# Patient Record
Sex: Male | Born: 1960
Health system: Southern US, Community
[De-identification: ages and names within clinical notes are randomized; demographics above are authoritative.]

## PROBLEM LIST (undated history)

## (undated) DIAGNOSIS — M1990 Unspecified osteoarthritis, unspecified site: Secondary | ICD-10-CM

## (undated) DIAGNOSIS — R269 Unspecified abnormalities of gait and mobility: Secondary | ICD-10-CM

## (undated) DIAGNOSIS — I639 Cerebral infarction, unspecified: Secondary | ICD-10-CM

## (undated) DIAGNOSIS — E785 Hyperlipidemia, unspecified: Secondary | ICD-10-CM

## (undated) DIAGNOSIS — F419 Anxiety disorder, unspecified: Secondary | ICD-10-CM

## (undated) DIAGNOSIS — Z8619 Personal history of other infectious and parasitic diseases: Secondary | ICD-10-CM

## (undated) HISTORY — DX: Personal history of other infectious and parasitic diseases: Z86.19

## (undated) HISTORY — PX: OTHER SURGICAL HISTORY: SHX169

## (undated) HISTORY — DX: Unspecified abnormalities of gait and mobility: R26.9

## (undated) HISTORY — PX: INGUINAL HERNIA REPAIR: SUR1180

## (undated) HISTORY — DX: Unspecified osteoarthritis, unspecified site: M19.90

## (undated) HISTORY — DX: Hyperlipidemia, unspecified: E78.5

## (undated) HISTORY — DX: Anxiety disorder, unspecified: F41.9

## (undated) HISTORY — PX: COLONOSCOPY: SHX174

---

## 2005-03-27 ENCOUNTER — Emergency Department (HOSPITAL_COMMUNITY): Admission: EM | Admit: 2005-03-27 | Discharge: 2005-03-27 | Payer: Self-pay | Admitting: Emergency Medicine

## 2005-03-28 ENCOUNTER — Emergency Department (HOSPITAL_COMMUNITY): Admission: EM | Admit: 2005-03-28 | Discharge: 2005-03-28 | Payer: Self-pay | Admitting: Emergency Medicine

## 2005-03-31 ENCOUNTER — Inpatient Hospital Stay (HOSPITAL_COMMUNITY): Admission: RE | Admit: 2005-03-31 | Discharge: 2005-04-14 | Payer: Self-pay | Admitting: Neurosurgery

## 2005-03-31 ENCOUNTER — Encounter: Payer: Self-pay | Admitting: Internal Medicine

## 2005-03-31 ENCOUNTER — Ambulatory Visit: Payer: Self-pay | Admitting: Infectious Diseases

## 2005-03-31 ENCOUNTER — Ambulatory Visit: Payer: Self-pay | Admitting: Dentistry

## 2005-03-31 ENCOUNTER — Ambulatory Visit: Payer: Self-pay | Admitting: Physical Medicine & Rehabilitation

## 2005-04-01 ENCOUNTER — Encounter (INDEPENDENT_AMBULATORY_CARE_PROVIDER_SITE_OTHER): Payer: Self-pay | Admitting: Interventional Cardiology

## 2005-04-14 ENCOUNTER — Inpatient Hospital Stay (HOSPITAL_COMMUNITY)
Admission: RE | Admit: 2005-04-14 | Discharge: 2005-05-13 | Payer: Self-pay | Admitting: Physical Medicine & Rehabilitation

## 2005-05-17 ENCOUNTER — Encounter
Admission: RE | Admit: 2005-05-17 | Discharge: 2005-08-15 | Payer: Self-pay | Admitting: Physical Medicine & Rehabilitation

## 2005-05-18 ENCOUNTER — Ambulatory Visit: Payer: Self-pay | Admitting: Nurse Practitioner

## 2005-05-18 ENCOUNTER — Ambulatory Visit: Payer: Self-pay | Admitting: *Deleted

## 2005-06-13 ENCOUNTER — Ambulatory Visit (HOSPITAL_COMMUNITY): Admission: RE | Admit: 2005-06-13 | Discharge: 2005-06-13 | Payer: Self-pay | Admitting: Neurosurgery

## 2005-06-22 ENCOUNTER — Encounter
Admission: RE | Admit: 2005-06-22 | Discharge: 2005-09-20 | Payer: Self-pay | Admitting: Physical Medicine & Rehabilitation

## 2005-07-05 ENCOUNTER — Ambulatory Visit: Payer: Self-pay | Admitting: Physical Medicine & Rehabilitation

## 2005-08-16 ENCOUNTER — Encounter
Admission: RE | Admit: 2005-08-16 | Discharge: 2005-11-14 | Payer: Self-pay | Admitting: Physical Medicine & Rehabilitation

## 2005-08-31 ENCOUNTER — Ambulatory Visit: Payer: Self-pay | Admitting: Physical Medicine & Rehabilitation

## 2005-10-24 ENCOUNTER — Encounter
Admission: RE | Admit: 2005-10-24 | Discharge: 2006-01-22 | Payer: Self-pay | Admitting: Physical Medicine & Rehabilitation

## 2005-10-24 ENCOUNTER — Ambulatory Visit: Payer: Self-pay | Admitting: Physical Medicine & Rehabilitation

## 2005-12-30 ENCOUNTER — Ambulatory Visit: Payer: Self-pay | Admitting: Physical Medicine & Rehabilitation

## 2006-02-20 ENCOUNTER — Encounter
Admission: RE | Admit: 2006-02-20 | Discharge: 2006-05-21 | Payer: Self-pay | Admitting: Physical Medicine & Rehabilitation

## 2006-03-28 ENCOUNTER — Emergency Department (HOSPITAL_COMMUNITY): Admission: EM | Admit: 2006-03-28 | Discharge: 2006-03-28 | Payer: Self-pay | Admitting: Emergency Medicine

## 2006-04-06 ENCOUNTER — Ambulatory Visit: Payer: Self-pay | Admitting: Physical Medicine & Rehabilitation

## 2006-07-27 ENCOUNTER — Encounter
Admission: RE | Admit: 2006-07-27 | Discharge: 2006-10-25 | Payer: Self-pay | Admitting: Physical Medicine & Rehabilitation

## 2006-07-27 ENCOUNTER — Ambulatory Visit: Payer: Self-pay | Admitting: Physical Medicine & Rehabilitation

## 2006-08-31 ENCOUNTER — Emergency Department (HOSPITAL_COMMUNITY): Admission: EM | Admit: 2006-08-31 | Discharge: 2006-08-31 | Payer: Self-pay | Admitting: Emergency Medicine

## 2006-10-19 ENCOUNTER — Ambulatory Visit: Payer: Self-pay | Admitting: Physical Medicine & Rehabilitation

## 2006-12-04 ENCOUNTER — Emergency Department (HOSPITAL_COMMUNITY): Admission: EM | Admit: 2006-12-04 | Discharge: 2006-12-04 | Payer: Self-pay | Admitting: Emergency Medicine

## 2007-02-06 ENCOUNTER — Ambulatory Visit: Payer: Self-pay | Admitting: Physical Medicine & Rehabilitation

## 2007-02-06 ENCOUNTER — Encounter
Admission: RE | Admit: 2007-02-06 | Discharge: 2007-02-07 | Payer: Self-pay | Admitting: Physical Medicine & Rehabilitation

## 2007-05-29 ENCOUNTER — Encounter
Admission: RE | Admit: 2007-05-29 | Discharge: 2007-05-30 | Payer: Self-pay | Admitting: Physical Medicine & Rehabilitation

## 2007-05-29 ENCOUNTER — Ambulatory Visit: Payer: Self-pay | Admitting: Physical Medicine & Rehabilitation

## 2007-09-17 ENCOUNTER — Encounter
Admission: RE | Admit: 2007-09-17 | Discharge: 2007-09-19 | Payer: Self-pay | Admitting: Physical Medicine & Rehabilitation

## 2007-09-19 ENCOUNTER — Ambulatory Visit: Payer: Self-pay | Admitting: Physical Medicine & Rehabilitation

## 2007-11-03 ENCOUNTER — Emergency Department (HOSPITAL_COMMUNITY): Admission: EM | Admit: 2007-11-03 | Discharge: 2007-11-03 | Payer: Self-pay | Admitting: Emergency Medicine

## 2007-12-27 ENCOUNTER — Encounter
Admission: RE | Admit: 2007-12-27 | Discharge: 2007-12-28 | Payer: Self-pay | Admitting: Physical Medicine & Rehabilitation

## 2007-12-28 ENCOUNTER — Ambulatory Visit: Payer: Self-pay | Admitting: Physical Medicine & Rehabilitation

## 2008-04-23 ENCOUNTER — Emergency Department (HOSPITAL_COMMUNITY): Admission: EM | Admit: 2008-04-23 | Discharge: 2008-04-23 | Payer: Self-pay | Admitting: Emergency Medicine

## 2008-05-16 ENCOUNTER — Encounter: Admission: RE | Admit: 2008-05-16 | Discharge: 2008-05-20 | Payer: Self-pay | Admitting: Anesthesiology

## 2008-05-20 ENCOUNTER — Ambulatory Visit: Payer: Self-pay | Admitting: Anesthesiology

## 2008-05-27 ENCOUNTER — Ambulatory Visit (HOSPITAL_COMMUNITY): Admission: RE | Admit: 2008-05-27 | Discharge: 2008-05-27 | Payer: Self-pay | Admitting: Anesthesiology

## 2008-08-20 ENCOUNTER — Emergency Department (HOSPITAL_COMMUNITY): Admission: EM | Admit: 2008-08-20 | Discharge: 2008-08-20 | Payer: Self-pay | Admitting: Emergency Medicine

## 2008-08-22 ENCOUNTER — Emergency Department (HOSPITAL_COMMUNITY): Admission: EM | Admit: 2008-08-22 | Discharge: 2008-08-22 | Payer: Self-pay | Admitting: Family Medicine

## 2008-08-27 ENCOUNTER — Encounter: Admission: RE | Admit: 2008-08-27 | Discharge: 2008-09-30 | Payer: Self-pay | Admitting: Anesthesiology

## 2008-11-15 ENCOUNTER — Emergency Department (HOSPITAL_COMMUNITY): Admission: EM | Admit: 2008-11-15 | Discharge: 2008-11-15 | Payer: Self-pay | Admitting: Emergency Medicine

## 2008-12-03 ENCOUNTER — Encounter: Admission: RE | Admit: 2008-12-03 | Discharge: 2008-12-03 | Payer: Self-pay | Admitting: Neurosurgery

## 2008-12-14 ENCOUNTER — Emergency Department (HOSPITAL_COMMUNITY): Admission: EM | Admit: 2008-12-14 | Discharge: 2008-12-14 | Payer: Self-pay | Admitting: Emergency Medicine

## 2009-02-25 ENCOUNTER — Emergency Department (HOSPITAL_COMMUNITY): Admission: EM | Admit: 2009-02-25 | Discharge: 2009-02-25 | Payer: Self-pay | Admitting: Emergency Medicine

## 2009-04-04 ENCOUNTER — Emergency Department (HOSPITAL_COMMUNITY): Admission: EM | Admit: 2009-04-04 | Discharge: 2009-04-04 | Payer: Self-pay | Admitting: Emergency Medicine

## 2009-04-22 ENCOUNTER — Emergency Department (HOSPITAL_COMMUNITY): Admission: EM | Admit: 2009-04-22 | Discharge: 2009-04-22 | Payer: Self-pay | Admitting: Emergency Medicine

## 2009-07-22 ENCOUNTER — Emergency Department (HOSPITAL_COMMUNITY): Admission: EM | Admit: 2009-07-22 | Discharge: 2009-07-22 | Payer: Self-pay | Admitting: Emergency Medicine

## 2009-12-20 ENCOUNTER — Emergency Department (HOSPITAL_COMMUNITY): Admission: EM | Admit: 2009-12-20 | Discharge: 2009-12-20 | Payer: Self-pay | Admitting: Emergency Medicine

## 2010-01-05 ENCOUNTER — Ambulatory Visit: Payer: Self-pay | Admitting: Diagnostic Radiology

## 2010-01-05 ENCOUNTER — Emergency Department (HOSPITAL_BASED_OUTPATIENT_CLINIC_OR_DEPARTMENT_OTHER)
Admission: EM | Admit: 2010-01-05 | Discharge: 2010-01-05 | Payer: Self-pay | Source: Home / Self Care | Admitting: Emergency Medicine

## 2010-02-16 ENCOUNTER — Encounter
Admission: RE | Admit: 2010-02-16 | Discharge: 2010-03-18 | Payer: Self-pay | Source: Home / Self Care | Attending: Neurosurgery | Admitting: Neurosurgery

## 2010-02-27 ENCOUNTER — Emergency Department (HOSPITAL_BASED_OUTPATIENT_CLINIC_OR_DEPARTMENT_OTHER): Admission: EM | Admit: 2010-02-27 | Discharge: 2010-02-27 | Payer: Self-pay | Admitting: Emergency Medicine

## 2010-03-24 ENCOUNTER — Encounter
Admission: RE | Admit: 2010-03-24 | Discharge: 2010-03-26 | Payer: Self-pay | Source: Home / Self Care | Attending: Neurosurgery | Admitting: Neurosurgery

## 2010-03-26 ENCOUNTER — Emergency Department (HOSPITAL_BASED_OUTPATIENT_CLINIC_OR_DEPARTMENT_OTHER)
Admission: EM | Admit: 2010-03-26 | Discharge: 2010-03-26 | Payer: Self-pay | Source: Home / Self Care | Admitting: Emergency Medicine

## 2010-03-29 ENCOUNTER — Encounter
Admission: RE | Admit: 2010-03-29 | Discharge: 2010-04-27 | Payer: Self-pay | Source: Home / Self Care | Attending: Neurosurgery | Admitting: Neurosurgery

## 2010-03-31 ENCOUNTER — Encounter
Admission: RE | Admit: 2010-03-31 | Discharge: 2010-03-31 | Payer: Self-pay | Source: Home / Self Care | Attending: Neurosurgery | Admitting: Neurosurgery

## 2010-04-18 ENCOUNTER — Encounter: Payer: Self-pay | Admitting: Neurosurgery

## 2010-04-18 ENCOUNTER — Encounter: Payer: Self-pay | Admitting: Physical Medicine & Rehabilitation

## 2010-06-09 DIAGNOSIS — G8190 Hemiplegia, unspecified affecting unspecified side: Secondary | ICD-10-CM | POA: Insufficient documentation

## 2010-06-10 DIAGNOSIS — N319 Neuromuscular dysfunction of bladder, unspecified: Secondary | ICD-10-CM | POA: Insufficient documentation

## 2010-06-10 DIAGNOSIS — R32 Unspecified urinary incontinence: Secondary | ICD-10-CM | POA: Insufficient documentation

## 2010-07-06 LAB — GLUCOSE, CAPILLARY: Glucose-Capillary: 110 mg/dL — ABNORMAL HIGH (ref 70–99)

## 2010-07-07 DIAGNOSIS — N529 Male erectile dysfunction, unspecified: Secondary | ICD-10-CM | POA: Insufficient documentation

## 2010-07-07 DIAGNOSIS — L84 Corns and callosities: Secondary | ICD-10-CM | POA: Insufficient documentation

## 2010-08-10 NOTE — Assessment & Plan Note (Signed)
[name] returns to the clinic today for followup evaluation. He  reports that he is not getting much help from the Flexeril use three  times per day. He has occasional severe spasms in his right leg which  causes him to occasionally fall. He would like to try a different muscle  relaxant. In terms of his tingling in his hands, that sensation has  stopped and he has stopped taking his Neurontin completely.   He does need a refill on his hydrocodone over the next few days.   REVIEW OF SYSTEMS:  Noncontributory.   MEDICATIONS:  1. Flexeril 10 mg t.i.d. p.r.n.  2. Hydrocodone 7.5/325 one tablet t.i.d. p.r.n.   PHYSICAL EXAMINATION:  Well-appearing, fit, adult male in mild acute  discomfort. He ambulates with a single point cane in his right upper  extremity. Blood pressure is 137/63 with a pulse of 88. Respiratory rate  is 16. O2 saturation is 99% on room air. He has 4+/5 strength throughout  the bilateral upper and lower extremities.   IMPRESSION:  1. Status post cervical abscess along with discitis and cervical      myelopathy, status post evacuation of epidural abscess C6-C7      January 2007.  2. Persistent left upper and left lower extremity weakness/spasticity,      improved.  3. Pain of the right ankle and right great toe, improved.   In the office today, we did refill the patient's hydrocodone as of the  22 of November. We will also start him on Robaxin 500 mg t.i.d. p.r.n.  in place of his Flexeril. No refill on the Neurontin is necessary as he  has stopped that medication completely. Will plan on seeing the patient  in followup in approximately four months time with refills prior to that  appointment as necessary.           ______________________________  Ellwood Dense, M.D.     DC/MedQ  D:  02/07/2007 09:17:51  T:  02/07/2007 14:47:03  Job #:  [id number]

## 2010-08-10 NOTE — Assessment & Plan Note (Signed)
Danny Hopkins returns to the clinic today for followup evaluation.  He  reports that he still has no primary care physician.  He has only  Medicaid, is not sure who would accept his insurance.  We have been  using pain medicines for his chronic low back pain.  He still uses the  hydrocodone, approximately 2 to 3 tablets per day, along with Robaxin on  an as-needed basis.  Does complain of increased tingling of his left  hand and left leg and lower leg on the left side.  He had been on  Neurontin in the past and would like to restart that medication for the  increased tingling.  He still reports that he gets stiff whenever he  sits for a prolonged period of time.  He does not need a refill on the  Robaxin or hydrocodone, but does need a new prescription for the  Neurontin.   REVIEW OF SYSTEMS:  Positive for constipation and night sweats.   MEDICATIONS:  1. Robaxin p.r.n.  2. Hydrocodone 7.5/325 one tablet t.i.d. p.r.n.   PHYSICAL EXAMINATION:  A reasonably well-appearing, fit adult male in  mild acute discomfort.  Blood pressure is 105/69 with a pulse of 61, respiratory rate 18, and O2  saturation 98% on room air.  He ambulates with an antalgic gait with stiffness in his low back.  He has 4+/5 strength throughout the bilateral upper and lower  extremities.   IMPRESSION:  1. Status post cervical abscess along with diskitis and cervical      myelopathy, status post evacuation of epidural abscess, C6-C7,      January 2007.  2. Persistent left upper and left lower extremity      weakness/spasticity/tingling.  3. Pain at the right ankle and right great toe, improved.   In the office today, no refill on the hydrocodone or Robaxin is  necessary.  We did restart him on Neurontin 100 mg b.i.d. with a new  prescription with 3 refills.  We will plan on seeing him in followup in  approximately 3 months' time and adjust his Neurontin at that time.  I  still have encouraged him to call around  to see who would be able to  follow his primary care needs, now and in the future.  He will need to  let them know that he has Medicaid before they can set up an  appointment.           ______________________________  Ellwood Dense, M.D.     DC/MedQ  D:  05/30/2007 09:26:49  T:  05/30/2007 10:24:25  Job #:  [id number]

## 2010-08-10 NOTE — Assessment & Plan Note (Signed)
Danny Hopkins returns to clinic today for followup evaluation.  He  reports that he has had some increased tingling of his finger and toes.  He apparently has stopped the Neurontin as he did not get a refill on  that medication.  He had been taking 300 mg t.i.d. but is not taking the  medication at all at this point.  He does report that he gets reasonable  relief from the hydrocodone used 3 times per day.  He does need a refill  on the hydrocodone and a restart of the Neurontin.   REVIEW OF SYSTEMS:  Noncontributory.   MEDICATIONS:  1. Flexeril 10 mg p.o. t.i.d.  2. Hydrocodone 7.5/325 one tablet t.i.d.  p.r.n.   PHYSICAL EXAMINATION:  A well-appearing adult male in mild to no acute  discomfort.  Blood pressure 123/82 with a pulse of 77. Respiratory rate 17 and O2  saturation 98% on room air.  The patient is 4+/5 strength at the bilateral, upper and lower  extremities.   IMPRESSION:  1. Status post cervical abscess along with diskitis and cervical      myelopathy, status post evacuation of epidural abscess C6-03 April 2005.  2. Persistent left upper and left lower extremity weakness/spasticity,      improving.  3. Pain in the right ankle and left great toe.   In the office today, we did refill the patient's hydrocodone.  We also  restarted him on Neurontin 300 mg 1 tablet p.o. nightly for 5 days, then  1 p.o. b.i.d.  for 5 days, then back to the maintenance level of 1  tablet p.o. t.i.d. for a total of 900 mg daily.  We will plan on seeing  the patient in follow up in this office in approximately 3 to 4 months'  time with refills prior to that appointment as necessary.           ______________________________  Danny Hopkins, M.D.     DC/MedQ  D:  10/20/2006 09:44:20  T:  10/20/2006 15:05:22  Job #:  [id number]

## 2010-08-10 NOTE — Assessment & Plan Note (Signed)
HISTORY:  Mr. Haden returns to the clinic today for followup  evaluation.  He reports that he is doing well overall.  He continues to  use minimal amounts of hydrocodone, i.e., 0-3 tablets per day and  Robaxin, the same, 0-3 tablets per day.  He does have a sufficient  supply of the hydrocodone and Robaxin in the office today.  He does  request a prescription of Cialis for erectile dysfunction.  We had  previously prescribed 10-mg tablets and he had to use up to 2 to get any  response.  He would like to have increased dose if possible.   MEDICATIONS:  1. Robaxin 500 mg t.i.d. p.r.n.  2. Hydrocodone 7.5/325 one tablet t.i.d. p.r.n. (0-3 per day).  3. Cialis p.r.n.   REVIEW OF SYSTEMS:  Positive for erectile dysfunction.   PHYSICAL EXAMINATION:  GENERAL:  Well-appearing fit adult male in mild-  to-no acute discomfort.  VITAL SIGNS:  Blood pressure 115/64 with a pulse of 60, respiratory rate  18, and O2 saturation 96% on room air.  MUSCULOSKELETAL:  The patient has 4+ to 5-/5 strength throughout the  bilateral upper and lower extremities.  He ambulates without any  assistive device.  He has decreased range of motion of his lumbar and  cervical spine.   IMPRESSION:  1. Status post cervical abscess along with diskitis and cervical      myelopathy, status post evacuation of epidural abscess C6-C7,      January 2007.  2. Persistent left upper and left lower extremity      weakness/spasticity/tingling.  3. Right ankle and right great toe pain, improved.   In the office today, no refill on the Robaxin or hydrocodone is  necessary.  We did give him a prescription for Cialis to be used 20 mg 1  tablet daily p.r.n., a total of 10, as of January 07, 2008.  We will  plan on seeing the patient in followup in this office in approximately 4  months' time with refills prior to that appointment as necessary.           ______________________________  Ellwood Dense, M.D.     DC/MedQ  D:   12/28/2007 12:13:14  T:  12/29/2007 01:30:27  Job #:  [id number]

## 2010-08-10 NOTE — Assessment & Plan Note (Signed)
Danny [facility] comes to [facility] today.  I evaluated him  and reviewed the health and history form and 14-point review of systems,  reviewed the chart.   1. Progress to date overall directed care approach.  An individual      with longstanding pain, secondary to cord compression, he has upper      arm and left with muscle wasting, decreased grip, and progressive      and emerging low back pain described as spondylitic, he has an      evolving radicular element.  He has not had an MRI of his low back.  2. Modifiable features in health profile discussed, full informed      consent UDS, review medication and progress to date.  3. His medication is reviewed, and I have gone over with him with      legitimate medical need assessed.  He is using hydrocodone      sparingly, and I have reviewed that medication with him.  Perhaps      with interventions and multimodality approach we can consider      moving to a p.r.n., nonnarcotic options, and he is open to that.   OBJECTIVE:  Diffuse paralumbar myofascial discomfort.  Fortin test  positive.  Side bending range of motion impaired secondary to pain.  His  left upper extremity reveals decreased grip strength.   There is some forearm wasting and he has thenar evidence of radial  myelopathy, suprascapular, midscapular pain.  Pain with extension, side  bending in the lumbar region.  Straight leg right and left forward test  positive.  Particularly to the right.  The EHL is fine.  He has brisk  reflex, nothing new neurologically.   IMPRESSION:  Degenerative spine disease, lumbar spine with radicular  symptoms.   PLAN:  1. Lumbar MRI.  2. Consider interventions.  3. Opioid consent.  The patient's care agreement reviewed.  I will go      ahead and put him on hydrocodone.  4. Questions were answered, discussed in lay terms and I will see him      in followup.            ______________________________  Celene Kras, MD     HH/MedQ  D:  05/20/2008 17:21:04  T:  05/21/2008 04:46:25  Job #:  [id number]

## 2010-08-10 NOTE — Assessment & Plan Note (Signed)
Danny Hopkins returns to the clinic today for followup evaluation.  He  reports that he continues to use his hydrocodone anywhere from 0-3  tablets per day.  He has had a recent refill on that medication.  He  also has a sufficient supply of Robaxin at this point.  He continues to  try to be as active as possible at home and also periodically doing  things with his nephew.   The patient is requesting medication for erectile dysfunction.  He  reports that that has been a problem that he is noticed over the past  several months, and this seems to have worsened.   REVIEW OF SYSTEMS:  Positive for erectile dysfunction.   MEDICATIONS:  1. Robaxin 500 mg t.i.d. p.r.n.  2. Hydrocodone 7.5/325 one tablet t.i.d. p.r.n. (0-3 per day).   PHYSICAL EXAMINATION:  GENERAL:  Well-appearing, fit, adult male, in  mild-to-no acute discomfort.  VITAL SIGNS:  Blood pressure 107/68 with the pulse of 59, respiratory  rate 18, and O2 saturation 99% on room air.  EXTREMITIES:  He has 4+ to 5-/5 strength throughout the bilateral upper  and lower extremities.  He ambulates without any assistive device.   IMPRESSION:  1. Status post cervical abscess along with discitis and cervical      myelopathy, status post evacuation of epidural abscess C6-C7 in      January 2007.  2. Persistent left upper and left lower extremity      weakness/spasticity/tingling.  3. Pain in the right ankle and right great toe, improved.   No refill on the Robaxin or hydrocodone is necessary.  We did give him a  new prescription for Cialis 10 mg to be used 1-2 tablets p.o. daily  p.r.n. total of 20 with 1 refill.  We will plan on seeing him in  followup at approximately 4 months' time with refills prior to that  appointment as necessary.           ______________________________  Danny Hopkins, M.D.     DC/MedQ  D:  09/19/2007 09:52:01  T:  09/20/2007 06:20:30  Job #:  [id number]

## 2010-08-13 NOTE — Discharge Summary (Signed)
NAMEGEREMY, RISTER            ACCOUNT NO.:  [id number]   MEDICAL RECORD NO.:  [id number]          PATIENT TYPE:  INP   LOCATION:  3038                         FACILITY:  [facility]   PHYSICIAN:  Kathaleen Maser. Pool, M.D.    DATE OF BIRTH:  July 11, 1960   DATE OF ADMISSION:  03/31/2005  DATE OF DISCHARGE:  04/14/2005                                 DISCHARGE SUMMARY   SERVICE:  Neurosurgery.   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   OPERATION/TREATMENT:  1.  C6 corpectomy with C5-C7 strut graft fusion with instrumentation.  2.  IV antibiotics.   HISTORY OF PRESENT ILLNESS:  Mr. Peretti is a 50 year old male who has  severe neck pain and progressive quadriparesis.  A workup had demonstrated  what appears to be a spontaneous epidural hematoma, although abscess can not  be ruled out.  The patient has marked spinal cord dysfunction and is taken  to the operating room urgently for decompression.   HOSPITAL COURSE:  The patient underwent a C6 corpectomy with fusion from C5-  C7.  Postoperatively, the patient awakened with a further decline in his  neurological function.  He no longer had spontaneous movement of his lower  extremities.  Upper extremity strength was essentially stable.  The patient  was treated with antibiotics.  Cultures grew staph aureus sensitive to  methicillin.  He was placed on Ancef for this.  Followup MRI scanning and CT  scanning demonstrated good decompression of the spinal cord with no evidence  of further problems.  The patient gradually improved.  He began having  spontaneous movements in his lower extremities, right greater than left.  He  progressed to antigravity strength.  He was making good gains with physical  therapy.  He is to be discharged to the rehab medical service.   CONDITION ON DISCHARGE:  Improving.   DISCHARGE DISPOSITION:  The patient is to remain in an external brace.  He  is to continue  with IV antibiotics.  I will continue to follow him while in  the rehab unit.           ______________________________  Kathaleen Maser. Pool, M.D.     HAP/MEDQ  D:  07/26/2005  T:  07/26/2005  Job:  [id number]
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
## 2010-08-13 NOTE — Assessment & Plan Note (Signed)
HISTORY OF PRESENT ILLNESS:  Mr. Ozaki returns to the clinic today for  followup evaluation. Overall, he is doing well. He continues to attend  outpatient physical and occupational therapy at [facility] twice a week.  He is ambulating with a single point cane.   The main problems that he reports are involving this bowel and bladder and  pain. He reports tightness in his low back that has been steady over the  past 3 weeks or so. He is not getting much relief from the Baclofen. He also  reports that he has had occasional incontinence of bladder and reports that  he has constant pressure in his lower abdomen. He also reports that he has  occasional acute loss of continence of bowel but with bowel movements  occurring only every 3 to 4 days.   MEDICATIONS:  1.  Baclofen 10 mg t.i.d.  2.  Tylenol p.r.n.   REVIEW OF SYSTEMS:  Noncontributory.   PHYSICAL EXAMINATION:  GENERAL:  A reasonably well appearing thin adult male  in mild acute discomfort.  VITAL SIGNS:  Blood pressure 116/57 with a pulse of 58, respiratory rate 16,  and O2 saturation 99% on room air.  NEUROLOGIC:  Right upper and left upper extremity strength were 4+ and 3+  over 5, respectively. Right lower extremity strength was 5 minus over 5 and  left lower extremity strength was 4 minus over 5 in hip flexion. He  ambulates with a single point cane. Bulk and tone were normal.   IMPRESSION:  1.  Status post cervical abscess along with diskitis and cervical myelopathy      with evacuation of epidural abscess in C6-7  fusion March 31, 2005.  2.  Persistent left upper and left lower extremity weakness.  3.  Spasms of upper abdomen and low back periodically.   PLAN:  1.  For his low back pain, we have recommended that he stop his Baclofen and      instead, use the Robaxin 500 mg 1 tablet p.o. t.i.d., written for him in      the office today.  2.  In terms of his bladder, we have written for Ditropan XL 4 mg 1 tablet    daily, to see if we can help with his constant pressure to urinate.  3.  For his bowels, I have given him a script for Senokot-S, which is a      laxative that he can get over-the-counter. He can follow the      instructions on the Senokot packaging.  4.  We will plan on seeing the patient in followup in this office in      approximately 2 months time with refills of medications prior to that      appointment as necessary. He will continue in outpatient therapy at this      point with likely good progress to be made.           ______________________________  Ellwood Dense, M.D.     DC/MedQ  D:  09/01/2005 10:33:25  T:  09/01/2005 22:05:54  Job #:  [id number]

## 2010-08-13 NOTE — Assessment & Plan Note (Signed)
Danny Hopkins returns to clinic today slightly earlier than previously  scheduled.  He is reporting a fair amount of pain in his back and hips and  legs, and he feels it may be related to some of the weather changes.  He has  been using the Robaxin but not getting much relief.  He did try a recent  tablet of Vicodin from a friend of his and reports that he got a good bit of  relief.  He is wondering if he could use that on a minimal daily basis for  his pain.  He also plans to start living up in [place] for the next month  or so and plans to return to the local area over the next several weeks.   MEDICATIONS:  Robaxin 500 mg t.i.d. p.r.n.   REVIEW OF SYSTEMS:  Positive for urine retention.   PHYSICAL EXAMINATION:  GENERAL:  A reasonably well-appearing thin adult male  in mild acute discomfort.  VITAL SIGNS:  Blood pressure 114/80 with pulse 65, respiratory rate 16, and  O2 saturation 99% on room air.  MUSCULOSKELETAL/NEUROLOGIC:  He has 4+ to 5-/5 strength throughout the  bilateral upper and lower extremities.  He ambulates without any assistive  device.   IMPRESSION:  1. Status post cervical abscess along with discitis and cervical      myelopathy, status post evacuation of epidural abscess and effusion at      C6-03 April 2005.  2. Persistent left upper and left lower extremity weakness/spasticity,      improved.  3. Bladder incontinence, resolved.   In the office today we did have him discontinue his Robaxin and start  Vicodin 5/325 mg one tablet t.i.d. p.r.n., a total of 90 were prescribed.  He will be using minimal amounts of this and has been compliant with all  medicines in the past.  I expect that we will have no problems with him on a  narcotic medication.  We will plan to see him in follow-up in approximately  3 months' time with refills prior to that appointment as necessary.           ______________________________  Ellwood Dense, M.D.     DC/MedQ  D:   01/02/2006 10:58:52  T:  01/03/2006 11:22:26  Job #:  [id number]
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
## 2010-08-13 NOTE — Assessment & Plan Note (Signed)
[name] returns to clinic today for follow-up evaluation.  He is  reporting that the change from baclofen to Robaxin is giving him reasonable  relief from his spasms.  He had also used the Ditropan-XL for a short period  of time for bladder incontinence, but has been able to control that  completely off the medicine.  He also stopped laxatives.   The patient has been applying for disability, but has been turned down x2.  He is working with the lawyer at the present time.   MEDICATIONS:  Robaxin 500 mg t.i.d. p.r.n.   REVIEW OF SYSTEMS:  Noncontributory.   PHYSICAL EXAMINATION:  GENERAL:  A reasonably well-appearing thin adult male  in mild acute discomfort.  VITAL SIGNS:  Blood pressure of 98/68, with a pulse of 68, respiratory rate  16, and O2 saturation 95% on room air.  MUSCULOSKELETAL:  Right upper and left upper extremity strength were 4+ and  3+ over 5, respectively.  The right lower extremity strength was 5-/5, and  the left lower extremity strength was 4-/5.  He ambulates with a single-  point cane.   IMPRESSION:  1.  Status post cervical abscess, along with discitis and cervical      myelopathy, with evacuation of epidural abscess and fusion at C6-7, in      January of 2007.  2.  Persistent left upper and left lower extremity weakness/spasticity.  3.  Bladder incontinence, resolved.   In the office today, no refill on medications is necessary.  He continues to  try to be as active as possible, using a single-point cane for ambulation.  He is still in the process of trying to apply for disability.  Will plan on  seeing the patient in follow up in approximately 3-4 months' time.           ______________________________  Ellwood Dense, M.D.     DC/MedQ  D:  10/27/2005 11:18:30  T:  10/27/2005 11:44:03  Job #:  [id number]

## 2010-08-13 NOTE — Op Note (Signed)
Danny Hopkins, Danny Hopkins            ACCOUNT NO.:  [id number]   MEDICAL RECORD NO.:  [id number]          PATIENT TYPE:  INP   LOCATION:  2852                         FACILITY:  [facility]   PHYSICIAN:  Kathaleen Maser. Pool, M.D.    DATE OF BIRTH:  1960/08/16   DATE OF PROCEDURE:  03/31/2005  DATE OF DISCHARGE:                                 OPERATIVE REPORT   PREOPERATIVE DIAGNOSES:  Left C5-6, acute cervical disk herniation with  myelopathy and associated epidural hematoma.   POSTOPERATIVE DIAGNOSES:  C5-6 discitis with epidural abscess and severe  myelopathy.   PROCEDURE:  1.  C6 corpectomy with evacuation of epidural abscess.  2.  C5-C7 strut graft fusion utilizing fibular allograft and anterior plate      instrumentation.   SURGEON:  Kathaleen Maser. Pool, M.D.   ASSISTANT:  Tia Alert, MD   ANESTHESIA:  General endotracheal.   INDICATIONS FOR PROCEDURE:  Danny Hopkins is a 50 year old black male with a  5-day history of severe neck pain with radiation into his left scapula and  progressively into his left upper extremity.  The patient was evaluated a  number of times at the [facility] Emergency Room where workup for cause of  his problems had been nondiagnostic.  The patient presented late last night  with continued complaints of pain and now with worsening numbness and  paresthesias extending to his left upper extremity, and now involving his  left lower extremity.  Progressively through the course of the night the  patient became severe weak in his left upper extremity and now no longer has  voluntary motor function in his left lower extremity.  He had minimal  antigravity strength in his right lower extremity.  An urgent MRI scan this  morning was done which demonstrated evidence of a left-sided C5-6 extradural  mass with severe spinal cord compression consistent with an acute cervical  disk herniation.  There was high signal tracking behind the body of C6 which  appeared to be most  consistent with an associated epidural hematoma.  The  patient was counseled as to his options. I have recommended that we proceed  emergently with a C-6 corpectomy for evacuation of this lesion.  The  patient, otherwise, is in good health; has no history of trauma.   OPERATIVE NOTE:  The patient is brought to the operating room and placed on  the table in the supine position. After an adequate level of anesthesia was  achieved, the patient was placed supine with his neck slightly extended and  held in place with the halter traction.  The patient's anterior cervical  region was prepped and draped sterilely. A #10 blade was used to make a  linear skin incision overlying the C6 vertebral level.  This was carried  down sharply to the platysma.  The platysma was then divided vertically and  dissection proceeded along the medial border of the sternocleidomastoid  muscle and carotid sheath.  The trachea and esophagus were mobilized and  retracted towards the left.   The longus coli muscles, themselves, were quite edematous and the anterior  longitudinal  ligament was also thickened.  The disk space at C5-6 was  disrupted.  The disk space at C6-7 was also identified.  Both disk spaces  were then incised with a 15-blade in a standard fashion and a wide disk  space plane was then achieved using pituitary rongeurs, upward angled and  backward angle Carlen curets, Kerrison rongeurs and a high-speed drill.  Microscope was brought into the field.  Starting first at the C5-6 the  remaining aspects of osteophytes and annulus were removed down to the level  of the posterior longitudinal ligament.  Posterior ligament was then  elevated and resected in piece meal fashion using Kerrison rongeurs.  The  posterior longitudinal ligament was very reactive and inflamed.  Pus was  encountered more off towards the left side.  This was cultured.  A wide  discectomy was then performed extending into each neural  foramen.  The C6  nerve roots were identified proximally and found to be free of any further  compression.  All elements of the abscess at this level was drained.  A  diskectomy at C6-7 was then performed in a similar fashion.  A corpectomy of  the body of C6 was then performed using Weckcel rongeurs and the high-speed  drill.  The remaining posterior cortex of bone was then removed using  Kerrison rongeurs.  The reactive posterior longitudinal ligament was also  removed.  The dura, itself, was thickened and somewhat hyperemic.   The wound was then copiously irrigated with antibiotic solution. A small  amount of Gelfoam was placed topically for hemostasis.  A piece of fibular  strut allograft was then cut to an appropriate length and then impacted into  place with the corpectomy site distracted using Caspar pins.  A 45-mm  Atlantis anterior cervical plate was then placed over the C5 and C7 bodies.  This was then attached under fluoroscopic guidance using 13-mm variable  angle screws, two each at both levels.  Final tightening was given to all  four screws.  A locking screw was engaged at all three levels.  The wound  was then irrigated with antibiotic solution.  Final images revealed good  position of bone grafts and hardware at the proper operative level with  normal alignment of the spine.  The wound was inspected for hemostasis.  It  was then closed in typical fashion.  Steri-Strips and a sterile dressing  were applied. The patient returns to the recovery room in very serious  condition.           ______________________________  Kathaleen Maser Pool, M.D.     HAP/MEDQ  D:  03/31/2005  T:  03/31/2005  Job:  [id number]
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
## 2010-08-13 NOTE — Assessment & Plan Note (Signed)
FOLLOW-UP OFFICE NOTE   SUBJECTIVE:  [name] returns to clinic today for followup  evaluation.  He reports that occasionally whenever he is seated or lying  for a prolonged period of time, he has some weakness in his right leg  when he first gets up.  That seems to improve after just a few steps but  the weakness that he has is associated with pain across the top of  his  right ankle.  Once he is moving, the pain resolves and his strength is  fairly normal.  He does use a single point cane because he has had  recent near falls related to this pain and weakness.  He continues to  take his Neurontin along with his Flexeril and Vicodin.  He reports that  he gets a lot of help with the addition of the recent Vicodin.   MEDICATIONS:  1. Neurontin 300 mg t.i.d.  2. Flexeril 10 mg t.i.d. p.r.n.  3. Vicodin 7.5/325 one tablet t.i.d. p.r.n.   REVIEW OF SYSTEMS:  Positive for constipation.   PHYSICAL EXAMINATION:  GENERAL:  Well-appearing, thin, adult male in  mild acute discomfort.  VITAL SIGNS:  Blood pressure 119/76, pulse 80, respiratory rate 16, O2  saturation 100% on room air.  EXTREMITIES:  Patient has 4/5 strength throughout the left upper and  left lower extremity.  Right-sided strength was 4+/5.   IMPRESSION:  1. Status post cervical abscess along with diskitis and cervical      myelopathy, status post evacuation of epidural abscess C6-7 in      January of 2007.  2. Persistent left upper and left lower extremity weakness/spasticity,      improving.  3. Bladder incontinence, resolved.  4. Pain of the right ankle.  5. Pain of the left great toe.   PLAN:  In the office today, we did refill the patient's hydrocodone.  He  has a sufficient supply of Flexeril and Neurontin at this time.  Will  plan on seeing the patient in followup in approximately three months  time with refill of medication prior to this appointment if necessary.           ______________________________  Ellwood Dense, M.D.     DC/MedQ  D:  07/28/2006 09:30:09  T:  07/28/2006 10:15:53  Job #:  [id number]

## 2010-08-13 NOTE — Assessment & Plan Note (Signed)
Danny Hopkins returns to the clinic ahead of schedule for evaluation of  his left great toe.  He apparently developed spontaneous onset of severe  pain with burning of his left great toe and partial second toe.  He went  in to the emergency room March 28, 2006, and was prescribed Neurontin  and Flexeril.  Those both seemed to be giving him some relief, as he  feels that he can walk on it better, but he still has significant limp.  The tenderness is much less severe.  He has never had a particular  diagnosis of gout, and the joint is not swollen.  It is tender to touch,  but that has improved over the past few days.   MEDICATIONS:  1. Neurontin 300 mg b.i.d.  2. Flexeril 10 mg t.i.d. p.r.n.   REVIEW OF SYSTEMS:  Positive for constipation and night sweats.   PHYSICAL EXAMINATION:  Well-appearing, thin adult male, in moderate-to-  acute discomfort involving his left great toe.  Blood pressure was 131/74, pulse 70, respiratory rate 16, and O2  saturation 100% on room air.  Examination of his left great toe shows no particular swelling.  It is  tender to light touch, but I am able to at least touch it.  He reports  that was impossible back at the early part of the year.  He is able to  put weight on it, but uses a bedroom slipper to walk.   IMPRESSION:  1. Status post cervical abscess along with diskitis and cervical      myelopathy, status post evacuation of epidural abscess and      effusions C6-7, January, 2007.  2. Persistent left upper and left lower extremity weakness/spasticity,      improved.  3. Bladder incontinence, resolved.  4. Pain of the left great toe.   In the office today, we did refill the patient's Flexeril and increased  his Neurontin up to 300 mg t.i.d.  We will see how that goes over the  next few weeks, but it seems to be improving on it's own.  There is no  sign of any particular swelling, nor is there any redness.  We will plan  on seeing the patient in  followup in approximately 3-4 months' time.  He  is interested in restarting outpatient therapies, and we will let his  toe heal up before we start the therapy.  He does have some constipation  problems in the office today, and that should respond with over-the-  counter laxatives.           ______________________________  Ellwood Dense, M.D.     DC/MedQ  D:  04/07/2006 10:38:42  T:  04/07/2006 11:30:39  Job #:  [id number]

## 2011-04-07 ENCOUNTER — Other Ambulatory Visit (HOSPITAL_COMMUNITY): Payer: Self-pay | Admitting: Neurosurgery

## 2011-04-07 DIAGNOSIS — M4802 Spinal stenosis, cervical region: Secondary | ICD-10-CM | POA: Diagnosis not present

## 2011-04-07 DIAGNOSIS — M542 Cervicalgia: Secondary | ICD-10-CM

## 2011-04-11 ENCOUNTER — Ambulatory Visit (HOSPITAL_COMMUNITY)
Admission: RE | Admit: 2011-04-11 | Discharge: 2011-04-11 | Disposition: A | Discharge status: Home / Self Care | Payer: Medicare Other | Source: Ambulatory Visit | Attending: Neurosurgery | Admitting: Neurosurgery

## 2011-04-11 DIAGNOSIS — G9589 Other specified diseases of spinal cord: Secondary | ICD-10-CM | POA: Insufficient documentation

## 2011-04-11 DIAGNOSIS — R209 Unspecified disturbances of skin sensation: Secondary | ICD-10-CM | POA: Insufficient documentation

## 2011-04-11 DIAGNOSIS — M542 Cervicalgia: Secondary | ICD-10-CM

## 2011-04-11 DIAGNOSIS — B958 Unspecified staphylococcus as the cause of diseases classified elsewhere: Secondary | ICD-10-CM | POA: Diagnosis not present

## 2011-04-22 DIAGNOSIS — M4712 Other spondylosis with myelopathy, cervical region: Secondary | ICD-10-CM | POA: Diagnosis not present

## 2011-06-09 DIAGNOSIS — N529 Male erectile dysfunction, unspecified: Secondary | ICD-10-CM | POA: Diagnosis not present

## 2011-06-09 DIAGNOSIS — F329 Major depressive disorder, single episode, unspecified: Secondary | ICD-10-CM | POA: Diagnosis not present

## 2011-06-09 DIAGNOSIS — G894 Chronic pain syndrome: Secondary | ICD-10-CM | POA: Diagnosis not present

## 2011-06-09 DIAGNOSIS — E559 Vitamin D deficiency, unspecified: Secondary | ICD-10-CM | POA: Diagnosis not present

## 2011-06-22 DIAGNOSIS — M4802 Spinal stenosis, cervical region: Secondary | ICD-10-CM | POA: Diagnosis not present

## 2011-07-04 DIAGNOSIS — G5620 Lesion of ulnar nerve, unspecified upper limb: Secondary | ICD-10-CM | POA: Diagnosis not present

## 2011-07-04 DIAGNOSIS — M5412 Radiculopathy, cervical region: Secondary | ICD-10-CM | POA: Diagnosis not present

## 2011-07-13 ENCOUNTER — Emergency Department (HOSPITAL_COMMUNITY): Payer: Medicare Other

## 2011-07-13 ENCOUNTER — Encounter (HOSPITAL_COMMUNITY): Payer: Self-pay

## 2011-07-13 ENCOUNTER — Emergency Department (HOSPITAL_COMMUNITY)
Admission: EM | Admit: 2011-07-13 | Discharge: 2011-07-13 | Disposition: A | Discharge status: Home / Self Care | Payer: Medicare Other | Attending: Emergency Medicine | Admitting: Emergency Medicine

## 2011-07-13 DIAGNOSIS — R220 Localized swelling, mass and lump, head: Secondary | ICD-10-CM | POA: Diagnosis not present

## 2011-07-13 DIAGNOSIS — Y92410 Unspecified street and highway as the place of occurrence of the external cause: Secondary | ICD-10-CM | POA: Insufficient documentation

## 2011-07-13 DIAGNOSIS — R221 Localized swelling, mass and lump, neck: Secondary | ICD-10-CM | POA: Diagnosis not present

## 2011-07-13 DIAGNOSIS — S0003XA Contusion of scalp, initial encounter: Secondary | ICD-10-CM | POA: Diagnosis not present

## 2011-07-13 DIAGNOSIS — R51 Headache: Secondary | ICD-10-CM | POA: Diagnosis not present

## 2011-07-13 DIAGNOSIS — S0093XA Contusion of unspecified part of head, initial encounter: Secondary | ICD-10-CM

## 2011-07-13 DIAGNOSIS — S1093XA Contusion of unspecified part of neck, initial encounter: Secondary | ICD-10-CM | POA: Diagnosis not present

## 2011-07-13 DIAGNOSIS — T1490XA Injury, unspecified, initial encounter: Secondary | ICD-10-CM | POA: Diagnosis not present

## 2011-07-13 HISTORY — DX: Cerebral infarction, unspecified: I63.9

## 2011-07-13 MED ORDER — HYDROCODONE-ACETAMINOPHEN 5-325 MG PO TABS: 2.0000 | ORAL_TABLET | ORAL | Status: AC | PRN
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
## 2011-07-13 NOTE — Discharge Instructions (Signed)
Contusion  A contusion is a deep bruise. Contusions are the result of an injury that caused bleeding under the skin. The contusion may turn blue, purple, or yellow. Minor injuries will give you a painless contusion, but more severe contusions may stay painful and swollen for a few weeks.   CAUSES   A contusion is usually caused by a blow, trauma, or direct force to an area of the body.  SYMPTOMS    Swelling and redness of the injured area.   Bruising of the injured area.   Tenderness and soreness of the injured area.   Pain.  DIAGNOSIS   The diagnosis can be made by taking a history and physical exam. An X-ray, CT scan, or MRI may be needed to determine if there were any associated injuries, such as fractures.  TREATMENT   Specific treatment will depend on what area of the body was injured. In general, the best treatment for a contusion is resting, icing, elevating, and applying cold compresses to the injured area. Over-the-counter medicines may also be recommended for pain control. Ask your caregiver what the best treatment is for your contusion.  HOME CARE INSTRUCTIONS    Put ice on the injured area.   Put ice in a plastic bag.   Place a towel between your skin and the bag.   Leave the ice on for 15 to 20 minutes, 3 to 4 times a day.   Only take over-the-counter or prescription medicines for pain, discomfort, or fever as directed by your caregiver. Your caregiver may recommend avoiding anti-inflammatory medicines (aspirin, ibuprofen, and naproxen) for 48 hours because these medicines may increase bruising.   Rest the injured area.   If possible, elevate the injured area to reduce swelling.  SEEK IMMEDIATE MEDICAL CARE IF:    You have increased bruising or swelling.   You have pain that is getting worse.   Your swelling or pain is not relieved with medicines.  MAKE SURE YOU:    Understand these instructions.   Will watch your condition.   Will get help right away if you are not doing well or get  worse.  Document Released: 12/22/2004 Document Revised: 03/03/2011 Document Reviewed: 01/17/2011  [employer] Patient Information 2012 [employer]  Motor Vehicle Collision   It is common to have multiple bruises and sore muscles after a motor vehicle collision (MVC). These tend to feel worse for the first 24 hours. You may have the most stiffness and soreness over the first several hours. You may also feel worse when you wake up the first morning after your collision. After this point, you will usually begin to improve with each day. The speed of improvement often depends on the severity of the collision, the number of injuries, and the location and nature of these injuries.  HOME CARE INSTRUCTIONS    Put ice on the injured area.   Put ice in a plastic bag.   Place a towel between your skin and the bag.   Leave the ice on for 15 to 20 minutes, 3 to 4 times a day.   Drink enough fluids to keep your urine clear or pale yellow. Do not drink alcohol.   Take a warm shower or bath once or twice a day. This will increase blood flow to sore muscles.   You may return to activities as directed by your caregiver. Be careful when lifting, as this may aggravate neck or back pain.   Only take over-the-counter or prescription medicines   for pain, discomfort, or fever as directed by your caregiver. Do not use aspirin. This may increase bruising and bleeding.  SEEK IMMEDIATE MEDICAL CARE IF:   You have numbness, tingling, or weakness in the arms or legs.   You develop severe headaches not relieved with medicine.   You have severe neck pain, especially tenderness in the middle of the back of your neck.   You have changes in bowel or bladder control.   There is increasing pain in any area of the body.   You have shortness of breath, lightheadedness, dizziness, or fainting.   You have chest pain.   You feel sick to your stomach (nauseous), throw up (vomit), or sweat.   You have increasing abdominal  discomfort.   There is blood in your urine, stool, or vomit.   You have pain in your shoulder (shoulder strap areas).   You feel your symptoms are getting worse.  MAKE SURE YOU:    Understand these instructions.   Will watch your condition.   Will get help right away if you are not doing well or get worse.  Document Released: 03/14/2005 Document Revised: 03/03/2011 Document Reviewed: 08/11/2010  [employer] Patient Information 2012 [employer]

## 2011-07-13 NOTE — ED Notes (Signed)
Pt. D/C home. Pt. Is ambulatory. A.O. X 4 NAD. Prescription and discharge teaching in hand.

## 2011-07-13 NOTE — ED Notes (Signed)
LSB and C-Collar removed by EDP Radford Pax
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
## 2011-07-14 DIAGNOSIS — M4802 Spinal stenosis, cervical region: Secondary | ICD-10-CM | POA: Diagnosis not present

## 2011-09-01 DIAGNOSIS — M4802 Spinal stenosis, cervical region: Secondary | ICD-10-CM | POA: Diagnosis not present

## 2011-11-16 DIAGNOSIS — M4802 Spinal stenosis, cervical region: Secondary | ICD-10-CM | POA: Diagnosis not present

## 2011-11-16 DIAGNOSIS — M48061 Spinal stenosis, lumbar region without neurogenic claudication: Secondary | ICD-10-CM | POA: Diagnosis not present

## 2011-12-06 DIAGNOSIS — R209 Unspecified disturbances of skin sensation: Secondary | ICD-10-CM | POA: Diagnosis not present

## 2011-12-06 DIAGNOSIS — M79609 Pain in unspecified limb: Secondary | ICD-10-CM | POA: Diagnosis not present

## 2011-12-07 DIAGNOSIS — R5383 Other fatigue: Secondary | ICD-10-CM | POA: Diagnosis not present

## 2011-12-07 DIAGNOSIS — R5381 Other malaise: Secondary | ICD-10-CM | POA: Diagnosis not present

## 2011-12-07 DIAGNOSIS — Z1211 Encounter for screening for malignant neoplasm of colon: Secondary | ICD-10-CM | POA: Diagnosis not present

## 2011-12-07 DIAGNOSIS — Z136 Encounter for screening for cardiovascular disorders: Secondary | ICD-10-CM | POA: Diagnosis not present

## 2011-12-07 DIAGNOSIS — Z0000 Encounter for general adult medical examination without abnormal findings: Secondary | ICD-10-CM | POA: Diagnosis not present

## 2011-12-07 DIAGNOSIS — F329 Major depressive disorder, single episode, unspecified: Secondary | ICD-10-CM | POA: Diagnosis not present

## 2011-12-07 DIAGNOSIS — N529 Male erectile dysfunction, unspecified: Secondary | ICD-10-CM | POA: Diagnosis not present

## 2011-12-15 DIAGNOSIS — M79609 Pain in unspecified limb: Secondary | ICD-10-CM | POA: Diagnosis not present

## 2012-02-02 DIAGNOSIS — Z23 Encounter for immunization: Secondary | ICD-10-CM | POA: Diagnosis not present

## 2012-06-05 DIAGNOSIS — M5000 Cervical disc disorder with myelopathy, unspecified cervical region: Secondary | ICD-10-CM | POA: Diagnosis not present

## 2012-06-05 DIAGNOSIS — M4802 Spinal stenosis, cervical region: Secondary | ICD-10-CM | POA: Diagnosis not present

## 2012-10-03 DIAGNOSIS — M4712 Other spondylosis with myelopathy, cervical region: Secondary | ICD-10-CM | POA: Diagnosis not present

## 2012-10-12 ENCOUNTER — Ambulatory Visit (INDEPENDENT_AMBULATORY_CARE_PROVIDER_SITE_OTHER): Payer: Medicare Other | Admitting: Internal Medicine

## 2012-10-12 ENCOUNTER — Encounter: Payer: Self-pay | Admitting: Internal Medicine

## 2012-10-12 VITALS — BP 108/72 | HR 52 | Temp 98.0°F | Ht 67.0 in | Wt 143.0 lb

## 2012-10-12 DIAGNOSIS — G4700 Insomnia, unspecified: Secondary | ICD-10-CM | POA: Diagnosis not present

## 2012-10-12 DIAGNOSIS — F411 Generalized anxiety disorder: Secondary | ICD-10-CM | POA: Insufficient documentation

## 2012-10-12 DIAGNOSIS — L259 Unspecified contact dermatitis, unspecified cause: Secondary | ICD-10-CM | POA: Diagnosis not present

## 2012-10-12 DIAGNOSIS — Z8673 Personal history of transient ischemic attack (TIA), and cerebral infarction without residual deficits: Secondary | ICD-10-CM

## 2012-10-12 DIAGNOSIS — L309 Dermatitis, unspecified: Secondary | ICD-10-CM | POA: Insufficient documentation

## 2012-10-12 MED ORDER — CLOTRIMAZOLE-BETAMETHASONE 1-0.05 % EX CREA: TOPICAL_CREAM | Freq: Two times a day (BID) | CUTANEOUS | Status: DC

## 2012-10-12 MED ORDER — ALPRAZOLAM 1 MG PO TABS: 1.0000 mg | ORAL_TABLET | Freq: Three times a day (TID) | ORAL | Status: DC | PRN

## 2012-10-12 NOTE — Progress Notes (Signed)
HPI  Pt presents to the clinic today to establish care. He is transferring care from Dr. Ewell Poe today. He does need refills of his medications today. Other than that, he has no concerns.  Flu: yearly Tetanus: within last 10 years PSA: done yearly Colonoscopy: never Eye doctor: as needed Dentist: as needed  Past Medical History  Diagnosis Date  . Stroke   . Arthritis   . History of chicken pox     Current Outpatient Prescriptions  Medication Sig Dispense Refill  . ALPRAZolam (XANAX) 1 MG tablet Take 1 mg by mouth 3 (three) times daily as needed for sleep or anxiety.      . baclofen (LIORESAL) 10 MG tablet Take 10 mg by mouth 2 (two) times daily.       Marland Kitchen HYDROcodone-acetaminophen (NORCO) 10-325 MG per tablet Take 1 tablet by mouth every 8 (eight) hours as needed.       . clonazePAM (KLONOPIN) 0.5 MG tablet Take 0.5 mg by mouth 3 (three) times daily as needed.        No current facility-administered medications for this visit.    No Known Allergies  Family History  Problem Relation Age of Onset  . Family history unknown: Yes    History   Social History  . Marital Status: Single    Spouse Name: N/A    Number of Children: N/A  . Years of Education: 12   Occupational History  . DISABILITY    Social History Main Topics  . Smoking status: Never Smoker   . Smokeless tobacco: Not on file  . Alcohol Use: 1.2 oz/week    2 Cans of beer per week  . Drug Use: Yes    Special: Marijuana  . Sexually Active: Not Currently   Other Topics Concern  . Not on file   Social History Narrative   Regular exercise-no   Caffeine Use-no          ROS:  Constitutional: Denies fever, malaise, fatigue, headache or abrupt weight changes.  HEENT: Denies eye pain, eye redness, ear pain, ringing in the ears, wax buildup, runny nose, nasal congestion, bloody nose, or sore throat. Respiratory: Denies difficulty breathing, shortness of breath, cough or sputum production.    Cardiovascular: Denies chest pain, chest tightness, palpitations or swelling in the hands or feet.  Gastrointestinal: Denies abdominal pain, bloating, constipation, diarrhea or blood in the stool.  GU: Pt reports hesitancy. Denies frequency, urgency, pain with urination, blood in urine, odor or discharge. Musculoskeletal: Pt reports right hip pain and difficulty with gait secondary to stroke. Denies decrease in range of motion, muscle pain or joint pain and swelling.  Skin: Denies redness, rashes, lesions or ulcercations.  Neurological: Denies dizziness, difficulty with memory, difficulty with speech or problems with balance and coordination.   No other specific complaints in a complete review of systems (except as listed in HPI above).  PE:  BP 108/72  Pulse 52  Temp(Src) 98 F (36.7 C) (Oral)  Ht 5\' 7"  (1.702 m)  Wt 143 lb (64.864 kg)  BMI 22.39 kg/m2  SpO2 98% Wt Readings from Last 3 Encounters:  10/12/12 143 lb (64.864 kg)    General: Appears his stated age, well developed, well nourished in NAD. HEENT: Head: normal shape and size; Eyes: sclera white, no icterus, conjunctiva pink, PERRLA and EOMs intact; Ears: Tm's gray and intact, normal light reflex; Nose: mucosa pink and moist, septum midline; Throat/Mouth: Teeth present, mucosa pink and moist, no lesions or ulcerations noted.  Neck: Normal range of motion. Neck supple, trachea midline. No massses, lumps or thyromegaly present.  Cardiovascular: Normal rate and rhythm. S1,S2 noted.  No murmur, rubs or gallops noted. No JVD or BLE edema. No carotid bruits noted. Pulmonary/Chest: Normal effort and positive vesicular breath sounds. No respiratory distress. No wheezes, rales or ronchi noted.  Abdomen: Soft and nontender. Normal bowel sounds, no bruits noted. No distention or masses noted. Liver, spleen and kidneys non palpable. Strength L>R. Musculoskeletal: Normal range of motion. No signs of joint swelling. Mild difficulty with  gait, does not use assistive device.  Neurological: Alert and oriented. Cranial nerves II-XII intact. Coordination normal. +DTRs bilaterally. Psychiatric: Mood and affect normal. Behavior is normal. Judgment and thought content normal.     Assessment and Plan:  Health Maintenance:  Will schedule colonoscopy Encouraged pt to visit eye doctor and dentis on a more regular basis Will obtain records from previous provider to check immunizations status and review most recent labs

## 2012-10-12 NOTE — Assessment & Plan Note (Signed)
With right side hemiparesis BP well controlled

## 2012-10-12 NOTE — Patient Instructions (Signed)
Health Maintenance, Males A healthy lifestyle and preventative care can promote health and wellness.  Maintain regular health, dental, and eye exams.  Eat a healthy diet. Foods like vegetables, fruits, whole grains, low-fat dairy products, and lean protein foods contain the nutrients you need without too many calories. Decrease your intake of foods high in solid fats, added sugars, and salt. Get information about a proper diet from your caregiver, if necessary.  Regular physical exercise is one of the most important things you can do for your health. Most adults should get at least 150 minutes of moderate-intensity exercise (any activity that increases your heart rate and causes you to sweat) each week. In addition, most adults need muscle-strengthening exercises on 2 or more days a week.   Maintain a healthy weight. The body mass index (BMI) is a screening tool to identify possible weight problems. It provides an estimate of body fat based on height and weight. Your caregiver can help determine your BMI, and can help you achieve or maintain a healthy weight. For adults 20 years and older:  A BMI below 18.5 is considered underweight.  A BMI of 18.5 to 24.9 is normal.  A BMI of 25 to 29.9 is considered overweight.  A BMI of 30 and above is considered obese.  Maintain normal blood lipids and cholesterol by exercising and minimizing your intake of saturated fat. Eat a balanced diet with plenty of fruits and vegetables. Blood tests for lipids and cholesterol should begin at age 20 and be repeated every 5 years. If your lipid or cholesterol levels are high, you are over 50, or you are a high risk for heart disease, you may need your cholesterol levels checked more frequently.Ongoing high lipid and cholesterol levels should be treated with medicines, if diet and exercise are not effective.  If you smoke, find out from your caregiver how to quit. If you do not use tobacco, do not start.  If you  choose to drink alcohol, do not exceed 2 drinks per day. One drink is considered to be 12 ounces (355 mL) of beer, 5 ounces (148 mL) of wine, or 1.5 ounces (44 mL) of liquor.  Avoid use of street drugs. Do not share needles with anyone. Ask for help if you need support or instructions about stopping the use of drugs.  High blood pressure causes heart disease and increases the risk of stroke. Blood pressure should be checked at least every 1 to 2 years. Ongoing high blood pressure should be treated with medicines if weight loss and exercise are not effective.  If you are 45 to 50 years old, ask your caregiver if you should take aspirin to prevent heart disease.  Diabetes screening involves taking a blood sample to check your fasting blood sugar level. This should be done once every 3 years, after age 45, if you are within normal weight and without risk factors for diabetes. Testing should be considered at a younger age or be carried out more frequently if you are overweight and have at least 1 risk factor for diabetes.  Colorectal cancer can be detected and often prevented. Most routine colorectal cancer screening begins at the age of 50 and continues through age 75. However, your caregiver may recommend screening at an earlier age if you have risk factors for colon cancer. On a yearly basis, your caregiver may provide home test kits to check for hidden blood in the stool. Use of a small camera at the end of a tube,   to directly examine the colon (sigmoidoscopy or colonoscopy), can detect the earliest forms of colorectal cancer. Talk to your caregiver about this at age 50, when routine screening begins. Direct examination of the colon should be repeated every 5 to 10 years through age 75, unless early forms of pre-cancerous polyps or small growths are found.  Hepatitis C blood testing is recommended for all people born from 1945 through 1965 and any individual with known risks for hepatitis C.  Healthy  men should no longer receive prostate-specific antigen (PSA) blood tests as part of routine cancer screening. Consult with your caregiver about prostate cancer screening.  Testicular cancer screening is not recommended for adolescents or adult males who have no symptoms. Screening includes self-exam, caregiver exam, and other screening tests. Consult with your caregiver about any symptoms you have or any concerns you have about testicular cancer.  Practice safe sex. Use condoms and avoid high-risk sexual practices to reduce the spread of sexually transmitted infections (STIs).  Use sunscreen with a sun protection factor (SPF) of 30 or greater. Apply sunscreen liberally and repeatedly throughout the day. You should seek shade when your shadow is shorter than you. Protect yourself by wearing long sleeves, pants, a wide-brimmed hat, and sunglasses year round, whenever you are outdoors.  Notify your caregiver of new moles or changes in moles, especially if there is a change in shape or color. Also notify your caregiver if a mole is larger than the size of a pencil eraser.  A one-time screening for abdominal aortic aneurysm (AAA) and surgical repair of large AAAs by sound wave imaging (ultrasonography) is recommended for ages 65 to 75 years who are current or former smokers.  Stay current with your immunizations. Document Released: 09/10/2007 Document Revised: 06/06/2011 Document Reviewed: 08/09/2010 [employer] Patient Information 2014 [employer].  

## 2012-10-12 NOTE — Assessment & Plan Note (Signed)
Switched clonazepam to xanax, no early refills Consider CBT

## 2012-10-12 NOTE — Assessment & Plan Note (Signed)
Try to avoid hot showers Rub a moisturizing lotion on your skin daily Refilled cream

## 2012-10-12 NOTE — Assessment & Plan Note (Signed)
Xanax as needed at night

## 2012-10-15 ENCOUNTER — Telehealth: Payer: Self-pay | Admitting: *Deleted

## 2012-10-15 NOTE — Telephone Encounter (Signed)
Spoke with pt, advised him of regina's message.

## 2012-10-15 NOTE — Telephone Encounter (Signed)
Pt called states his Pain management is currently maintained by Dr Lelon Perla.  Pt is requesting his pain management to be transferred to [facility].  Please advise

## 2012-10-15 NOTE — Telephone Encounter (Signed)
I do not do chronic pain management

## 2012-10-16 ENCOUNTER — Telehealth: Payer: Self-pay | Admitting: *Deleted

## 2012-10-16 NOTE — Telephone Encounter (Signed)
noted 

## 2012-10-16 NOTE — Telephone Encounter (Signed)
[facility] called states Dr Elizabeth Palau is not at their office.  States pt has not been a pt at their practice.  Further states Dr Elizabeth Palau is at [facility] and was at [facility] previously.

## 2012-11-19 ENCOUNTER — Other Ambulatory Visit: Payer: Self-pay | Admitting: Internal Medicine

## 2012-12-19 ENCOUNTER — Other Ambulatory Visit: Payer: Self-pay

## 2012-12-19 ENCOUNTER — Other Ambulatory Visit: Payer: Self-pay | Admitting: Internal Medicine

## 2012-12-19 DIAGNOSIS — G4700 Insomnia, unspecified: Secondary | ICD-10-CM

## 2012-12-19 DIAGNOSIS — F411 Generalized anxiety disorder: Secondary | ICD-10-CM

## 2012-12-19 MED ORDER — ALPRAZOLAM 1 MG PO TABS: ORAL_TABLET | ORAL | Status: DC

## 2012-12-19 NOTE — Telephone Encounter (Signed)
Can you call this in for me since I am out of the office today? Thx!

## 2012-12-19 NOTE — Telephone Encounter (Signed)
Called in refill for xanax per [facility]...ds,cma

## 2013-01-14 ENCOUNTER — Other Ambulatory Visit: Payer: Self-pay | Admitting: Internal Medicine

## 2013-01-30 DIAGNOSIS — M4712 Other spondylosis with myelopathy, cervical region: Secondary | ICD-10-CM | POA: Diagnosis not present

## 2013-01-31 DIAGNOSIS — Z23 Encounter for immunization: Secondary | ICD-10-CM | POA: Diagnosis not present

## 2013-02-13 ENCOUNTER — Other Ambulatory Visit: Payer: Self-pay | Admitting: Internal Medicine

## 2013-02-13 NOTE — Telephone Encounter (Signed)
Ok to phone in.

## 2013-02-13 NOTE — Telephone Encounter (Signed)
Phoned in to pharmacy. 

## 2013-03-14 ENCOUNTER — Other Ambulatory Visit: Payer: Self-pay | Admitting: Internal Medicine

## 2013-03-14 NOTE — Telephone Encounter (Signed)
Rx called in to pharmacy. 

## 2013-03-14 NOTE — Telephone Encounter (Signed)
Ok to phone in xanax 

## 2013-04-15 ENCOUNTER — Telehealth: Payer: Self-pay | Admitting: *Deleted

## 2013-04-15 ENCOUNTER — Other Ambulatory Visit: Payer: Self-pay | Admitting: *Deleted

## 2013-04-15 MED ORDER — ALPRAZOLAM 1 MG PO TABS: ORAL_TABLET | ORAL | Status: DC

## 2013-04-15 NOTE — Telephone Encounter (Signed)
Give him a 30 day supply. If he wants to continue to get refills, he will need to come here to set up with [facility]

## 2013-04-15 NOTE — Telephone Encounter (Signed)
Pt left message requesting cb on status of xanax refill.

## 2013-04-15 NOTE — Telephone Encounter (Signed)
See telephone note.  Medication phoned to pharmacy.

## 2013-04-15 NOTE — Telephone Encounter (Signed)
Medication phoned to pharmacy. Patient advised. Patient states he will not be able to come to this office because he is disabled and [place] ride [place] [employer] [place].  He will phone the [facility] office to try to establish with someone there.

## 2013-04-15 NOTE — Telephone Encounter (Signed)
Patient phoned requesting refill for his xanax.  Last OV with PCP 10/12/2012.

## 2013-04-16 NOTE — Telephone Encounter (Signed)
[facility] stated that they did not receive Rx refill via msg. I called in Rx again today

## 2013-04-19 ENCOUNTER — Ambulatory Visit (INDEPENDENT_AMBULATORY_CARE_PROVIDER_SITE_OTHER): Payer: Medicare Other | Admitting: Physician Assistant

## 2013-04-19 ENCOUNTER — Encounter: Payer: Self-pay | Admitting: Physician Assistant

## 2013-04-19 ENCOUNTER — Other Ambulatory Visit (INDEPENDENT_AMBULATORY_CARE_PROVIDER_SITE_OTHER): Payer: Medicare Other

## 2013-04-19 VITALS — BP 118/60 | HR 97 | Temp 97.5°F | Resp 18 | Ht 67.0 in | Wt 150.8 lb

## 2013-04-19 DIAGNOSIS — N401 Enlarged prostate with lower urinary tract symptoms: Secondary | ICD-10-CM | POA: Diagnosis not present

## 2013-04-19 DIAGNOSIS — N139 Obstructive and reflux uropathy, unspecified: Secondary | ICD-10-CM

## 2013-04-19 DIAGNOSIS — R399 Unspecified symptoms and signs involving the genitourinary system: Secondary | ICD-10-CM

## 2013-04-19 DIAGNOSIS — N138 Other obstructive and reflux uropathy: Secondary | ICD-10-CM

## 2013-04-19 DIAGNOSIS — R3989 Other symptoms and signs involving the genitourinary system: Secondary | ICD-10-CM

## 2013-04-19 DIAGNOSIS — Z125 Encounter for screening for malignant neoplasm of prostate: Secondary | ICD-10-CM

## 2013-04-19 LAB — URINALYSIS, ROUTINE W REFLEX MICROSCOPIC
Bilirubin Urine: NEGATIVE
Hgb urine dipstick: NEGATIVE
Ketones, ur: NEGATIVE
LEUKOCYTES UA: NEGATIVE
Nitrite: NEGATIVE
TOTAL PROTEIN, URINE-UPE24: NEGATIVE
URINE GLUCOSE: NEGATIVE
Urobilinogen, UA: 0.2 (ref 0.0–1.0)
pH: 6 (ref 5.0–8.0)

## 2013-04-19 LAB — PSA: PSA: 0.47 ng/mL (ref 0.10–4.00)

## 2013-04-19 NOTE — Patient Instructions (Signed)
It was nice to meet you today!  Please take medication as instructed.  Return to office in 30 days for re-Prostate-Specific Antigen The prostate-specific antigen (PSA) is a blood test. It is used to help detect early forms of prostate cancer. The test is usually used along with other tests. The test is also used to follow the course of those who already have prostate cancer or who have been treated for prostate cancer. Some factors interfere with the results of the PSA. The factors listed below will either increase or decrease the PSA levels. They are:  Prescriptions used for male baldness.  Some herbs.  Active prostate infection.  Prior instrumentation or urinary catheterization.  Ejaculation up to 2 days prior to testing.  A noncancerous enlargement of the prostate.  Inflammation of the prostate.  Active urinary tract infection. If your test results are elevated, your caregiver will discuss the results with you. Your caregiver will also let you know if more evaluation is needed. PREPARATION FOR TEST No preparation or fasting is necessary. NORMAL FINDINGS Less than 4 ng/mL or Less than 63mcg/L (SI units) Ranges for normal findings may vary among different laboratories and hospitals. You should always check with your caregiver after having lab work or other tests done to discuss the meaning of your test results and whether your values are considered within normal limits. MEANING OF TEST  A normal value means prostate cancer is less likely. The chance of having prostate cancer increases if the value is between 4 ng/mL and 10 ng/mL. However, further testing will be needed. Values above 10 ng/mL indicate that there is a much higher chance of having prostate cancer (if the above situations that raise PSA are not present). Your caregiver will go over your test results with you and discuss the importance of this test. If this value is elevated, your caregiver may recommend further testing or  evaluation. OBTAINING THE TEST RESULTS It is your responsibility to obtain your test results. Ask the lab or department performing the test when and how you will get your results. Document Released: 04/16/2004 Document Revised: 06/06/2011 Document Reviewed: 10/20/2006 [facility] Patient Information 2014 [employer]. eavaluation

## 2013-04-19 NOTE — Progress Notes (Signed)
Pre-visit discussion using our clinic review tool. No additional management support is needed unless otherwise documented below in the visit note.  

## 2013-04-21 NOTE — Progress Notes (Signed)
   Subjective:    Patient ID: Danny Hopkins, male    DOB: [birth year], 53 y.o.   MRN: [id number]  HPI Comments: Patient is a 53 year old male who presents to the the clinic with complaint of difficulty urinating. Reports over the last week has had difficulty urinating stating stream is weak, closer to a dribble. Feels he is unable to empty his bladder. Denies N/V/F, denies blood in urine or stool, penile discharge or painful urination. Denies history of hemorrhoid, colon or prostate cancer.    Review of Systems  Constitutional: Negative for fever and chills.  Gastrointestinal: Positive for constipation (constipation secondary to narcotics for chronic pain). Negative for abdominal pain, diarrhea and rectal pain.  Genitourinary: Positive for frequency and difficulty urinating. Negative for urgency, hematuria, flank pain, discharge, penile swelling, scrotal swelling, penile pain and testicular pain.  Neurological: Negative for weakness and light-headedness.  All other systems reviewed and are negative.      Objective:   Physical Exam  Vitals reviewed. Constitutional: He is oriented to person, place, and time. He appears well-developed and well-nourished.  HENT:  Head: Normocephalic and atraumatic.  Eyes: Conjunctivae are normal.  Neck: Normal range of motion.  Cardiovascular: Normal rate, regular rhythm and intact distal pulses.  Exam reveals no gallop and no friction rub.   No murmur heard. Pulmonary/Chest: Effort normal and breath sounds normal.  Abdominal: Soft. He exhibits no distension. There is no tenderness.  Genitourinary: Rectum normal. Rectal exam shows no external hemorrhoid, no internal hemorrhoid, no fissure and anal tone normal. Prostate is enlarged (right aspect slightly enlarged, more linear than nodular.). Prostate is not tender.  Exam chaperoned by RMA, Wilfred Curtis  Neurological: He is alert and oriented to person, place, and time.  Skin: Skin is warm and dry.  Psychiatric:  He has a normal mood and affect.   Past Medical History  Diagnosis Date  . Stroke   . Arthritis   . History of chicken pox       Assessment & Plan:   Lower urinary tract symptoms Patient provided samples of Rapaflo, one tab daily with food. 48 doses.  Labs ordered, will review when resulted.  Patient instructed to RTO in 30 days for evaluation, sooner if needed.

## 2013-04-23 ENCOUNTER — Telehealth: Payer: Self-pay

## 2013-04-23 NOTE — Telephone Encounter (Signed)
Message copied by Colin Benton on Tue Apr 23, 2013 11:02 AM ------      Message from: Kyra Leyland      Created: Mon Apr 22, 2013  4:46 PM       Please phone patient and inform him all labs normal. Continue taking sample medications provided in office day of appointment. ------

## 2013-05-14 ENCOUNTER — Other Ambulatory Visit: Payer: Self-pay | Admitting: Internal Medicine

## 2013-05-15 NOTE — Telephone Encounter (Signed)
Last prescribed 04/15/13--please advise

## 2013-05-16 ENCOUNTER — Telehealth: Payer: Self-pay | Admitting: *Deleted

## 2013-05-16 MED ORDER — ALPRAZOLAM 1 MG PO TABS: ORAL_TABLET | ORAL | Status: DC

## 2013-05-16 NOTE — Telephone Encounter (Signed)
Patient notified to pick up Rx and sign pain contract// need screening as well.

## 2013-05-16 NOTE — Telephone Encounter (Signed)
Patient is phoning requesting refill on his xanax (last ordered 04/15/13) and last OV with PCP 04/19/13.  CB# [phone]

## 2013-05-20 ENCOUNTER — Telehealth: Payer: Self-pay | Admitting: *Deleted

## 2013-05-20 ENCOUNTER — Other Ambulatory Visit: Payer: Self-pay | Admitting: Physician Assistant

## 2013-05-20 DIAGNOSIS — R399 Unspecified symptoms and signs involving the genitourinary system: Secondary | ICD-10-CM

## 2013-05-20 MED ORDER — SILODOSIN 8 MG PO CAPS: 8.0000 mg | ORAL_CAPSULE | Freq: Every day | ORAL | Status: DC

## 2013-05-20 NOTE — Telephone Encounter (Signed)
Patient phoned and stated the rapaflo samples had run out.  Did PCP want to prescribe or provide more samples.  Further states he had told PCP in last OV with he has ED and is requesting samples for viagara or cialis.  Please advise.  CB# [phone]

## 2013-05-20 NOTE — Telephone Encounter (Signed)
Phoned and left voicemail message relaying PCP's response.

## 2013-05-22 DIAGNOSIS — Z79899 Other long term (current) drug therapy: Secondary | ICD-10-CM | POA: Diagnosis not present

## 2013-05-29 DIAGNOSIS — M5126 Other intervertebral disc displacement, lumbar region: Secondary | ICD-10-CM | POA: Insufficient documentation

## 2013-06-03 DIAGNOSIS — M5126 Other intervertebral disc displacement, lumbar region: Secondary | ICD-10-CM | POA: Diagnosis not present

## 2013-06-12 ENCOUNTER — Encounter: Payer: Self-pay | Admitting: Physician Assistant

## 2013-06-14 DIAGNOSIS — M5126 Other intervertebral disc displacement, lumbar region: Secondary | ICD-10-CM | POA: Diagnosis not present

## 2013-06-14 DIAGNOSIS — Z6825 Body mass index (BMI) 25.0-25.9, adult: Secondary | ICD-10-CM | POA: Diagnosis not present

## 2013-06-17 ENCOUNTER — Telehealth: Payer: Self-pay | Admitting: *Deleted

## 2013-06-17 ENCOUNTER — Telehealth: Payer: Self-pay

## 2013-06-17 MED ORDER — ALPRAZOLAM 1 MG PO TABS: ORAL_TABLET | ORAL | Status: DC

## 2013-06-17 NOTE — Telephone Encounter (Signed)
Pt called requesting Xanax refill.  Please advise

## 2013-06-17 NOTE — Telephone Encounter (Signed)
Ok to refill for one month.  Please phone patient and let him know he needs to schedule an appointment for evaluation to start him on a long acting medication for his anxiety. And we will then work on weaning him off the Xanax.

## 2013-06-17 NOTE — Telephone Encounter (Signed)
Med refilled and printed, and will be faxed to pharmacy

## 2013-06-24 ENCOUNTER — Ambulatory Visit: Payer: Medicare Other | Admitting: Physician Assistant

## 2013-06-26 ENCOUNTER — Emergency Department (HOSPITAL_BASED_OUTPATIENT_CLINIC_OR_DEPARTMENT_OTHER)
Admission: EM | Admit: 2013-06-26 | Discharge: 2013-06-26 | Disposition: A | Discharge status: Home / Self Care | Payer: Medicare Other | Attending: Emergency Medicine | Admitting: Emergency Medicine

## 2013-06-26 ENCOUNTER — Emergency Department (HOSPITAL_BASED_OUTPATIENT_CLINIC_OR_DEPARTMENT_OTHER): Payer: Medicare Other

## 2013-06-26 DIAGNOSIS — Z8673 Personal history of transient ischemic attack (TIA), and cerebral infarction without residual deficits: Secondary | ICD-10-CM | POA: Insufficient documentation

## 2013-06-26 DIAGNOSIS — Y9389 Activity, other specified: Secondary | ICD-10-CM | POA: Insufficient documentation

## 2013-06-26 DIAGNOSIS — Z8619 Personal history of other infectious and parasitic diseases: Secondary | ICD-10-CM | POA: Insufficient documentation

## 2013-06-26 DIAGNOSIS — S79929A Unspecified injury of unspecified thigh, initial encounter: Principal | ICD-10-CM

## 2013-06-26 DIAGNOSIS — G8929 Other chronic pain: Secondary | ICD-10-CM | POA: Insufficient documentation

## 2013-06-26 DIAGNOSIS — M25552 Pain in left hip: Secondary | ICD-10-CM

## 2013-06-26 DIAGNOSIS — S79919A Unspecified injury of unspecified hip, initial encounter: Secondary | ICD-10-CM | POA: Diagnosis not present

## 2013-06-26 DIAGNOSIS — Y92410 Unspecified street and highway as the place of occurrence of the external cause: Secondary | ICD-10-CM | POA: Insufficient documentation

## 2013-06-26 DIAGNOSIS — IMO0002 Reserved for concepts with insufficient information to code with codable children: Secondary | ICD-10-CM | POA: Diagnosis not present

## 2013-06-26 DIAGNOSIS — Z79899 Other long term (current) drug therapy: Secondary | ICD-10-CM | POA: Insufficient documentation

## 2013-06-26 DIAGNOSIS — M129 Arthropathy, unspecified: Secondary | ICD-10-CM | POA: Insufficient documentation

## 2013-06-26 DIAGNOSIS — M25559 Pain in unspecified hip: Secondary | ICD-10-CM | POA: Diagnosis not present

## 2013-06-26 DIAGNOSIS — M25551 Pain in right hip: Secondary | ICD-10-CM

## 2013-06-26 NOTE — ED Notes (Signed)
Pt returned from radiology- NAD
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
## 2013-06-26 NOTE — ED Notes (Signed)
Pt reports left leg "just feels funny".  Sensation intact.
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
## 2013-06-26 NOTE — ED Provider Notes (Signed)
Medical screening examination/treatment/procedure(s) were performed by non-physician practitioner and as supervising physician I was immediately available for consultation/collaboration.   EKG Interpretation None        Blanchie Dessert, MD 06/26/13 2351

## 2013-06-27 ENCOUNTER — Ambulatory Visit: Payer: Medicare Other | Admitting: Physician Assistant

## 2013-07-17 ENCOUNTER — Telehealth: Payer: Self-pay | Admitting: Internal Medicine

## 2013-07-17 MED ORDER — ALPRAZOLAM 1 MG PO TABS: 1.0000 mg | ORAL_TABLET | Freq: Two times a day (BID) | ORAL | Status: DC | PRN

## 2013-07-17 NOTE — Telephone Encounter (Signed)
[facility] for bid prn dosing only please  [facility] for #[phone]  Needs OV for further refills with her new PCP

## 2013-07-17 NOTE — Telephone Encounter (Signed)
Requesting a refill on Xanex due on the 23rd.  Pt has an appt with Dr. Jenny Reichmann to transfer from [facility] on April 29. [facility] [address]

## 2013-07-18 MED ORDER — ALPRAZOLAM 1 MG PO TABS: 1.0000 mg | ORAL_TABLET | Freq: Two times a day (BID) | ORAL | Status: DC | PRN

## 2013-07-18 NOTE — Telephone Encounter (Signed)
[facility].../lmb

## 2013-07-24 ENCOUNTER — Encounter: Payer: Self-pay | Admitting: Internal Medicine

## 2013-07-24 ENCOUNTER — Other Ambulatory Visit: Payer: Self-pay | Admitting: Internal Medicine

## 2013-07-24 ENCOUNTER — Ambulatory Visit (INDEPENDENT_AMBULATORY_CARE_PROVIDER_SITE_OTHER): Payer: Medicare Other | Admitting: Internal Medicine

## 2013-07-24 ENCOUNTER — Other Ambulatory Visit (INDEPENDENT_AMBULATORY_CARE_PROVIDER_SITE_OTHER): Payer: Medicare Other

## 2013-07-24 VITALS — BP 112/78 | HR 56 | Temp 97.4°F | Wt 167.8 lb

## 2013-07-24 DIAGNOSIS — F411 Generalized anxiety disorder: Secondary | ICD-10-CM

## 2013-07-24 DIAGNOSIS — L309 Dermatitis, unspecified: Secondary | ICD-10-CM

## 2013-07-24 DIAGNOSIS — I6350 Cerebral infarction due to unspecified occlusion or stenosis of unspecified cerebral artery: Secondary | ICD-10-CM

## 2013-07-24 DIAGNOSIS — Z8673 Personal history of transient ischemic attack (TIA), and cerebral infarction without residual deficits: Secondary | ICD-10-CM

## 2013-07-24 DIAGNOSIS — R5383 Other fatigue: Secondary | ICD-10-CM

## 2013-07-24 DIAGNOSIS — L259 Unspecified contact dermatitis, unspecified cause: Secondary | ICD-10-CM | POA: Diagnosis not present

## 2013-07-24 DIAGNOSIS — Z0001 Encounter for general adult medical examination with abnormal findings: Secondary | ICD-10-CM | POA: Insufficient documentation

## 2013-07-24 DIAGNOSIS — R5381 Other malaise: Secondary | ICD-10-CM | POA: Diagnosis not present

## 2013-07-24 DIAGNOSIS — I639 Cerebral infarction, unspecified: Secondary | ICD-10-CM

## 2013-07-24 DIAGNOSIS — R269 Unspecified abnormalities of gait and mobility: Secondary | ICD-10-CM | POA: Insufficient documentation

## 2013-07-24 DIAGNOSIS — Z0000 Encounter for general adult medical examination without abnormal findings: Secondary | ICD-10-CM

## 2013-07-24 HISTORY — DX: Unspecified abnormalities of gait and mobility: R26.9

## 2013-07-24 LAB — URINALYSIS, ROUTINE W REFLEX MICROSCOPIC
BILIRUBIN URINE: NEGATIVE
Hgb urine dipstick: NEGATIVE
KETONES UR: NEGATIVE
Leukocytes, UA: NEGATIVE
NITRITE: NEGATIVE
PH: 6.5 (ref 5.0–8.0)
RBC / HPF: NONE SEEN (ref 0–?)
Specific Gravity, Urine: 1.01 (ref 1.000–1.030)
TOTAL PROTEIN, URINE-UPE24: NEGATIVE
Urine Glucose: NEGATIVE
Urobilinogen, UA: 0.2 (ref 0.0–1.0)

## 2013-07-24 LAB — CBC WITH DIFFERENTIAL/PLATELET
Basophils Absolute: 0.1 10*3/uL (ref 0.0–0.1)
Basophils Relative: 0.7 % (ref 0.0–3.0)
Eosinophils Absolute: 0.2 10*3/uL (ref 0.0–0.7)
Eosinophils Relative: 2.7 % (ref 0.0–5.0)
HCT: 51.3 % (ref 39.0–52.0)
Hemoglobin: 17 g/dL (ref 13.0–17.0)
Lymphocytes Relative: 36.3 % (ref 12.0–46.0)
Lymphs Abs: 2.6 10*3/uL (ref 0.7–4.0)
MCHC: 33.2 g/dL (ref 30.0–36.0)
MCV: 91.2 fl (ref 78.0–100.0)
Monocytes Absolute: 0.8 10*3/uL (ref 0.1–1.0)
Monocytes Relative: 11.4 % (ref 3.0–12.0)
Neutro Abs: 3.5 10*3/uL (ref 1.4–7.7)
Neutrophils Relative %: 48.9 % (ref 43.0–77.0)
Platelets: 208 10*3/uL (ref 150.0–400.0)
RBC: 5.63 Mil/uL (ref 4.22–5.81)
RDW: 14.9 % — ABNORMAL HIGH (ref 11.5–14.6)
WBC: 7.2 10*3/uL (ref 4.5–10.5)

## 2013-07-24 LAB — LIPID PANEL
Cholesterol: 244 mg/dL — ABNORMAL HIGH (ref 0–200)
HDL: 60.1 mg/dL (ref >39.00)
LDL Cholesterol: 163 mg/dL — ABNORMAL HIGH (ref 0–99)
Total CHOL/HDL Ratio: 4
Triglycerides: 106 mg/dL (ref 0.0–149.0)
VLDL: 21.2 mg/dL (ref 0.0–40.0)

## 2013-07-24 LAB — BASIC METABOLIC PANEL
BUN: 14 mg/dL (ref 6–23)
CO2: 32 mEq/L (ref 19–32)
CREATININE: 1.1 mg/dL (ref 0.4–1.5)
Calcium: 9.6 mg/dL (ref 8.4–10.5)
Chloride: 100 mEq/L (ref 96–112)
GFR: 92.64 mL/min (ref >60.00)
Glucose, Bld: 82 mg/dL (ref 70–99)
POTASSIUM: 4.2 meq/L (ref 3.5–5.1)
Sodium: 139 mEq/L (ref 135–145)

## 2013-07-24 LAB — HEPATIC FUNCTION PANEL
ALT: 15 U/L (ref 0–53)
AST: 16 U/L (ref 0–37)
Albumin: 4.2 g/dL (ref 3.5–5.2)
Alkaline Phosphatase: 58 U/L (ref 39–117)
BILIRUBIN TOTAL: 1.1 mg/dL (ref 0.3–1.2)
Bilirubin, Direct: 0.1 mg/dL (ref 0.0–0.3)
Total Protein: 7.3 g/dL (ref 6.0–8.3)

## 2013-07-24 LAB — TSH: TSH: 1.21 u[IU]/mL (ref 0.35–5.50)

## 2013-07-24 MED ORDER — SILDENAFIL CITRATE 100 MG PO TABS: 50.0000 mg | ORAL_TABLET | Freq: Every day | ORAL | Status: DC | PRN

## 2013-07-24 MED ORDER — TAMSULOSIN HCL 0.4 MG PO CAPS: 0.4000 mg | ORAL_CAPSULE | Freq: Every day | ORAL | Status: DC

## 2013-07-24 MED ORDER — CLOTRIMAZOLE-BETAMETHASONE 1-0.05 % EX CREA: TOPICAL_CREAM | Freq: Two times a day (BID) | CUTANEOUS | Status: DC

## 2013-07-24 MED ORDER — ATORVASTATIN CALCIUM 20 MG PO TABS: 20.0000 mg | ORAL_TABLET | Freq: Every day | ORAL | Status: DC

## 2013-07-24 NOTE — Patient Instructions (Signed)
OK to stop the rapaflo when you run out  Please take all new medication as prescribed  - the flomax (generic) at 1 per day  You are given the prescription for the 3 free viagra with the coupon  Please continue all other medications as before, and refills have been done if requested -  Please have the pharmacy call with any other refills you may need.  Please continue your efforts at being more active, low cholesterol diet, and weight control.  You are otherwise up to date with prevention measures today.  Please go to the LAB in the Basement (turn left off the elevator) for the tests to be done today  You will be contacted by phone if any changes need to be made immediately.  Otherwise, you will receive a letter about your results with an explanation, but please check with MyChart first.  Please remember to sign up for MyChart if you have not done so, as this will be important to you in the future with finding out test results, communicating by private email, and scheduling acute appointments online when needed.  Please return in 6 months, or sooner if needed

## 2013-07-24 NOTE — Assessment & Plan Note (Signed)
stable overall by history and exam, recent data reviewed with pt, and pt to continue medical treatment as before,  to f/u any worsening symptoms or concerns le For f/u labs today, check lipids, glc

## 2013-07-24 NOTE — Assessment & Plan Note (Signed)
Also for TSH, to f/u any worsening symptoms or concerns

## 2013-07-24 NOTE — Progress Notes (Signed)
Subjective:    Patient ID: Danny Hopkins, male    DOB: Feb 04, 1961, 53 y.o.   MRN: [id number]  HPI  Here to f/u; overall doing ok,  Pt denies chest pain, increased sob or doe, wheezing, orthopnea, PND, increased LE swelling, palpitations, dizziness or syncope.  Pt denies polydipsia, polyuria, or low sugar symptoms such as weakness or confusion improved with po intake.  Pt denies new neurological symptoms such as new headache, or facial or extremity weakness or numbness.   Pt states overall good compliance with meds, has been trying to follow lower cholesterol diet, with wt overall stable,  but little exercise however. No current complaints. Has chronic left sided weakness no change. Had recent psa normal. No lipids or other labs recent.  Has what sounds like retrograde ejaculation with the rapaflo, asks for change.  Asks for viagra sample as well. Does c/o ongoing fatigue, but denies signficant daytime hypersomnolence.  Past Medical History  Diagnosis Date  . Stroke   . Arthritis   . History of chicken pox    No past surgical history on file.  reports that he has never smoked. He does not have any smokeless tobacco history on file. He reports that he drinks about 1.2 ounces of alcohol per week. He reports that he uses illicit drugs (Marijuana). family history is not on file. No Known Allergies Current Outpatient Prescriptions on File Prior to Visit  Medication Sig Dispense Refill  . ALPRAZolam (XANAX) 1 MG tablet Take 1 tablet (1 mg total) by mouth 2 (two) times daily as needed for anxiety.  60 tablet  0  . baclofen (LIORESAL) 10 MG tablet Take 10 mg by mouth 2 (two) times daily.       Marland Kitchen HYDROcodone-acetaminophen (NORCO) 10-325 MG per tablet Take 1 tablet by mouth every 8 (eight) hours as needed.       . silodosin (RAPAFLO) 8 MG CAPS capsule Take 1 capsule (8 mg total) by mouth daily with breakfast.  30 capsule  3   No current facility-administered medications on file prior to visit.    Review of Systems  Constitutional: Negative for unusual diaphoresis or other sweats  HENT: Negative for ringing in ear Eyes: Negative for double vision or worsening visual disturbance.  Respiratory: Negative for choking and stridor.   Gastrointestinal: Negative for vomiting or other signifcant bowel change Genitourinary: Negative for hematuria or decreased urine volume.  Musculoskeletal: Negative for other MSK pain or swelling Skin: Negative for color change and worsening wound.  Neurological: Negative for tremors and numbness other than noted  Psychiatric/Behavioral: Negative for decreased concentration or agitation other than above       Objective:   Physical Exam BP 112/78  Pulse 56  Temp(Src) 97.4 F (36.3 C) (Oral)  Wt 167 lb 12 oz (76.091 kg)  SpO2 97% VS noted,  Constitutional: Pt appears well-developed, well-nourished.  HENT: Head: NCAT.  Right Ear: External ear normal.  Left Ear: External ear normal.  Eyes: . Pupils are equal, round, and reactive to light. Conjunctivae and EOM are normal Neck: Normal range of motion. Neck supple.  Cardiovascular: Normal rate and regular rhythm.   Pulmonary/Chest: Effort normal and breath sounds normal.  Abd:  Soft, NT, ND, + BS Neurological: Pt is alert. Not confused , motor grossly intact except for LUE/LLE 4-4+/5 weakness, has marked gait abormal due to left weakness, stiff back, but does not seem of balance or at risk of fall Skin: Skin is warm. No rash except for  facies right check - lotrisone helped in the past Psychiatric: Pt behavior is normal. No agitation.     Assessment & Plan:

## 2013-07-24 NOTE — Progress Notes (Signed)
Pre visit review using our clinic review tool, if applicable. No additional management support is needed unless otherwise documented below in the visit note. 

## 2013-07-24 NOTE — Assessment & Plan Note (Signed)
stable overall by history and exam, and pt to continue medical treatment as before,  to f/u any worsening symptoms or concerns 

## 2013-07-24 NOTE — Assessment & Plan Note (Signed)
For refill cream prn

## 2013-07-24 NOTE — Assessment & Plan Note (Signed)
stable overall by history and exam, recent data reviewed with pt, and pt to continue medical treatment as before,  to f/u any worsening symptoms or concerns Lab Results  Component Value Date   PSA 0.47 04/19/2013

## 2013-08-21 ENCOUNTER — Encounter: Payer: Self-pay | Admitting: Internal Medicine

## 2013-09-10 ENCOUNTER — Ambulatory Visit (AMBULATORY_SURGERY_CENTER): Payer: Self-pay

## 2013-09-10 VITALS — Ht 67.0 in | Wt 164.2 lb

## 2013-09-10 DIAGNOSIS — Z1211 Encounter for screening for malignant neoplasm of colon: Secondary | ICD-10-CM

## 2013-09-10 MED ORDER — MOVIPREP 100 G PO SOLR: 1.0000 | Freq: Once | ORAL | Status: DC

## 2013-09-10 NOTE — Progress Notes (Signed)
No allergies to eggs or soy No home oxygen No diet/weight loss meds No past problems with anesthesia  Has email  Emmi instructions given for colonoscopy 

## 2013-09-11 ENCOUNTER — Other Ambulatory Visit: Payer: Self-pay | Admitting: Internal Medicine

## 2013-09-11 NOTE — Telephone Encounter (Signed)
Done hardcopy to robin  

## 2013-09-11 NOTE — Telephone Encounter (Signed)
[facility] to [facility] [address] [place] 

## 2013-09-20 ENCOUNTER — Telehealth: Payer: Self-pay | Admitting: Internal Medicine

## 2013-09-20 NOTE — Telephone Encounter (Signed)
Patient's questions about returning to normal activity were answered

## 2013-09-23 ENCOUNTER — Telehealth: Payer: Self-pay | Admitting: Internal Medicine

## 2013-09-23 NOTE — Telephone Encounter (Signed)
Patient really can't afford the prep.   A free coupon from [employer] is at the front desk for him per Thurston Pounds, RN.  Patient is aware to come and get it before 5pm today.

## 2013-09-24 ENCOUNTER — Encounter: Payer: Self-pay | Admitting: Internal Medicine

## 2013-09-24 ENCOUNTER — Ambulatory Visit (AMBULATORY_SURGERY_CENTER): Payer: Medicare Other | Admitting: Internal Medicine

## 2013-09-24 VITALS — BP 110/75 | HR 62 | Temp 96.9°F | Resp 32 | Ht 67.0 in | Wt 164.0 lb

## 2013-09-24 DIAGNOSIS — D126 Benign neoplasm of colon, unspecified: Secondary | ICD-10-CM

## 2013-09-24 DIAGNOSIS — Z1211 Encounter for screening for malignant neoplasm of colon: Secondary | ICD-10-CM | POA: Diagnosis not present

## 2013-09-24 DIAGNOSIS — D123 Benign neoplasm of transverse colon: Secondary | ICD-10-CM

## 2013-09-24 DIAGNOSIS — Z8673 Personal history of transient ischemic attack (TIA), and cerebral infarction without residual deficits: Secondary | ICD-10-CM | POA: Diagnosis not present

## 2013-09-24 MED ORDER — SODIUM CHLORIDE 0.9 % IV SOLN: 500.0000 mL | INTRAVENOUS | Status: DC

## 2013-09-24 NOTE — Progress Notes (Signed)
A/ox3, pleased with MAC, report to RN 

## 2013-09-24 NOTE — Patient Instructions (Signed)
YOU HAD AN ENDOSCOPIC PROCEDURE TODAY AT THE [facility]: Refer to the procedure report that was given to you for any specific questions about what was found during the examination.  If the procedure report does not answer your questions, please call your gastroenterologist to clarify.  If you requested that your care partner not be given the details of your procedure findings, then the procedure report has been included in a sealed envelope for you to review at your convenience later.  YOU SHOULD EXPECT: Some feelings of bloating in the abdomen. Passage of more gas than usual.  Walking can help get rid of the air that was put into your GI tract during the procedure and reduce the bloating. If you had a lower endoscopy (such as a colonoscopy or flexible sigmoidoscopy) you may notice spotting of blood in your stool or on the toilet paper. If you underwent a bowel prep for your procedure, then you may not have a normal bowel movement for a few days.  DIET: Your first meal following the procedure should be a light meal and then it is ok to progress to your normal diet.  A half-sandwich or bowl of soup is an example of a good first meal.  Heavy or fried foods are harder to digest and may make you feel nauseous or bloated.  Likewise meals heavy in dairy and vegetables can cause extra gas to form and this can also increase the bloating.  Drink plenty of fluids but you should avoid alcoholic beverages for 24 hours.  ACTIVITY: Your care partner should take you home directly after the procedure.  You should plan to take it easy, moving slowly for the rest of the day.  You can resume normal activity the day after the procedure however you should NOT DRIVE or use heavy machinery for 24 hours (because of the sedation medicines used during the test).    SYMPTOMS TO REPORT IMMEDIATELY: A gastroenterologist can be reached at any hour.  During normal business hours, 8:30 AM to 5:00 PM Monday through Friday,  call [phone].  After hours and on weekends, please call the GI answering service at [phone] who will take a message and have the physician on call contact you.   Following lower endoscopy (colonoscopy or flexible sigmoidoscopy):  Excessive amounts of blood in the stool  Significant tenderness or worsening of abdominal pains  Swelling of the abdomen that is new, acute  Fever of 100F or higher  FOLLOW UP: If any biopsies were taken you will be contacted by phone or by letter within the next 1-3 weeks.  Call your gastroenterologist if you have not heard about the biopsies in 3 weeks.  Our staff will call the home number listed on your records the next business day following your procedure to check on you and address any questions or concerns that you may have at that time regarding the information given to you following your procedure. This is a courtesy call and so if there is no answer at the home number and we have not heard from you through the emergency physician on call, we will assume that you have returned to your regular daily activities without incident.  SIGNATURES/CONFIDENTIALITY: You and/or your care partner have signed paperwork which will be entered into your electronic medical record.  These signatures attest to the fact that that the information above on your After Visit Summary has been reviewed and is understood.  Full responsibility of the confidentiality of this   discharge information lies with you and/or your care-partner.  Await pathology  Please read handout about polyps  Continue your normal medications

## 2013-09-24 NOTE — Op Note (Signed)
[facility]  [address] [place], [id number]   COLONOSCOPY PROCEDURE REPORT  PATIENT: Danny Hopkins, Danny Hopkins  MR#: [id number] BIRTHDATE: 1960-11-27 , 53  yrs. old GENDER: Male ENDOSCOPIST: Jerene Bears, MD REFERRED FW:YOVZC John, M.D. PROCEDURE DATE:  09/24/2013 PROCEDURE:   Colonoscopy with snare polypectomy First Screening Colonoscopy - Avg.  risk and is 50 yrs.  old or older Yes.  Prior Negative Screening - Now for repeat screening. N/A  History of Adenoma - Now for follow-up colonoscopy & has been > or = to 3 yrs.  N/A  Polyps Removed Today? Yes. ASA CLASS:   Class II INDICATIONS:average risk screening and first colonoscopy. MEDICATIONS: MAC sedation, administered by CRNA and propofol (Diprivan) 280mg  IV  DESCRIPTION OF PROCEDURE:   After the risks benefits and alternatives of the procedure were thoroughly explained, informed consent was obtained.  A digital rectal exam revealed no rectal mass.   The LB [id number]  endoscope was introduced through the anus and advanced to the cecum, which was identified by both the appendix and ileocecal valve. No adverse events experienced. The quality of the prep was good, using MoviPrep  The instrument was then slowly withdrawn as the colon was fully examined.  COLON FINDINGS: Two sessile polyps measuring 4-5 mm in size were found in the transverse colon.  Polypectomy was performed using cold snare.  All resections were complete and all polyp tissue was completely retrieved.   The colon mucosa was otherwise normal. Retroflexed views revealed internal hemorrhoids. The time to cecum=6 minutes 32 seconds.  Withdrawal time=10 minutes 16 seconds. The scope was withdrawn and the procedure completed. COMPLICATIONS: There were no complications.  ENDOSCOPIC IMPRESSION: 1.   Two sessile polyps measuring 4-5 mm in size were found in the transverse colon; Polypectomy was performed using cold snare 2.   The colon mucosa was  otherwise normal  RECOMMENDATIONS: 1.  Await pathology results 2.  If the polyps removed today are proven to be adenomatous (pre-cancerous) polyps, you will need a repeat colonoscopy in 5 years.  Otherwise you should continue to follow colorectal cancer screening guidelines for "routine risk" patients with colonoscopy in 10 years.  You will receive a letter within 1-2 weeks with the results of your biopsy as well as final recommendations.  Please call my office if you have not received a letter after 3 weeks.   eSigned:  Jerene Bears, MD 09/24/2013 2:08 PM  cc: The Patient and Biagio Borg, MD

## 2013-09-24 NOTE — Progress Notes (Signed)
Called to room to assist during endoscopic procedure.  Patient ID and intended procedure confirmed with present staff. Received instructions for my participation in the procedure from the performing physician.  

## 2013-09-25 ENCOUNTER — Telehealth: Payer: Self-pay | Admitting: *Deleted

## 2013-09-25 NOTE — Telephone Encounter (Signed)
  Follow up Call-  Call back number 09/24/2013  Post procedure Call Back phone  # [phone]  Permission to leave phone message Yes     Patient questions:  Do you have a fever, pain , or abdominal swelling? No. Pain Score  0 *  Have you tolerated food without any problems? Yes.    Have you been able to return to your normal activities? Yes.    Do you have any questions about your discharge instructions: Diet   No. Medications  No. Follow up visit  No.  Do you have questions or concerns about your Care? No.  Actions: * If pain score is 4 or above: No action needed, pain <4.

## 2013-10-02 ENCOUNTER — Encounter: Payer: Self-pay | Admitting: Internal Medicine

## 2013-10-07 ENCOUNTER — Other Ambulatory Visit: Payer: Self-pay | Admitting: Internal Medicine

## 2013-10-09 ENCOUNTER — Other Ambulatory Visit: Payer: Self-pay | Admitting: Internal Medicine

## 2013-10-10 DIAGNOSIS — Z6832 Body mass index (BMI) 32.0-32.9, adult: Secondary | ICD-10-CM | POA: Diagnosis not present

## 2013-10-10 DIAGNOSIS — M4712 Other spondylosis with myelopathy, cervical region: Secondary | ICD-10-CM | POA: Diagnosis not present

## 2013-10-10 DIAGNOSIS — M47019 Anterior spinal artery compression syndromes, site unspecified: Secondary | ICD-10-CM | POA: Insufficient documentation

## 2013-10-10 DIAGNOSIS — M5126 Other intervertebral disc displacement, lumbar region: Secondary | ICD-10-CM | POA: Diagnosis not present

## 2013-10-22 ENCOUNTER — Telehealth: Payer: Self-pay | Admitting: Internal Medicine

## 2013-10-22 NOTE — Telephone Encounter (Signed)
Patient had a screening colonoscopy on 09/24/13.  He reports that since Saturday he has had some vague nausea and abdominal discomfort. The pain is upper abdominal and started after a BM and some laxatives.    He reports he has not had any pain today, but he was reading his procedure report from 6/30 and to call for any abdominal pain. He declines an appt and says he is feeling much better and just wanted [place] to be aware.    He is advised that pain and nausea are very unlikely to be related to the procedure from almost a month ago.  He denies rectal bleeding, constipation, diarrhea.  He will call back if he has persistent pain for other GI concerns.

## 2013-11-10 ENCOUNTER — Other Ambulatory Visit: Payer: Self-pay | Admitting: Internal Medicine

## 2013-11-12 NOTE — Telephone Encounter (Signed)
Done hardcopy to robin  

## 2013-11-12 NOTE — Telephone Encounter (Signed)
Patient states that he is completely out of his Xanax rx. He wants to know if it can be filled today.

## 2013-11-13 NOTE — Telephone Encounter (Signed)
Faxed hardcopy for Alprazolam to [facility]

## 2014-01-08 ENCOUNTER — Other Ambulatory Visit: Payer: Self-pay

## 2014-01-08 MED ORDER — ALPRAZOLAM 1 MG PO TABS
ORAL_TABLET | ORAL | Status: DC
Start: 2014-01-08 — End: 2014-02-04

## 2014-01-08 NOTE — Telephone Encounter (Signed)
Done hardcopy to robin  

## 2014-01-08 NOTE — Telephone Encounter (Signed)
Faxed hardcopy for Alprazolam to [place]

## 2014-01-21 ENCOUNTER — Ambulatory Visit: Payer: Medicare Other | Admitting: Internal Medicine

## 2014-01-23 ENCOUNTER — Ambulatory Visit: Payer: Medicare Other | Admitting: Internal Medicine

## 2014-02-04 ENCOUNTER — Ambulatory Visit (INDEPENDENT_AMBULATORY_CARE_PROVIDER_SITE_OTHER): Payer: Medicare Other | Admitting: Internal Medicine

## 2014-02-04 ENCOUNTER — Encounter: Payer: Self-pay | Admitting: Internal Medicine

## 2014-02-04 VITALS — BP 106/72 | HR 70 | Temp 98.6°F | Ht 67.0 in | Wt 153.5 lb

## 2014-02-04 DIAGNOSIS — Z23 Encounter for immunization: Secondary | ICD-10-CM

## 2014-02-04 DIAGNOSIS — N320 Bladder-neck obstruction: Secondary | ICD-10-CM | POA: Diagnosis not present

## 2014-02-04 DIAGNOSIS — I639 Cerebral infarction, unspecified: Secondary | ICD-10-CM

## 2014-02-04 DIAGNOSIS — E785 Hyperlipidemia, unspecified: Secondary | ICD-10-CM | POA: Diagnosis not present

## 2014-02-04 DIAGNOSIS — F411 Generalized anxiety disorder: Secondary | ICD-10-CM | POA: Diagnosis not present

## 2014-02-04 DIAGNOSIS — L309 Dermatitis, unspecified: Secondary | ICD-10-CM | POA: Diagnosis not present

## 2014-02-04 DIAGNOSIS — G479 Sleep disorder, unspecified: Secondary | ICD-10-CM | POA: Insufficient documentation

## 2014-02-04 MED ORDER — ESCITALOPRAM OXALATE 10 MG PO TABS: 10.0000 mg | ORAL_TABLET | Freq: Every day | ORAL | Status: DC

## 2014-02-04 MED ORDER — ALPRAZOLAM 1 MG PO TABS: ORAL_TABLET | ORAL | Status: DC

## 2014-02-04 MED ORDER — CLOTRIMAZOLE-BETAMETHASONE 1-0.05 % EX CREA: TOPICAL_CREAM | Freq: Two times a day (BID) | CUTANEOUS | Status: DC

## 2014-02-04 NOTE — Patient Instructions (Addendum)
You had the flu shot today  Please take all new medication as prescribed - the lexapro 10 mg (generic) which helps with nerves and sleeping as well  Please continue all other medications as before, and refills have been done if requested  - the xanax  Please have the pharmacy call with any other refills you may need.  Please continue your efforts at being more active, low cholesterol diet, and weight control.  Please keep your appointments with your specialists as you may have planned  Please go to the LAB in the Basement (turn left off the elevator) for the tests to be done today  You will be contacted by phone if any changes need to be made immediately.  Otherwise, you will receive a letter about your results with an explanation, but please check with MyChart first.  Please remember to sign up for MyChart if you have not done so, as this will be important to you in the future with finding out test results, communicating by private email, and scheduling acute appointments online when needed.   Please return in 6 months, or sooner if needed

## 2014-02-04 NOTE — Progress Notes (Signed)
Pre visit review using our clinic review tool, if applicable. No additional management support is needed unless otherwise documented below in the visit note. 

## 2014-02-04 NOTE — Progress Notes (Signed)
Subjective:    Patient ID: Danny Hopkins, male    DOB: [birth year], 53 y.o.   MRN: [id number]  HPI  Here to f/u; overall doing ok,  Pt denies chest pain, increased sob or doe, wheezing, orthopnea, PND, increased LE swelling, palpitations, dizziness or syncope.  Pt denies polydipsia, polyuria, or low sugar symptoms such as weakness or confusion improved with po intake.  Pt denies new neurological symptoms such as new headache, or facial or extremity weakness or numbness.   Pt states overall good compliance with meds, has been trying to follow lower cholesterol diet, with wt overall stable.  Wt Readings from Last 3 Encounters:  02/04/14 153 lb 8 oz (69.627 kg)  09/24/13 164 lb (74.39 kg)  09/10/13 164 lb 3.2 oz (74.481 kg)  Denies worsening depressive symptoms, suicidal ideation, or panic; has ongoing anxiety, mild worse overall recently in last few months with ongoing stressors. Past Medical History  Diagnosis Date  . Stroke   . Arthritis   . History of chicken pox   . 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 Cervical staph infection      2006  . Inguinal hernia repair      kindergarten    reports that he has never smoked. He has never used smokeless tobacco. He reports that he drinks about 1.2 oz of alcohol per week. He reports that he uses illicit drugs (Marijuana). family history is negative for Stomach cancer, Rectal cancer, Pancreatic cancer, and Colon cancer. No Known Allergies Current Outpatient Prescriptions on File Prior to Visit  Medication Sig Dispense Refill  . atorvastatin (LIPITOR) 20 MG tablet Take 1 tablet (20 mg total) by mouth daily. 90 tablet 3  . baclofen (LIORESAL) 10 MG tablet Take 10 mg by mouth 2 (two) times daily.     Marland Kitchen HYDROcodone-acetaminophen (NORCO) 10-325 MG per tablet Take 1 tablet by mouth every 8 (eight) hours as needed.     . sildenafil (VIAGRA) 100 MG tablet Take 0.5-1 tablets  (50-100 mg total) by mouth daily as needed for erectile dysfunction. 5 tablet 11  . tamsulosin (FLOMAX) 0.4 MG CAPS capsule Take 1 capsule (0.4 mg total) by mouth daily. 90 capsule 3   No current facility-administered medications on file prior to visit.   Review of Systems  Constitutional: Negative for unusual diaphoresis or other sweats  HENT: Negative for ringing in ear Eyes: Negative for double vision or worsening visual disturbance.  Respiratory: Negative for choking and stridor.   Gastrointestinal: Negative for vomiting or other signifcant bowel change Genitourinary: Negative for hematuria or decreased urine volume.  Musculoskeletal: Negative for other MSK pain or swelling Skin: Negative for color change and worsening wound.  Neurological: Negative for tremors and numbness other than noted  Psychiatric/Behavioral: Negative for decreased concentration or agitation other than above       Objective:   Physical Exam BP 106/72 mmHg  Pulse 70  Temp(Src) 98.6 F (37 C) (Oral)  Ht 5\' 7"  (1.702 m)  Wt 153 lb 8 oz (69.627 kg)  BMI 24.04 kg/m2  SpO2 97% VS noted,  Constitutional: Pt appears well-developed, well-nourished.  HENT: Head: NCAT.  Right Ear: External ear normal.  Left Ear: External ear normal.  Eyes: . Pupils are equal, round, and reactive to light. Conjunctivae and EOM are normal Neck: Normal range of motion. Neck supple.  Cardiovascular: Normal rate and regular rhythm.   Pulmonary/Chest: Effort normal  and breath sounds normal.  Abd:  Soft, NT, ND, + BS Neurological: Pt is alert. Not confused , motor grossly intact Skin: Skin is warm. + worsening eczematous changes to hands/palms Psychiatric: Pt behavior is normal. No agitation. mod nervous    Assessment & Plan:

## 2014-02-06 DIAGNOSIS — M4712 Other spondylosis with myelopathy, cervical region: Secondary | ICD-10-CM | POA: Diagnosis not present

## 2014-02-06 DIAGNOSIS — Z6830 Body mass index (BMI) 30.0-30.9, adult: Secondary | ICD-10-CM | POA: Diagnosis not present

## 2014-02-09 NOTE — Assessment & Plan Note (Signed)
Mild to mod, for add lexapro 10 qd, also xanax refill prn,  to f/u any worsening symptoms or concerns

## 2014-02-09 NOTE — Assessment & Plan Note (Signed)
For med refill, consider derm referral

## 2014-02-09 NOTE — Assessment & Plan Note (Addendum)
With hx of stroke, recent data reviewed with pt, has been working on lower chol diet, for f/u lab, cont diet Lab Results  Component Value Date   LDLCALC 163* 07/24/2013  f

## 2014-02-09 NOTE — Assessment & Plan Note (Signed)
Mild to mod, for med refill,  to f/u any worsening symptoms or concerns

## 2014-02-09 NOTE — Assessment & Plan Note (Signed)
Also for psa as he is due 

## 2014-03-03 ENCOUNTER — Other Ambulatory Visit (INDEPENDENT_AMBULATORY_CARE_PROVIDER_SITE_OTHER): Payer: Medicare Other

## 2014-03-03 DIAGNOSIS — N320 Bladder-neck obstruction: Secondary | ICD-10-CM

## 2014-03-03 DIAGNOSIS — E785 Hyperlipidemia, unspecified: Secondary | ICD-10-CM | POA: Diagnosis not present

## 2014-03-03 LAB — LIPID PANEL
Cholesterol: 139 mg/dL (ref 0–200)
HDL: 47.7 mg/dL (ref >39.00)
LDL CALC: 76 mg/dL (ref 0–99)
NonHDL: 91.3
TRIGLYCERIDES: 75 mg/dL (ref 0.0–149.0)
Total CHOL/HDL Ratio: 3
VLDL: 15 mg/dL (ref 0.0–40.0)

## 2014-03-03 LAB — HEPATIC FUNCTION PANEL
ALT: 16 U/L (ref 0–53)
AST: 17 U/L (ref 0–37)
Albumin: 3.8 g/dL (ref 3.5–5.2)
Alkaline Phosphatase: 65 U/L (ref 39–117)
Bilirubin, Direct: 0.2 mg/dL (ref 0.0–0.3)
TOTAL PROTEIN: 6.2 g/dL (ref 6.0–8.3)
Total Bilirubin: 1.3 mg/dL — ABNORMAL HIGH (ref 0.2–1.2)

## 2014-03-03 LAB — PSA: PSA: 0.36 ng/mL (ref 0.10–4.00)

## 2014-03-04 ENCOUNTER — Encounter: Payer: Self-pay | Admitting: Internal Medicine

## 2014-05-23 ENCOUNTER — Telehealth: Payer: Self-pay | Admitting: Internal Medicine

## 2014-05-27 NOTE — Telephone Encounter (Signed)
Done hardcopy to [facility]  

## 2014-05-27 NOTE — Telephone Encounter (Signed)
Pt called to check up on this request.  °

## 2014-05-28 NOTE — Telephone Encounter (Signed)
Called pt. To verify pharmacy. [facility] [address] [facility] patient's request. Rx done.

## 2014-07-09 ENCOUNTER — Encounter: Payer: Self-pay | Admitting: Internal Medicine

## 2014-07-09 ENCOUNTER — Ambulatory Visit (INDEPENDENT_AMBULATORY_CARE_PROVIDER_SITE_OTHER): Payer: Medicare Other | Admitting: Internal Medicine

## 2014-07-09 VITALS — BP 116/80 | HR 52 | Temp 97.8°F | Resp 18 | Ht 67.0 in | Wt 159.1 lb

## 2014-07-09 DIAGNOSIS — Z0000 Encounter for general adult medical examination without abnormal findings: Secondary | ICD-10-CM

## 2014-07-09 NOTE — Progress Notes (Signed)
Subjective:    Patient ID: Danny Hopkins, male    DOB: November 10, 1960, 54 y.o.   MRN: [id number]  HPI  Here for wellness and f/u;  Overall doing ok;  Pt denies Chest pain, worsening SOB, DOE, wheezing, orthopnea, PND, worsening LE edema, palpitations, dizziness or syncope.  Pt denies neurological change such as new headache, facial or extremity weakness.  Pt denies polydipsia, polyuria, or low sugar symptoms. Pt states overall good compliance with treatment and medications, good tolerability, and has been trying to follow appropriate diet.  Pt denies worsening depressive symptoms, suicidal ideation or panic. No fever, night sweats, wt loss, loss of appetite, or other constitutional symptoms.  Pt states good ability with ADL's, has low fall risk, home safety reviewed and adequate, no other significant changes in hearing or vision, and only occasionally active with exercise.  No current complaints Past Medical History  Diagnosis Date  . Stroke   . Arthritis   . History of chicken pox   . 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 Cervical staph infection      2006  . Inguinal hernia repair      kindergarten    reports that he has never smoked. He has never used smokeless tobacco. He reports that he drinks about 1.2 oz of alcohol per week. He reports that he uses illicit drugs (Marijuana). family history is negative for Stomach cancer, Rectal cancer, Pancreatic cancer, and Colon cancer. No Known Allergies Current Outpatient Prescriptions on File Prior to Visit  Medication Sig Dispense Refill  . ALPRAZolam (XANAX) 1 MG tablet TAKE 1 TABLET BY MOUTH TWICE A DAY AS NEEDED 60 tablet 2  . atorvastatin (LIPITOR) 20 MG tablet Take 1 tablet (20 mg total) by mouth daily. 90 tablet 3  . baclofen (LIORESAL) 10 MG tablet Take 10 mg by mouth 2 (two) times daily.     . clotrimazole-betamethasone (LOTRISONE) cream Apply topically 2 (two)  times daily. 30 g 1  . HYDROcodone-acetaminophen (NORCO) 10-325 MG per tablet Take 1 tablet by mouth every 8 (eight) hours as needed.     . sildenafil (VIAGRA) 100 MG tablet Take 0.5-1 tablets (50-100 mg total) by mouth daily as needed for erectile dysfunction. 5 tablet 11  . tamsulosin (FLOMAX) 0.4 MG CAPS capsule Take 1 capsule (0.4 mg total) by mouth daily. 90 capsule 3   No current facility-administered medications on file prior to visit.   Review of Systems Constitutional: Negative for increased diaphoresis, other activity, appetite or siginficant weight change other than noted HENT: Negative for worsening hearing loss, ear pain, facial swelling, mouth sores and neck stiffness.   Eyes: Negative for other worsening pain, redness or visual disturbance.  Respiratory: Negative for shortness of breath and wheezing  Cardiovascular: Negative for chest pain and palpitations.  Gastrointestinal: Negative for diarrhea, blood in stool, abdominal distention or other pain Genitourinary: Negative for hematuria, flank pain or change in urine volume.  Musculoskeletal: Negative for myalgias or other joint complaints.  Skin: Negative for color change and wound or drainage.  Neurological: Negative for syncope and numbness. other than noted Hematological: Negative for adenopathy. or other swelling Psychiatric/Behavioral: Negative for hallucinations, SI, self-injury, decreased concentration or other worsening agitation.      Objective:   Physical Exam BP 116/80 mmHg  Pulse 52  Temp(Src) 97.8 F (36.6 C) (Oral)  Resp 18  Ht 5\' 7"  (1.702 m)  Wt 159 lb  1.9 oz (72.176 kg)  BMI 24.92 kg/m2  SpO2 95% VS noted,  Constitutional: Pt is oriented to person, place, and time. Appears well-developed and well-nourished, in no significant distress Head: Normocephalic and atraumatic.  Right Ear: External ear normal.  Left Ear: External ear normal.  Nose: Nose normal.  Mouth/Throat: Oropharynx is clear and  moist.  Eyes: Conjunctivae and EOM are normal. Pupils are equal, round, and reactive to light.  Neck: Normal range of motion. Neck supple. No JVD present. No tracheal deviation present or significant neck LA or mass Cardiovascular: Normal rate, regular rhythm, normal heart sounds and intact distal pulses.   Pulmonary/Chest: Effort normal and breath sounds without rales or wheezing  Abdominal: Soft. Bowel sounds are normal. NT. No HSM  Musculoskeletal: Normal range of motion. Exhibits no edema.  Lymphadenopathy:  Has no cervical adenopathy.  Neurological: Pt is alert and oriented to person, place, and time. Pt has normal reflexes. No cranial nerve deficit. Motor no change from previous Skin: Skin is warm and dry. No rash noted.  Psychiatric:  Has normal mood and affect. Behavior is normal.     Assessment & Plan:

## 2014-07-09 NOTE — Patient Instructions (Signed)
Please continue all other medications as before, and refills have been done if requested.  Please have the pharmacy call with any other refills you may need.  Please continue your efforts at being more active, low cholesterol diet, and weight control.  You are otherwise up to date with prevention measures today.  Please keep your appointments with your specialists as you may have planned  Please return in 1 year for your yearly visit, or sooner if needed 

## 2014-07-09 NOTE — Progress Notes (Signed)
Pre visit review using our clinic review tool, if applicable. No additional management support is needed unless otherwise documented below in the visit note. 

## 2014-07-10 NOTE — Assessment & Plan Note (Signed)

## 2014-08-07 DIAGNOSIS — M4712 Other spondylosis with myelopathy, cervical region: Secondary | ICD-10-CM | POA: Diagnosis not present

## 2014-08-07 DIAGNOSIS — Z6830 Body mass index (BMI) 30.0-30.9, adult: Secondary | ICD-10-CM | POA: Diagnosis not present

## 2014-08-19 ENCOUNTER — Other Ambulatory Visit: Payer: Self-pay | Admitting: Internal Medicine

## 2014-08-19 NOTE — Telephone Encounter (Signed)
Rx faxed to pharmacy  

## 2014-08-19 NOTE — Telephone Encounter (Signed)
Done hardcopy to Dahlia  

## 2014-08-19 NOTE — Telephone Encounter (Signed)
Pt called in and needs refill on his ALPRAZolam (XANAX) 1 MG tablet [[id number]]

## 2014-08-20 ENCOUNTER — Other Ambulatory Visit: Payer: Self-pay | Admitting: Internal Medicine

## 2014-08-20 ENCOUNTER — Telehealth: Payer: Self-pay | Admitting: Internal Medicine

## 2014-08-20 NOTE — Telephone Encounter (Signed)
Rx verified at pharmacy, pt advised of same

## 2014-08-20 NOTE — Telephone Encounter (Signed)
Xanax rx already done yesterday

## 2014-08-20 NOTE — Telephone Encounter (Signed)
Pt called in about refill on his Xanax.  Has it been sent in?    [facility] [address]

## 2014-08-26 ENCOUNTER — Other Ambulatory Visit: Payer: Self-pay | Admitting: Internal Medicine

## 2014-09-20 ENCOUNTER — Other Ambulatory Visit: Payer: Self-pay | Admitting: Internal Medicine

## 2014-09-22 NOTE — Telephone Encounter (Signed)
Patient called in regarding this medication.

## 2014-11-10 ENCOUNTER — Telehealth: Payer: Self-pay | Admitting: Internal Medicine

## 2014-11-10 DIAGNOSIS — L309 Dermatitis, unspecified: Secondary | ICD-10-CM

## 2014-11-10 MED ORDER — CLOTRIMAZOLE-BETAMETHASONE 1-0.05 % EX CREA: TOPICAL_CREAM | Freq: Two times a day (BID) | CUTANEOUS | Status: DC

## 2014-11-10 NOTE — Telephone Encounter (Signed)
[facility] 

## 2014-11-10 NOTE — Telephone Encounter (Signed)
Is requesting refill on lotrisone cream to be [facility] to [facility] [address] .

## 2014-11-18 ENCOUNTER — Other Ambulatory Visit: Payer: Self-pay | Admitting: Internal Medicine

## 2014-11-18 NOTE — Telephone Encounter (Signed)
Done hardcopy to Dahlia  

## 2014-11-19 NOTE — Telephone Encounter (Signed)
Rx faxed to pharmacy  

## 2014-12-22 ENCOUNTER — Other Ambulatory Visit: Payer: Self-pay | Admitting: Internal Medicine

## 2015-01-20 ENCOUNTER — Encounter: Payer: Self-pay | Admitting: Internal Medicine

## 2015-01-20 ENCOUNTER — Ambulatory Visit (INDEPENDENT_AMBULATORY_CARE_PROVIDER_SITE_OTHER): Payer: Medicare Other | Admitting: Internal Medicine

## 2015-01-20 VITALS — BP 110/60 | HR 59 | Temp 98.0°F | Resp 16 | Wt 146.0 lb

## 2015-01-20 DIAGNOSIS — N481 Balanitis: Secondary | ICD-10-CM

## 2015-01-20 DIAGNOSIS — Z23 Encounter for immunization: Secondary | ICD-10-CM

## 2015-01-20 DIAGNOSIS — R300 Dysuria: Secondary | ICD-10-CM | POA: Diagnosis not present

## 2015-01-20 LAB — POCT URINALYSIS DIPSTICK
BILIRUBIN UA: NEGATIVE
Glucose, UA: NEGATIVE
KETONES UA: NEGATIVE
Nitrite, UA: NEGATIVE
PH UA: 6
Protein, UA: NEGATIVE
RBC UA: 10
Spec Grav, UA: 1.025
Urobilinogen, UA: 0.2

## 2015-01-20 MED ORDER — CIPROFLOXACIN HCL 500 MG PO TABS: 500.0000 mg | ORAL_TABLET | Freq: Two times a day (BID) | ORAL | Status: DC

## 2015-01-20 MED ORDER — AZITHROMYCIN 250 MG PO TABS: ORAL_TABLET | ORAL | Status: DC

## 2015-01-20 NOTE — Progress Notes (Signed)
Pre visit review using our clinic review tool, if applicable. No additional management support is needed unless otherwise documented below in the visit note. 

## 2015-01-20 NOTE — Patient Instructions (Addendum)
You had the flu shot today  Please take all new medication as prescribed - the two antibiotics  Please continue all other medications as before, and refills have been done if requested.  Please have the pharmacy call with any other refills you may need.  Please keep your appointments with your specialists as you may have planned

## 2015-01-20 NOTE — Progress Notes (Signed)
Subjective:    Patient ID: Danny Hopkins, male    DOB: 1960/04/12, 54 y.o.   MRN: [id number]  HPI  Here with 1 wk onset dysuria and freq, with very small occas leakage and/or pus at end of penis, no fever, and Denies urinary symptoms such as urgency, flank pain, hematuria or n/v.  He is wondering about UTI and STD. Pt denies chest pain, increased sob or doe, wheezing, orthopnea, PND, increased LE swelling, palpitations, dizziness or syncope.  Pt denies new neurological symptoms such as new headache, or facial or extremity weakness or numbness   Pt denies polydipsia, polyuria Past Medical History  Diagnosis Date  . Stroke ([place])   . Arthritis   . History of chicken pox   . 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 Cervical staph infection      2006  . Inguinal hernia repair      kindergarten    reports that he has never smoked. He has never used smokeless tobacco. He reports that he drinks about 1.2 oz of alcohol per week. He reports that he uses illicit drugs (Marijuana). family history is negative for Stomach cancer, Rectal cancer, Pancreatic cancer, and Colon cancer. No Known Allergies Current Outpatient Prescriptions on File Prior to Visit  Medication Sig Dispense Refill  . ALPRAZolam (XANAX) 1 MG tablet TAKE 1 TABLET TWICE A DAY AS NEEDED 60 tablet 2  . atorvastatin (LIPITOR) 20 MG tablet TAKE 1 TABLET (20 MG TOTAL) BY MOUTH DAILY. 90 tablet 3  . baclofen (LIORESAL) 10 MG tablet Take 10 mg by mouth 2 (two) times daily.     . clotrimazole-betamethasone (LOTRISONE) cream Apply topically 2 (two) times daily. 30 g 1  . HYDROcodone-acetaminophen (NORCO) 10-325 MG per tablet Take 1 tablet by mouth every 8 (eight) hours as needed.     . sildenafil (VIAGRA) 100 MG tablet Take 0.5-1 tablets (50-100 mg total) by mouth daily as needed for erectile dysfunction. 5 tablet 11  . tamsulosin (FLOMAX) 0.4 MG CAPS capsule Take  1 capsule (0.4 mg total) by mouth daily. 90 capsule 2   No current facility-administered medications on file prior to visit.   Review of Systems  All otherwise neg per pt      Objective:   Physical Exam BP 110/60 mmHg  Pulse 59  Temp(Src) 98 F (36.7 C) (Oral)  Resp 16  Wt 146 lb (66.225 kg)  SpO2 98% VS noted,  Constitutional: Pt appears in no significant distress HENT: Head: NCAT.  Right Ear: External ear normal.  Left Ear: External ear normal.  Eyes: . Pupils are equal, round, and reactive to light. Conjunctivae and EOM are normal Neck: Normal range of motion. Neck supple.  Cardiovascular: Normal rate and regular rhythm.   Pulmonary/Chest: Effort normal and breath sounds without rales or wheezing.  Abd:  Soft, NT, ND, + BS GU: normal male without urethral d/c or testicle swelling/tender, does have glans penis erythema/irritative slight swelling Neurological: Pt is alert. Not confused , motor grossly intact Skin: Skin is warm. No rash, no LE edema Psychiatric: Pt behavior is normal. No agitation.   POCT urinalysis dipstick  Status: Finalresult Visible to patient:  Not Released Nextappt: None Dx:  Burning with urination              Ref Range 6:33 PM (01/20/15) 55yr ago (07/24/13) 7yr ago (04/19/13)    Color, UA  yellow  Clarity, UA  cloudy      Glucose, UA  neg      Bilirubin, UA  neg      Ketones, UA  neg      Spec Grav, UA  1.025      Blood, UA  10      pH, UA  6.0      Protein, UA  neg      Urobilinogen, UA  0.2 0.2 0.2    Nitrite, UA  neg      Leukocytes, UA Negative  large (3+) (A)              Assessment & Plan:

## 2015-01-25 DIAGNOSIS — N481 Balanitis: Secondary | ICD-10-CM | POA: Insufficient documentation

## 2015-01-25 DIAGNOSIS — R300 Dysuria: Secondary | ICD-10-CM | POA: Insufficient documentation

## 2015-01-25 NOTE — Assessment & Plan Note (Signed)
Also for lotrisone prn,  to f/u any worsening symptoms or concerns

## 2015-01-25 NOTE — Assessment & Plan Note (Signed)
Exam and udip c/w infection, unable to send for cx at this hour, for cipro empiric,  to f/u any worsening symptoms or concerns

## 2015-02-18 ENCOUNTER — Telehealth: Payer: Self-pay | Admitting: *Deleted

## 2015-02-18 MED ORDER — ALPRAZOLAM 1 MG PO TABS: 1.0000 mg | ORAL_TABLET | Freq: Two times a day (BID) | ORAL | Status: DC | PRN

## 2015-02-18 NOTE — Telephone Encounter (Signed)
[facility] 

## 2015-02-18 NOTE — Telephone Encounter (Signed)
Left msg requesting refill on his xanax [facility]

## 2015-02-18 NOTE — Telephone Encounter (Signed)
Done hardcopy to Dahlia  

## 2015-03-12 DIAGNOSIS — M4712 Other spondylosis with myelopathy, cervical region: Secondary | ICD-10-CM | POA: Diagnosis not present

## 2015-04-20 ENCOUNTER — Telehealth: Payer: Self-pay | Admitting: *Deleted

## 2015-04-20 DIAGNOSIS — L309 Dermatitis, unspecified: Secondary | ICD-10-CM

## 2015-04-20 MED ORDER — CLOTRIMAZOLE-BETAMETHASONE 1-0.05 % EX CREA: TOPICAL_CREAM | Freq: Two times a day (BID) | CUTANEOUS | Status: DC

## 2015-04-20 NOTE — Telephone Encounter (Signed)
Receive call pt is requesting refill on Lotrisone cream. Inform [facility]

## 2015-05-14 ENCOUNTER — Other Ambulatory Visit: Payer: Self-pay | Admitting: Internal Medicine

## 2015-05-14 NOTE — Telephone Encounter (Signed)
Done hardcopy to Corinne  

## 2015-05-14 NOTE — Telephone Encounter (Signed)
Please advise patient needs refill on xanax

## 2015-05-15 NOTE — Telephone Encounter (Signed)
[facility] 

## 2015-06-24 ENCOUNTER — Other Ambulatory Visit (INDEPENDENT_AMBULATORY_CARE_PROVIDER_SITE_OTHER): Payer: Medicare Other

## 2015-06-24 ENCOUNTER — Ambulatory Visit (INDEPENDENT_AMBULATORY_CARE_PROVIDER_SITE_OTHER)
Admission: RE | Admit: 2015-06-24 | Discharge: 2015-06-24 | Disposition: A | Discharge status: Home / Self Care | Payer: Medicare Other | Source: Ambulatory Visit | Attending: Internal Medicine | Admitting: Internal Medicine

## 2015-06-24 ENCOUNTER — Encounter: Payer: Self-pay | Admitting: Internal Medicine

## 2015-06-24 ENCOUNTER — Ambulatory Visit (INDEPENDENT_AMBULATORY_CARE_PROVIDER_SITE_OTHER): Payer: Medicare Other | Admitting: Internal Medicine

## 2015-06-24 VITALS — BP 110/70 | HR 61 | Temp 97.7°F | Resp 20 | Wt 146.0 lb

## 2015-06-24 DIAGNOSIS — N320 Bladder-neck obstruction: Secondary | ICD-10-CM | POA: Diagnosis not present

## 2015-06-24 DIAGNOSIS — Z1159 Encounter for screening for other viral diseases: Secondary | ICD-10-CM

## 2015-06-24 DIAGNOSIS — E785 Hyperlipidemia, unspecified: Secondary | ICD-10-CM | POA: Diagnosis not present

## 2015-06-24 DIAGNOSIS — M62838 Other muscle spasm: Secondary | ICD-10-CM | POA: Insufficient documentation

## 2015-06-24 DIAGNOSIS — I889 Nonspecific lymphadenitis, unspecified: Secondary | ICD-10-CM

## 2015-06-24 DIAGNOSIS — L210 Seborrhea capitis: Secondary | ICD-10-CM

## 2015-06-24 DIAGNOSIS — L219 Seborrheic dermatitis, unspecified: Secondary | ICD-10-CM

## 2015-06-24 DIAGNOSIS — L040 Acute lymphadenitis of face, head and neck: Secondary | ICD-10-CM | POA: Diagnosis not present

## 2015-06-24 DIAGNOSIS — R030 Elevated blood-pressure reading, without diagnosis of hypertension: Secondary | ICD-10-CM | POA: Insufficient documentation

## 2015-06-24 LAB — CBC WITH DIFFERENTIAL/PLATELET
BASOS ABS: 0.1 10*3/uL (ref 0.0–0.1)
Basophils Relative: 0.8 % (ref 0.0–3.0)
EOS ABS: 0.2 10*3/uL (ref 0.0–0.7)
Eosinophils Relative: 2.2 % (ref 0.0–5.0)
HCT: 50.5 % (ref 39.0–52.0)
HEMOGLOBIN: 17.1 g/dL — AB (ref 13.0–17.0)
Lymphocytes Relative: 42.6 % (ref 12.0–46.0)
Lymphs Abs: 2.9 10*3/uL (ref 0.7–4.0)
MCHC: 34 g/dL (ref 30.0–36.0)
MCV: 89.6 fl (ref 78.0–100.0)
MONO ABS: 0.8 10*3/uL (ref 0.1–1.0)
Monocytes Relative: 12.1 % — ABNORMAL HIGH (ref 3.0–12.0)
Neutro Abs: 2.9 10*3/uL (ref 1.4–7.7)
Neutrophils Relative %: 42.3 % — ABNORMAL LOW (ref 43.0–77.0)
Platelets: 198 10*3/uL (ref 150.0–400.0)
RBC: 5.63 Mil/uL (ref 4.22–5.81)
RDW: 14.5 % (ref 11.5–15.5)
WBC: 6.8 10*3/uL (ref 4.0–10.5)

## 2015-06-24 LAB — TSH: TSH: 0.89 u[IU]/mL (ref 0.35–4.50)

## 2015-06-24 LAB — LIPID PANEL
CHOLESTEROL: 139 mg/dL (ref 0–200)
HDL: 51.1 mg/dL (ref >39.00)
LDL CALC: 72 mg/dL (ref 0–99)
NonHDL: 87.77
TRIGLYCERIDES: 78 mg/dL (ref 0.0–149.0)
Total CHOL/HDL Ratio: 3
VLDL: 15.6 mg/dL (ref 0.0–40.0)

## 2015-06-24 LAB — BASIC METABOLIC PANEL
BUN: 15 mg/dL (ref 6–23)
CO2: 30 mEq/L (ref 19–32)
Calcium: 9.9 mg/dL (ref 8.4–10.5)
Chloride: 104 mEq/L (ref 96–112)
Creatinine, Ser: 0.99 mg/dL (ref 0.40–1.50)
GFR: 101.67 mL/min (ref >60.00)
GLUCOSE: 81 mg/dL (ref 70–99)
POTASSIUM: 4.6 meq/L (ref 3.5–5.1)
Sodium: 141 mEq/L (ref 135–145)

## 2015-06-24 LAB — PSA: PSA: 0.36 ng/mL (ref 0.10–4.00)

## 2015-06-24 LAB — URINALYSIS, ROUTINE W REFLEX MICROSCOPIC
Bilirubin Urine: NEGATIVE
Hgb urine dipstick: NEGATIVE
KETONES UR: NEGATIVE
NITRITE: NEGATIVE
PH: 5.5 (ref 5.0–8.0)
SPECIFIC GRAVITY, URINE: 1.025 (ref 1.000–1.030)
Total Protein, Urine: NEGATIVE
URINE GLUCOSE: NEGATIVE
Urobilinogen, UA: 0.2 (ref 0.0–1.0)

## 2015-06-24 LAB — HEPATIC FUNCTION PANEL
ALBUMIN: 4.4 g/dL (ref 3.5–5.2)
ALT: 19 U/L (ref 0–53)
AST: 19 U/L (ref 0–37)
Alkaline Phosphatase: 76 U/L (ref 39–117)
Bilirubin, Direct: 0.2 mg/dL (ref 0.0–0.3)
Total Bilirubin: 1.3 mg/dL — ABNORMAL HIGH (ref 0.2–1.2)
Total Protein: 6.7 g/dL (ref 6.0–8.3)

## 2015-06-24 LAB — HEPATITIS C ANTIBODY: HCV Ab: NEGATIVE

## 2015-06-24 MED ORDER — AZITHROMYCIN 250 MG PO TABS: ORAL_TABLET | ORAL | Status: DC

## 2015-06-24 MED ORDER — BACLOFEN 10 MG PO TABS: 10.0000 mg | ORAL_TABLET | Freq: Three times a day (TID) | ORAL | Status: DC | PRN

## 2015-06-24 MED ORDER — KETOCONAZOLE 2 % EX SHAM: 1.0000 "application " | MEDICATED_SHAMPOO | CUTANEOUS | Status: DC

## 2015-06-24 NOTE — Patient Instructions (Addendum)
Please take all new medication as prescribed - the antibiotic, and the shampoo  Please continue all other medications as before, and refills have been done if requested.  Please have the pharmacy call with any other refills you may need.  Please continue your efforts at being more active, low cholesterol diet, and weight control.  You are otherwise up to date with prevention measures today.  Please keep your appointments with your specialists as you may have planned   Please go to the XRAY Department in the Basement (go straight as you get off the elevator) for the x-ray testing  Please go to the LAB in the Basement (turn left off the elevator) for the tests to be done today  You will be contacted by phone if any changes need to be made immediately.  Otherwise, you will receive a letter about your results with an explanation, but please check with MyChart first.  Please remember to sign up for MyChart if you have not done so, as this will be important to you in the future with finding out test results, communicating by private email, and scheduling acute appointments online when needed.  Please return in 6 months, or sooner if needed  OK to cancel the May appointment  Please schedule your Medicare annual wellness visit with Wynetta Fines, RN at your convenience  Please remember to call for referral to ENT if the knots do not go away  Please remember to call for referral to Dermatology if the scalp is not better

## 2015-06-24 NOTE — Progress Notes (Signed)
Subjective:    Patient ID: Danny Hopkins, male    DOB: Nov 11, 1960, 55 y.o.   MRN: [id number]  HPI   Here with 2-3 days acute onset fever, facial pain, pressure, headache, general weakness and malaise, and cearish d/c, with mild ST and cough, but pt denies chest pain, wheezing, increased sob or doe, orthopnea, PND, increased LE swelling, palpitations, dizziness or syncope. Does have right preuricular tender small nodule, as well as several to the neck as well.  Tried topical yogurt and thinks may have helped pain.  Also has marked dandruff with flaking for several months, otc topical not helping.  Denies worsening depressive symptoms, suicidal ideation, or panic; has ongoing anxiety, not increased recently.  Declines tetanus. Past Medical History  Diagnosis Date  . Stroke ([place])   . Arthritis   . History of chicken pox   . 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 Cervical staph infection      2006  . Inguinal hernia repair      kindergarten    reports that he has never smoked. He has never used smokeless tobacco. He reports that he drinks about 1.2 oz of alcohol per week. He reports that he uses illicit drugs (Marijuana). family history is negative for Stomach cancer, Rectal cancer, Pancreatic cancer, and Colon cancer. No Known Allergies Current Outpatient Prescriptions on File Prior to Visit  Medication Sig Dispense Refill  . ALPRAZolam (XANAX) 1 MG tablet TAKE 1 TABLET BY MOUTH TWICE A DAY AS NEEDED 60 tablet 2  . atorvastatin (LIPITOR) 20 MG tablet TAKE 1 TABLET (20 MG TOTAL) BY MOUTH DAILY. 90 tablet 3  . clotrimazole-betamethasone (LOTRISONE) cream Apply topically 2 (two) times daily. 30 g 0  . HYDROcodone-acetaminophen (NORCO) 10-325 MG per tablet Take 1 tablet by mouth every 8 (eight) hours as needed.     . sildenafil (VIAGRA) 100 MG tablet Take 0.5-1 tablets (50-100 mg total) by mouth daily as needed for  erectile dysfunction. 5 tablet 11  . tamsulosin (FLOMAX) 0.4 MG CAPS capsule Take 1 capsule (0.4 mg total) by mouth daily. 90 capsule 2   No current facility-administered medications on file prior to visit.   Review of Systems  Constitutional: Negative for unusual diaphoresis or night sweats HENT: Negative for ear swelling or discharge Eyes: Negative for worsening visual haziness  Respiratory: Negative for choking and stridor.   Gastrointestinal: Negative for distension or worsening eructation Genitourinary: Negative for retention or change in urine volume.  Musculoskeletal: Negative for other MSK pain or swelling Skin: Negative for color change and worsening wound Neurological: Negative for tremors and numbness other than noted  Psychiatric/Behavioral: Negative for decreased concentration or agitation other than above       Objective:   Physical Exam BP 110/70 mmHg  Pulse 61  Temp(Src) 97.7 F (36.5 C) (Oral)  Resp 20  Wt 146 lb (66.225 kg)  SpO2 97% VS noted,  Constitutional: Pt appears in no apparent distress HENT: Head: NCAT.  Right Ear: External ear normal.  Left Ear: External ear normal.  Bilat tm's with mild erythema.  Max sinus areas non tender.  Pharynx with mild erythema, no exudate Eyes: . Pupils are equal, round, and reactive to light. Conjunctivae and EOM are normal Neck: Normal range of motion. Neck supple. with `1 preauricular tender LN and several to the bilat pre and post SCM chains right > left neck Cardiovascular: Normal rate and  regular rhythm.   Pulmonary/Chest: Effort normal and breath sounds without rales or wheezing.  Abd:  Soft, NT, ND, + BS - no HSM Neurological: Pt is alert. Not confused , motor grossly intact Skin: Skin is warm. No rash, no LE edema, scalp with diffuse whitish flaky  Psychiatric: Pt behavior is normal. No agitation.     Assessment & Plan:

## 2015-06-24 NOTE — Progress Notes (Signed)
Pre visit review using our clinic review tool, if applicable. No additional management support is needed unless otherwise documented below in the visit note. 

## 2015-06-26 NOTE — Assessment & Plan Note (Signed)
Mild to mod, for ketoconozole shampoo asd,  to f/u any worsening symptoms or concerns, consdier derm referral

## 2015-06-26 NOTE — Assessment & Plan Note (Signed)
stable overall by history and exam, recent data reviewed with pt, and pt to continue medical treatment as before,  to f/u any worsening symptoms or concerns Lab Results  Component Value Date   LDLCALC 72 06/24/2015   Goal ldl < 70

## 2015-06-26 NOTE — Assessment & Plan Note (Signed)
Mild to mod, for antibx course,  to f/u any worsening symptoms or concerns, for cxr, and labs but suspect likely related to URI

## 2015-07-13 ENCOUNTER — Telehealth: Payer: Self-pay

## 2015-07-13 NOTE — Telephone Encounter (Signed)
Danny Hopkins called and and rescheduled his AWV from Wed to Tuesday at [facility] 04/18

## 2015-07-14 ENCOUNTER — Ambulatory Visit (INDEPENDENT_AMBULATORY_CARE_PROVIDER_SITE_OTHER): Payer: Medicare Other

## 2015-07-14 VITALS — BP 90/50 | Ht 67.0 in | Wt 145.2 lb

## 2015-07-14 DIAGNOSIS — Z0000 Encounter for general adult medical examination without abnormal findings: Secondary | ICD-10-CM
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
## 2015-07-14 NOTE — Patient Instructions (Addendum)
Danny Hopkins , Thank you for taking time to come for your Medicare Wellness Visit. I appreciate your ongoing commitment to your health goals. Please review the following plan we discussed and let me know if I can assist you in the future.   Will make eye referral for eye exam this year  Will have Tdap sometimes this year;   Will contact Dr. Annette Stable for Trail of PT as discussed   These are the goals we discussed: Goals    . Exercise 150 minutes per week (moderate activity)     Will discuss PT with doctor and plan in place to improve issues 1. Getting up from the floor; ( wood floor) 2. Bathroom safety 3. Right ankle strength; gives away; to review for ? Brace  4. Bending over to tie shoe         This is a list of the screening recommended for you and due dates:  Health Maintenance  Topic Date Due  . HIV Screening  05/25/1977  . Tetanus Vaccine  05/25/1981  . Flu Shot  10/27/2015  . Colon Cancer Screening  09/25/2018  .  Hepatitis C: One time screening is recommended by Center for Disease Control  (CDC) for  adults born from [birth year] through 1963.   Completed     Fall Prevention in the Home  Falls can cause injuries. They can happen to people of all ages. There are many things you can do to make your home safe and to help prevent falls.  WHAT CAN I DO ON THE OUTSIDE OF MY HOME?  Regularly fix the edges of walkways and driveways and fix any cracks.  Remove anything that might make you trip as you walk through a door, such as a raised step or threshold.  Trim any bushes or trees on the path to your home.  Use bright outdoor lighting.  Clear any walking paths of anything that might make someone trip, such as rocks or tools.  Regularly check to see if handrails are loose or broken. Make sure that both sides of any steps have handrails.  Any raised decks and porches should have guardrails on the edges.  Have any leaves, snow, or ice cleared regularly.  Use sand or salt on  walking paths during winter.  Clean up any spills in your garage right away. This includes oil or grease spills. WHAT CAN I DO IN THE BATHROOM?   Use night lights.  Install grab bars by the toilet and in the tub and shower. Do not use towel bars as grab bars.  Use non-skid mats or decals in the tub or shower.  If you need to sit down in the shower, use a plastic, non-slip stool.  Keep the floor dry. Clean up any water that spills on the floor as soon as it happens.  Remove soap buildup in the tub or shower regularly.  Attach bath mats securely with double-sided non-slip rug tape.  Do not have throw rugs and other things on the floor that can make you trip. WHAT CAN I DO IN THE BEDROOM?  Use night lights.  Make sure that you have a light by your bed that is easy to reach.  Do not use any sheets or blankets that are too big for your bed. They should not hang down onto the floor.  Have a firm chair that has side arms. You can use this for support while you get dressed.  Do not have throw rugs and other things  on the floor that can make you trip. WHAT CAN I DO IN THE KITCHEN?  Clean up any spills right away.  Avoid walking on wet floors.  Keep items that you use a lot in easy-to-reach places.  If you need to reach something above you, use a strong step stool that has a grab bar.  Keep electrical cords out of the way.  Do not use floor polish or wax that makes floors slippery. If you must use wax, use non-skid floor wax.  Do not have throw rugs and other things on the floor that can make you trip. WHAT CAN I DO WITH MY STAIRS?  Do not leave any items on the stairs.  Make sure that there are handrails on both sides of the stairs and use them. Fix handrails that are broken or loose. Make sure that handrails are as long as the stairways.  Check any carpeting to make sure that it is firmly attached to the stairs. Fix any carpet that is loose or worn.  Avoid having throw  rugs at the top or bottom of the stairs. If you do have throw rugs, attach them to the floor with carpet tape.  Make sure that you have a light switch at the top of the stairs and the bottom of the stairs. If you do not have them, ask someone to add them for you. WHAT ELSE CAN I DO TO HELP PREVENT FALLS?  Wear shoes that:  Do not have high heels.  Have rubber bottoms.  Are comfortable and fit you well.  Are closed at the toe. Do not wear sandals.  If you use a stepladder:  Make sure that it is fully opened. Do not climb a closed stepladder.  Make sure that both sides of the stepladder are locked into place.  Ask someone to hold it for you, if possible.  Clearly mark and make sure that you can see:  Any grab bars or handrails.  First and last steps.  Where the edge of each step is.  Use tools that help you move around (mobility aids) if they are needed. These include:  Canes.  Walkers.  Scooters.  Crutches.  Turn on the lights when you go into a dark area. Replace any light bulbs as soon as they burn out.  Set up your furniture so you have a clear path. Avoid moving your furniture around.  If any of your floors are uneven, fix them.  If there are any pets around you, be aware of where they are.  Review your medicines with your doctor. Some medicines can make you feel dizzy. This can increase your chance of falling. Ask your doctor what other things that you can do to help prevent falls.   This information is not intended to replace advice given to you by your health care provider. Make sure you discuss any questions you have with your health care provider.   Document Released: 01/08/2009 Document Revised: 07/29/2014 Document Reviewed: 04/18/2014 Elsevier Interactive Patient Education 2016 [employer]  Glaucoma Glaucoma happens when the fluid pressure in the eyeball is too high. If the pressure stays high for too long, the eye may become damaged. This can  cause a loss of vision. The most common type of glaucoma causes pressure in the eye to go up slowly. There may be no symptoms at first. Testing for this condition can help to find the condition before damage occurs. Early treatment can often stop vision loss. HOME CARE  Take medicines only as told by your doctor.  Use your eye drops exactly as told. You will probably need to use these for the rest of your life.  Exercise often. Talk with your doctor about which types of exercise are safe for you. Avoid standing on your head.  Keep all follow-up visits as told by your doctor. This is important. GET HELP IF:  Your symptoms get worse. GET HELP RIGHT AWAY IF:  You have bad pain in your eye.  You have vision problems.  You have a bad headache in the area around your eye.  You feel sick to your stomach (nauseous) or you throw up (vomit).  You start to have problems with your other eye.   This information is not intended to replace advice given to you by your health care provider. Make sure you discuss any questions you have with your health care provider.   Document Released: 12/22/2007 Document Revised: 04/04/2014 Document Reviewed: 12/24/2013 Elsevier Interactive Patient Education 2016 [place] Maintenance, Male A healthy lifestyle and preventative care can promote health and wellness.  Maintain regular health, dental, and eye exams.  Eat a healthy diet. Foods like vegetables, fruits, whole grains, low-fat dairy products, and lean protein foods contain the nutrients you need and are low in calories. Decrease your intake of foods high in solid fats, added sugars, and salt. Get information about a proper diet from your health care provider, if necessary.  Regular physical exercise is one of the most important things you can do for your health. Most adults should get at least 150 minutes of moderate-intensity exercise (any activity that increases your heart rate and causes  you to sweat) each week. In addition, most adults need muscle-strengthening exercises on 2 or more days a week.   Maintain a healthy weight. The body mass index (BMI) is a screening tool to identify possible weight problems. It provides an estimate of body fat based on height and weight. Your health care provider can find your BMI and can help you achieve or maintain a healthy weight. For males 20 years and older:  A BMI below 18.5 is considered underweight.  A BMI of 18.5 to 24.9 is normal.  A BMI of 25 to 29.9 is considered overweight.  A BMI of 30 and above is considered obese.  Maintain normal blood lipids and cholesterol by exercising and minimizing your intake of saturated fat. Eat a balanced diet with plenty of fruits and vegetables. Blood tests for lipids and cholesterol should begin at age 42 and be repeated every 5 years. If your lipid or cholesterol levels are high, you are over age 32, or you are at high risk for heart disease, you may need your cholesterol levels checked more frequently.Ongoing high lipid and cholesterol levels should be treated with medicines if diet and exercise are not working.  If you smoke, find out from your health care provider how to quit. If you do not use tobacco, do not start.  Lung cancer screening is recommended for adults aged 73-80 years who are at high risk for developing lung cancer because of a history of smoking. A yearly low-dose CT scan of the lungs is recommended for people who have at least a 30-pack-year history of smoking and are current smokers or have quit within the past 15 years. A pack year of smoking is smoking an average of 1 pack of cigarettes a day for 1 year (for example, a 30-pack-year history of smoking could  mean smoking 1 pack a day for 30 years or 2 packs a day for 15 years). Yearly screening should continue until the smoker has stopped smoking for at least 15 years. Yearly screening should be stopped for people who develop a  health problem that would prevent them from having lung cancer treatment.  If you choose to drink alcohol, do not have more than 2 drinks per day. One drink is considered to be 12 oz (360 mL) of beer, 5 oz (150 mL) of wine, or 1.5 oz (45 mL) of liquor.  Avoid the use of street drugs. Do not share needles with anyone. Ask for help if you need support or instructions about stopping the use of drugs.  High blood pressure causes heart disease and increases the risk of stroke. High blood pressure is more likely to develop in:  People who have blood pressure in the end of the normal range (100-139/85-89 mm Hg).  People who are overweight or obese.  People who are African American.  If you are 64-63 years of age, have your blood pressure checked every 3-5 years. If you are 12 years of age or older, have your blood pressure checked every year. You should have your blood pressure measured twice--once when you are at a hospital or clinic, and once when you are not at a hospital or clinic. Record the average of the two measurements. To check your blood pressure when you are not at a hospital or clinic, you can use:  An automated blood pressure machine at a pharmacy.  A home blood pressure monitor.  If you are 58-44 years old, ask your health care provider if you should take aspirin to prevent heart disease.  Diabetes screening involves taking a blood sample to check your fasting blood sugar level. This should be done once every 3 years after age 20 if you are at a normal weight and without risk factors for diabetes. Testing should be considered at a younger age or be carried out more frequently if you are overweight and have at least 1 risk factor for diabetes.  Colorectal cancer can be detected and often prevented. Most routine colorectal cancer screening begins at the age of 32 and continues through age 46. However, your health care provider may recommend screening at an earlier age if you have risk  factors for colon cancer. On a yearly basis, your health care provider may provide home test kits to check for hidden blood in the stool. A small camera at the end of a tube may be used to directly examine the colon (sigmoidoscopy or colonoscopy) to detect the earliest forms of colorectal cancer. Talk to your health care provider about this at age 25 when routine screening begins. A direct exam of the colon should be repeated every 5-10 years through age 86, unless early forms of precancerous polyps or small growths are found.  People who are at an increased risk for hepatitis B should be screened for this virus. You are considered at high risk for hepatitis B if:  You were born in a country where hepatitis B occurs often. Talk with your health care provider about which countries are considered high risk.  Your parents were born in a high-risk country and you have not received a shot to protect against hepatitis B (hepatitis B vaccine).  You have HIV or AIDS.  You use needles to inject street drugs.  You live with, or have sex with, someone who has hepatitis B.  You  are a man who has sex with other men (MSM).  You get hemodialysis treatment.  You take certain medicines for conditions like cancer, organ transplantation, and autoimmune conditions.  Hepatitis C blood testing is recommended for all people born from [birth year] through 1963 and any individual with known risk factors for hepatitis C.  Healthy men should no longer receive prostate-specific antigen (PSA) blood tests as part of routine cancer screening. Talk to your health care provider about prostate cancer screening.  Testicular cancer screening is not recommended for adolescents or adult males who have no symptoms. Screening includes self-exam, a health care provider exam, and other screening tests. Consult with your health care provider about any symptoms you have or any concerns you have about testicular cancer.  Practice safe sex.  Use condoms and avoid high-risk sexual practices to reduce the spread of sexually transmitted infections (STIs).  You should be screened for STIs, including gonorrhea and chlamydia if:  You are sexually active and are younger than 24 years.  You are older than 24 years, and your health care provider tells you that you are at risk for this type of infection.  Your sexual activity has changed since you were last screened, and you are at an increased risk for chlamydia or gonorrhea. Ask your health care provider if you are at risk.  If you are at risk of being infected with HIV, it is recommended that you take a prescription medicine daily to prevent HIV infection. This is called pre-exposure prophylaxis (PrEP). You are considered at risk if:  You are a man who has sex with other men (MSM).  You are a heterosexual man who is sexually active with multiple partners.  You take drugs by injection.  You are sexually active with a partner who has HIV.  Talk with your health care provider about whether you are at high risk of being infected with HIV. If you choose to begin PrEP, you should first be tested for HIV. You should then be tested every 3 months for as long as you are taking PrEP.  Use sunscreen. Apply sunscreen liberally and repeatedly throughout the day. You should seek shade when your shadow is shorter than you. Protect yourself by wearing long sleeves, pants, a wide-brimmed hat, and sunglasses year round whenever you are outdoors.  Tell your health care provider of new moles or changes in moles, especially if there is a change in shape or color. Also, tell your health care provider if a mole is larger than the size of a pencil eraser.  A one-time screening for abdominal aortic aneurysm (AAA) and surgical repair of large AAAs by ultrasound is recommended for men aged 75-75 years who are current or former smokers.  Stay current with your vaccines (immunizations).   This information is  not intended to replace advice given to you by your health care provider. Make sure you discuss any questions you have with your health care provider.   Document Released: 09/10/2007 Document Revised: 04/04/2014 Document Reviewed: 08/09/2010 Elsevier Interactive Patient Education [employer].

## 2015-07-15 ENCOUNTER — Encounter: Payer: Medicare Other | Admitting: Internal Medicine

## 2015-07-16 ENCOUNTER — Other Ambulatory Visit: Payer: Self-pay

## 2015-07-16 DIAGNOSIS — Z135 Encounter for screening for eye and ear disorders: Secondary | ICD-10-CM

## 2015-07-29 ENCOUNTER — Ambulatory Visit (INDEPENDENT_AMBULATORY_CARE_PROVIDER_SITE_OTHER): Payer: Medicare Other | Admitting: Internal Medicine

## 2015-07-29 ENCOUNTER — Encounter: Payer: Self-pay | Admitting: Internal Medicine

## 2015-07-29 VITALS — BP 126/76 | HR 64 | Resp 20 | Wt 144.0 lb

## 2015-07-29 DIAGNOSIS — L219 Seborrheic dermatitis, unspecified: Secondary | ICD-10-CM | POA: Diagnosis not present

## 2015-07-29 DIAGNOSIS — R51 Headache: Secondary | ICD-10-CM | POA: Diagnosis not present

## 2015-07-29 DIAGNOSIS — F411 Generalized anxiety disorder: Secondary | ICD-10-CM | POA: Diagnosis not present

## 2015-07-29 DIAGNOSIS — R519 Headache, unspecified: Secondary | ICD-10-CM

## 2015-07-29 DIAGNOSIS — L210 Seborrhea capitis: Secondary | ICD-10-CM

## 2015-07-29 MED ORDER — DOXYCYCLINE HYCLATE 100 MG PO TABS: 100.0000 mg | ORAL_TABLET | Freq: Two times a day (BID) | ORAL | Status: DC

## 2015-07-29 NOTE — Patient Instructions (Addendum)
Please take all new medication as prescribed - the antibiotic  Please call if you need a referral to dermatology  Please continue all other medications as before, and refills have been done if requested.  Please have the pharmacy call with any other refills you may need.  Please keep your appointments with your specialists as you may have planned

## 2015-07-29 NOTE — Progress Notes (Signed)
Pre visit review using our clinic review tool, if applicable. No additional management support is needed unless otherwise documented below in the visit note. 

## 2015-07-29 NOTE — Progress Notes (Signed)
Subjective:    Patient ID: Danny Hopkins, male    DOB: 1961/03/02, 55 y.o.   MRN: [id number]  HPI  Here to f/u, c/o new scalp issue, as dandruff has improved, but now with 2 areas of red/tender/swelling near the crown worse than before, without fever, drainage or prior hx of this.  Tends to cut the hair short but now starting to grow out.  Has hx of hair bumps to bear in the past, none now.Pt denies chest pain, increased sob or doe, wheezing, orthopnea, PND, increased LE swelling, palpitations, dizziness or syncope.  Pt denies new neurological symptoms such as new headache, or facial or extremity weakness or numbness  Denies worsening depressive symptoms, suicidal ideation, or panic; has ongoing anxiety, mild upset today that I am not able to "clear it out" with a procedure today Past Medical History  Diagnosis Date  . Stroke ([place])   . Arthritis   . History of chicken pox   . 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 Cervical staph infection      2006  . Inguinal hernia repair      kindergarten    reports that he has never smoked. He has never used smokeless tobacco. He reports that he drinks about 1.2 oz of alcohol per week. He reports that he uses illicit drugs (Marijuana). family history is negative for Stomach cancer, Rectal cancer, Pancreatic cancer, and Colon cancer. No Known Allergies Current Outpatient Prescriptions on File Prior to Visit  Medication Sig Dispense Refill  . ALPRAZolam (XANAX) 1 MG tablet TAKE 1 TABLET BY MOUTH TWICE A DAY AS NEEDED 60 tablet 2  . atorvastatin (LIPITOR) 20 MG tablet TAKE 1 TABLET (20 MG TOTAL) BY MOUTH DAILY. 90 tablet 3  . baclofen (LIORESAL) 10 MG tablet Take 1 tablet (10 mg total) by mouth 3 (three) times daily as needed for muscle spasms. 90 each 1  . clotrimazole-betamethasone (LOTRISONE) cream Apply topically 2 (two) times daily. 30 g 0  . HYDROcodone-acetaminophen  (NORCO) 10-325 MG per tablet Take 1 tablet by mouth every 8 (eight) hours as needed.     Marland Kitchen ketoconazole (NIZORAL) 2 % shampoo Apply 1 application topically 2 (two) times a week. 120 mL 3  . sildenafil (VIAGRA) 100 MG tablet Take 0.5-1 tablets (50-100 mg total) by mouth daily as needed for erectile dysfunction. 5 tablet 11  . tamsulosin (FLOMAX) 0.4 MG CAPS capsule Take 1 capsule (0.4 mg total) by mouth daily. 90 capsule 2   No current facility-administered medications on file prior to visit.   Review of Systems   Constitutional: Negative for unusual diaphoresis or night sweats HENT: Negative for ear swelling or discharge Eyes: Negative for worsening visual haziness  Respiratory: Negative for choking and stridor.   Gastrointestinal: Negative for distension or worsening eructation Genitourinary: Negative for retention or change in urine volume.  Musculoskeletal: Negative for other MSK pain or swelling Skin: Negative for color change and worsening wound Neurological: Negative for tremors and numbness other than noted  Psychiatric/Behavioral: Negative for decreased concentration or agitation other than above          Objective:   Physical Exam BP 126/76 mmHg  Pulse 64  Resp 20  Wt 144 lb (65.318 kg)  SpO2 96% VS noted,  Constitutional: Pt appears in no apparent distress HENT: Head: NCAT.  Right Ear: External ear normal.  Left Ear: External ear normal.  Eyes: .  Pupils are equal, round, and reactive to light. Conjunctivae and EOM are normal Neck: Normal range of motion. Neck supple.  Cardiovascular: Normal rate and regular rhythm.   Pulmonary/Chest: Effort normal and breath sounds without rales or wheezing.  Abd:  Soft, NT, ND, + BS Neurological: Pt is alert. Not confused , motor grossly intact Skin: Skin is warm. No rash, no LE edema but scalp with 2 areas approx 1/2 cm ear red/raised tender but nonfluctuant , no drainage, dandruff improved Psychiatric: Pt behavior is normal. No  agitation. 1+ nervous    Assessment & Plan:

## 2015-07-30 DIAGNOSIS — R51 Headache: Principal | ICD-10-CM

## 2015-07-30 DIAGNOSIS — R519 Headache, unspecified: Secondary | ICD-10-CM | POA: Insufficient documentation

## 2015-07-30 NOTE — Assessment & Plan Note (Signed)
2 small areas c/w ingrown hairs, now possible infeciton, for doxycycline course,  to f/u any worsening symptoms or concerns, consder derm referral

## 2015-07-30 NOTE — Assessment & Plan Note (Signed)
stable overall by history and exam, recent data reviewed with pt, and pt to continue medical treatment as before,  to f/u any worsening symptoms or concerns Lab Results  Component Value Date   WBC 6.8 06/24/2015   HGB 17.1* 06/24/2015   HCT 50.5 06/24/2015   PLT 198.0 06/24/2015   GLUCOSE 81 06/24/2015   CHOL 139 06/24/2015   TRIG 78.0 06/24/2015   HDL 51.10 06/24/2015   LDLCALC 72 06/24/2015   ALT 19 06/24/2015   AST 19 06/24/2015   NA 141 06/24/2015   K 4.6 06/24/2015   CL 104 06/24/2015   CREATININE 0.99 06/24/2015   BUN 15 06/24/2015   CO2 30 06/24/2015   TSH 0.89 06/24/2015   PSA 0.36 06/24/2015

## 2015-07-30 NOTE — Assessment & Plan Note (Signed)
Improved, to cont shampoo,  to f/u any worsening symptoms or concerns

## 2015-08-10 ENCOUNTER — Other Ambulatory Visit: Payer: Self-pay | Admitting: Internal Medicine

## 2015-08-11 NOTE — Telephone Encounter (Signed)
Pt lm on triage rq rf for the alprazolam. Pt has been out until Sunday.

## 2015-08-11 NOTE — Telephone Encounter (Signed)
Medication faxed to pharmacy 

## 2015-08-11 NOTE — Telephone Encounter (Signed)
Done hardcopy to Corinne  

## 2015-08-24 ENCOUNTER — Other Ambulatory Visit: Payer: Self-pay | Admitting: Internal Medicine

## 2015-08-25 DIAGNOSIS — H2513 Age-related nuclear cataract, bilateral: Secondary | ICD-10-CM | POA: Diagnosis not present

## 2015-08-25 DIAGNOSIS — H11139 Conjunctival pigmentations, unspecified eye: Secondary | ICD-10-CM | POA: Diagnosis not present

## 2015-08-28 ENCOUNTER — Emergency Department (HOSPITAL_BASED_OUTPATIENT_CLINIC_OR_DEPARTMENT_OTHER)
Admission: EM | Admit: 2015-08-28 | Discharge: 2015-08-28 | Disposition: A | Discharge status: Home / Self Care | Payer: Medicare Other | Attending: Emergency Medicine | Admitting: Emergency Medicine

## 2015-08-28 ENCOUNTER — Emergency Department (HOSPITAL_BASED_OUTPATIENT_CLINIC_OR_DEPARTMENT_OTHER): Payer: Medicare Other

## 2015-08-28 ENCOUNTER — Encounter (HOSPITAL_BASED_OUTPATIENT_CLINIC_OR_DEPARTMENT_OTHER): Payer: Self-pay | Admitting: *Deleted

## 2015-08-28 DIAGNOSIS — Z8673 Personal history of transient ischemic attack (TIA), and cerebral infarction without residual deficits: Secondary | ICD-10-CM | POA: Insufficient documentation

## 2015-08-28 DIAGNOSIS — M1990 Unspecified osteoarthritis, unspecified site: Secondary | ICD-10-CM | POA: Diagnosis not present

## 2015-08-28 DIAGNOSIS — Y929 Unspecified place or not applicable: Secondary | ICD-10-CM | POA: Diagnosis not present

## 2015-08-28 DIAGNOSIS — M25561 Pain in right knee: Secondary | ICD-10-CM | POA: Insufficient documentation

## 2015-08-28 DIAGNOSIS — W1830XA Fall on same level, unspecified, initial encounter: Secondary | ICD-10-CM | POA: Diagnosis not present

## 2015-08-28 DIAGNOSIS — Y999 Unspecified external cause status: Secondary | ICD-10-CM | POA: Insufficient documentation

## 2015-08-28 DIAGNOSIS — Y939 Activity, unspecified: Secondary | ICD-10-CM | POA: Insufficient documentation

## 2015-08-28 NOTE — Discharge Instructions (Signed)

## 2015-08-28 NOTE — ED Notes (Signed)
Pt. Is able to stand on the R leg during assessment.

## 2015-08-28 NOTE — ED Notes (Signed)
His right knee gave out while attempting to sit. Painful.

## 2015-08-28 NOTE — ED Provider Notes (Signed)
CSN: [id number]     Arrival date & time 08/28/15  1653 History   First MD Initiated Contact with Patient 08/28/15 1702     Chief Complaint  Patient presents with  . Knee Pain   Patient is a 55 y.o. male presenting with knee pain.  Knee Pain Location:  Knee Injury: no   Knee location:  R knee Pain details:    Quality:  Aching   Radiates to:  Does not radiate   Severity:  Mild   Onset quality:  Sudden   Timing:  Intermittent   Progression:  Improving Prior injury to area:  No Relieved by:  None tried Worsened by:  Bearing weight Ineffective treatments:  None tried Associated symptoms: no decreased ROM, no fever, no muscle weakness, no numbness, no swelling and no tingling    Danny Hopkins is a 55 year old male with PMHx of CVA and OA presenting with knee pain. Pt states he was lowering himself into a seated position when he felt a pop in his right knee and acute onset of pain. He states that his knee then gave out and he fell onto the chair. He states the pain was so severe that he was unable to push himself to a standing position for a few minutes. The pain has improved since onset. He states that the pain is anterior and medial. He describes it as sore and aching. The pain resolves when he is at rest. The pain is only present when weight bearing. He has not taken any OTC medications, ice or heat. Denies history of injury to the knee. Denies weakness, numbness, tingling in the right lower extremity. He has no other complaints today.   Past Medical History  Diagnosis Date  . Stroke ([place])   . Arthritis   . History of chicken pox   . 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 Cervical staph infection      2006  . Inguinal hernia repair      kindergarten   Family History  Problem Relation Age of Onset  . Stomach cancer Neg Hx   . Rectal cancer Neg Hx   . Pancreatic cancer Neg Hx   . Colon cancer Neg Hx     Social History  Substance Use Topics  . Smoking status: Never Smoker   . Smokeless tobacco: Never Used  . Alcohol Use: 1.2 oz/week    2 Cans of beer per week    Review of Systems  Constitutional: Negative for fever.  All other systems reviewed and are negative.  Allergies  Review of patient's allergies indicates no known allergies.  Home Medications   Prior to Admission medications   Medication Sig Start Date End Date Taking? Authorizing Provider  ALPRAZolam Duanne Moron) 1 MG tablet TAKE 1 TABLET BY MOUTH TWICE A DAY AS NEEDED 08/11/15   Biagio Borg, MD  atorvastatin (LIPITOR) 20 MG tablet TAKE 1 TABLET (20 MG TOTAL) BY MOUTH DAILY. 08/25/15   Biagio Borg, MD  baclofen (LIORESAL) 10 MG tablet Take 1 tablet (10 mg total) by mouth 3 (three) times daily as needed for muscle spasms. 06/24/15   Biagio Borg, MD  clotrimazole-betamethasone (LOTRISONE) cream Apply topically 2 (two) times daily. 04/20/15   Biagio Borg, MD  doxycycline (VIBRA-TABS) 100 MG tablet Take 1 tablet (100 mg total) by mouth 2 (two) times daily. 07/29/15   Biagio Borg, MD  HYDROcodone-acetaminophen (NORCO) 10-325 MG per tablet Take 1 tablet by mouth every 8 (eight) hours as needed.  09/24/12   Historical Provider, MD  ketoconazole (NIZORAL) 2 % shampoo Apply 1 application topically 2 (two) times a week. 06/24/15   Biagio Borg, MD  sildenafil (VIAGRA) 100 MG tablet Take 0.5-1 tablets (50-100 mg total) by mouth daily as needed for erectile dysfunction. 07/24/13   Biagio Borg, MD  tamsulosin (FLOMAX) 0.4 MG CAPS capsule Take 1 capsule (0.4 mg total) by mouth daily. 12/22/14   Biagio Borg, MD   BP 122/75 mmHg  Pulse 68  Temp(Src) 98.7 F (37.1 C) (Oral)  Resp 18  Ht 5\' 7"  (1.702 m)  Wt 65.318 kg  BMI 22.55 kg/m2  SpO2 98% Physical Exam  Constitutional: He appears well-developed and well-nourished. No distress.  HENT:  Head: Normocephalic and atraumatic.  Right Ear: External ear normal.  Left Ear: External ear  normal.  Eyes: Conjunctivae are normal. Right eye exhibits no discharge. Left eye exhibits no discharge. No scleral icterus.  Neck: Normal range of motion.  Cardiovascular: Normal rate and intact distal pulses.   Pedal pulse palpable  Pulmonary/Chest: Effort normal.  Musculoskeletal: Normal range of motion.       Right knee: He exhibits normal range of motion, no swelling, no effusion, no deformity, no LCL laxity and no MCL laxity. Tenderness found. Medial joint line tenderness noted.       Legs: Mild TTP of right anterior knee at medial joint line. FROM intact without pain. No effusion or deformity. No ligamentous laxity. Pt is able to ambulate though he favors the right knee.   Neurological: He is alert. Coordination normal.  5/5 strength of the BLE. Sensation to light touch intact throughout.   Skin: Skin is warm and dry.  Psychiatric: He has a normal mood and affect. His behavior is normal.  Nursing note and vitals reviewed.   ED Course  Procedures (including critical care time) Labs Review Labs Reviewed - No data to display  Imaging Review Dg Knee Complete 4 Views Right  08/28/2015  CLINICAL DATA:  Pain following fall EXAM: RIGHT KNEE - COMPLETE 4+ VIEW COMPARISON:  None. FINDINGS: Frontal, lateral, and bilateral oblique views were obtained. There is no fracture or dislocation. The joint spaces appear normal. No appreciable joint effusion. No erosive change. There are scattered foci of arterial vascular calcification. IMPRESSION: No fracture or joint effusion. No appreciable arthropathy. Mild atherosclerotic calcification noted. Electronically Signed   By: Lowella Grip III M.D.   On: 08/28/2015 18:14   I have personally reviewed and evaluated these images and lab results as part of my medical decision-making.   EKG Interpretation None      MDM   Final diagnoses:  Right knee pain   Patient presenting with right knee pain after sitting. Right lower leg is neurovascularly  intact with FROM. Mild TTP at medial joint line without effusion or ligamentous laxity. Patient X-Ray negative for obvious fracture or dislocation. Offered pt motrin for pain but pt declined stating he has oxycodone at home for chronic back pain. Given ice for knee in ED. Pt is able to ambulate with a steady gait. Conservative therapy recommended. Discussed RICE therapy. Pt advised to follow up with orthopedics if symptoms persist. Return precautions discussed at bedside and given in discharge paperwork. Pt is stable for discharge.     Lahoma Crocker Eion Timbrook, PA-C 08/28/15 1826  Merrily Pew, MD 08/28/15 2350

## 2015-09-16 ENCOUNTER — Telehealth: Payer: Self-pay

## 2015-09-16 DIAGNOSIS — M4712 Other spondylosis with myelopathy, cervical region: Secondary | ICD-10-CM | POA: Diagnosis not present

## 2015-09-16 DIAGNOSIS — R238 Other skin changes: Secondary | ICD-10-CM

## 2015-09-16 MED ORDER — DOXYCYCLINE HYCLATE 100 MG PO TABS: 100.0000 mg | ORAL_TABLET | Freq: Two times a day (BID) | ORAL | Status: DC

## 2015-09-16 NOTE — Telephone Encounter (Signed)
[place] for doxy course refill, and refer dermatology

## 2015-09-16 NOTE — Telephone Encounter (Signed)
Please advise 

## 2015-09-16 NOTE — Telephone Encounter (Signed)
Patient calls and states that he has the bumps on his head again. He wanted to know if he could get an abx again for it. It really helped last time. Also he would like to go on and get a referral to someone about this issues. Please follow up.

## 2015-09-17 ENCOUNTER — Other Ambulatory Visit: Payer: Self-pay | Admitting: Internal Medicine

## 2015-09-17 NOTE — Telephone Encounter (Signed)
Patient aware.

## 2015-10-01 ENCOUNTER — Encounter: Payer: Self-pay | Admitting: Internal Medicine

## 2015-10-02 ENCOUNTER — Encounter: Payer: Self-pay | Admitting: Physical Therapy

## 2015-10-02 ENCOUNTER — Ambulatory Visit: Payer: Medicare Other | Attending: Neurosurgery | Admitting: Physical Therapy

## 2015-10-02 DIAGNOSIS — R262 Difficulty in walking, not elsewhere classified: Secondary | ICD-10-CM | POA: Diagnosis not present

## 2015-10-02 DIAGNOSIS — M545 Low back pain, unspecified: Secondary | ICD-10-CM

## 2015-10-02 DIAGNOSIS — M6281 Muscle weakness (generalized): Secondary | ICD-10-CM | POA: Diagnosis not present

## 2015-10-02 DIAGNOSIS — R296 Repeated falls: Secondary | ICD-10-CM

## 2015-10-02 NOTE — Therapy (Signed)
[facility] [place], [place], [address] Phone: [phone]   Fax:  [phone]  Physical Therapy Evaluation  Patient Details  Name: Danny Hopkins MRN: [id number] Date of Birth: 07-08-1960 Referring Provider: Mallie Mussel A. Pool, MD  Encounter Date: 10/02/2015      PT End of Session - 10/02/15 1154    Visit Number 1   Number of Visits 13   Date for PT Re-Evaluation 11/13/15   Authorization Type Medicare- KX at visit 15   PT Start Time 0800   PT Stop Time 0847   PT Time Calculation (min) 47 min   Activity Tolerance Patient tolerated treatment well   Behavior During Therapy [facility] for tasks assessed/performed      Past Medical History  Diagnosis Date  . Stroke ([place])   . Arthritis   . History of chicken pox   . 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 Cervical staph infection      2006  . Inguinal hernia repair      kindergarten    There were no vitals filed for this visit.       Subjective Assessment - 10/02/15 0805    Subjective Pt denies any pain in his neck, reports pain is really in lower back. Pt reports he is very stiff when he first gets up, feels much better after moving around- has been like this since neck surgery. Pt reports spasm around trunk every night that lasts a couple of seconds and goes away. Pt reports soreness and pain in L shoulder and arm but does not use it much, feels very weak in bilateral LE. [place] HA symptoms. Was feeling good after surgery until last summer began falling frequently and feeling that "feet are slow". History of stroke that reportedly occured while he was in the hospital for surgery- affecting L arm and leg.    How long can you sit comfortably? unlimited   How long can you stand comfortably? 30 min approx   How long can you walk comfortably? 20 min and R foot begins to drag- unable to walk in  grass   Patient Stated Goals squat down and return to standing   Currently in Pain? Yes   Pain Score 5    Pain Location Back   Pain Descriptors / Indicators --  stiff   Pain Type Chronic pain   Aggravating Factors  sitting still and then getting up to move   Pain Relieving Factors moving around, alternating sit/stand, pain medictions            [facility] PT Assessment - 10/02/15 0001    Assessment   Medical Diagnosis cervical spondylosis with myelopathy   Referring Provider Mallie Mussel A. Pool, MD   Onset Date/Surgical Date 03/28/04   Hand Dominance Right   Next MD Visit Dec 2017   Prior Therapy not in the last year   Precautions   Precautions Fall   Restrictions   Weight Bearing Restrictions No   Balance Screen   Has the patient fallen in the past 6 months Yes   How many times? 2-3   [place] residence   Living Arrangements Alone   Type of [place] Access Level entry   Prior Function   Level of Independence Independent   Cognition   Overall Cognitive Status Within Functional Limits for tasks assessed  Observation/Other Assessments   Focus on Therapeutic Outcomes (FOTO)  41% limited   Other Surveys  Other Surveys  BERG 37/56   Posture/Postural Control   Posture Comments resting at 12 deg flx   ROM / Strength   AROM / PROM / Strength AROM;Strength   AROM   AROM Assessment Site Cervical;Lumbar   Cervical Flexion 30   Cervical Extension 30   Cervical - Right Side Bend 20   Cervical - Left Side Bend 20   Lumbar Flexion 40  after 12 deg flx resting   Lumbar Extension 10  from 12 deg flx resting   Strength   Strength Assessment Site Shoulder;Hip;Knee   Right/Left Shoulder Right;Left   Left Shoulder Internal Rotation 4-/5   Left Shoulder Horizontal ADduction 4-/5   Right/Left Hip Right;Left   Right Hip External Rotation  4/5   Right Hip Internal Rotation 4+/5   Left Hip Flexion 4/5   Left Hip External Rotation 3+/5    Left Hip Internal Rotation 4+/5   Right/Left Knee Right;Left   Right Knee Flexion 4/5   Left Knee Flexion 4/5   Ambulation/Gait   Gait Comments scissoring gait, flat foot, lacking knee extension at heel strike or flexion in swing through, L hip add in stance phase, L hip hike and circumduction; no notable trunk rotation or arm swing; poor balance                   [facility] Adult PT Treatment/Exercise - 10/02/15 0001    Exercises   Exercises Lumbar;Knee/Hip   Lumbar Exercises: Stretches   Single Knee to Chest Stretch Limitations 5x20s ea   Lower Trunk Rotation Limitations x10 ea   Lumbar Exercises: Supine   Bridge Limitations pre-bridge, req heavy VC                PT Education - 10/02/15 1154    Education provided Yes   Education Details anatomy of condition, POC, HEP   Person(s) Educated Patient   Methods Explanation;Demonstration;Tactile cues;Verbal cues   Comprehension Verbalized understanding;Returned demonstration;Verbal cues required;Tactile cues required;Need further instruction          PT Short Term Goals - 10/02/15 1202    PT SHORT TERM GOAL #1   Title verbalize notable improvement in stiffness upon getting up by using exercises by 7/28   Time 3   Period Weeks   Status New   PT SHORT TERM GOAL #2   Title able to ambulate properly and safely with assistive device   Time 3   Period Weeks   Status New           PT Long Term Goals - 10/02/15 1202    PT LONG TERM GOAL #1   Title Progress to Low fall risk on BERG by 8/18   Time 6   Period Weeks   Status New   PT LONG TERM GOAL #2   Title able to squat down to lift box with 5lb and return to standing safely   Time 6   Period Weeks   Status New   PT LONG TERM GOAL #3   Title FOTO to 64% ability to indicate significant functional improvement   Time 6   Period Weeks   Status New   PT LONG TERM GOAL #4   Title demo gait pattern without scissoring for 25ft   Time 6   Period Weeks    Status New   PT LONG TERM GOAL #5   Title pain <  3/10 upon getting out of bed in the morning   Time 6   Period Weeks   Status New               Plan - 10/02/15 1155    Clinical Impression Statement Pt presents to PT with complaints of weakness in extremities, poor balance and pain in low back that began last summer. Pt had cervical fusion and TIA in 07-10-2004. Pt scored Mod fall risk on BERG and demo severe instability in gait, was instructed to use AD in community for safety. Pt has good strength in upper extremities in straight plane MMT but verbalizes fatigue. Pt will benefit from skilled PT in order to decrease stiffness in spine as well as improve balance while conditioning for improved safety and endurance.    Rehab Potential Fair   PT Frequency 2x / week   PT Duration 6 weeks   PT Treatment/Interventions ADLs/Self Care Home Management;Cryotherapy;Electrical Stimulation;Ultrasound;Iontophoresis 4mg /ml Dexamethasone;Moist Heat;Gait training;Stair training;Functional mobility training;Therapeutic activities;Therapeutic exercise;Balance training;Patient/family education;Neuromuscular re-education;Manual techniques;Taping;Dry needling;Passive range of motion   PT Next Visit Plan hip and core strengthening, thoracic and lumbar spinal mobilizations, CKC strength with balance challenges   PT Home Exercise Plan LTR, SKTC, heel press (pre bridge), gait awareness to avoid scissoring   Consulted and Agree with Plan of Care Patient      Patient will benefit from skilled therapeutic intervention in order to improve the following deficits and impairments:  Abnormal gait, Improper body mechanics, Pain, Postural dysfunction, Increased muscle spasms, Decreased mobility, Decreased strength, Difficulty walking, Decreased balance  Visit Diagnosis: Bilateral low back pain without sciatica - Plan: PT plan of care cert/re-cert  Repeated falls - Plan: PT plan of care cert/re-cert  Muscle weakness  (generalized) - Plan: PT plan of care cert/re-cert  Difficulty in walking, not elsewhere classified - Plan: PT plan of care cert/re-cert      G-Codes - 123XX123 1205/07/10    Functional Limitation Mobility: Walking and moving around   Mobility: Walking and Moving Around Current Status [phone]) At least 40 percent but less than 60 percent impaired, limited or restricted   Mobility: Walking and Moving Around Goal Status [phone]) At least 20 percent but less than 40 percent impaired, limited or restricted       Problem List Patient Active Problem List   Diagnosis Date Noted  . Scalp pain 07/30/2015  . Muscle spasticity 06/24/2015  . Dandruff 06/24/2015  . Lymphadenitis 06/24/2015  . Burning with urination 01/25/2015  . Balanitis 01/25/2015  . Sleeping difficulties 02/04/2014  . Bladder neck obstruction 02/04/2014  . Hyperlipidemia 02/04/2014  . Other malaise and fatigue 07/24/2013  . Stroke ([place]) 07/24/2013  . Routine general medical examination at a health care facility 07/24/2013  . 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 Insomnia 10/12/2012  . Generalized anxiety disorder 10/12/2012  . Eczema 10/12/2012  . History of stroke 10/12/2012   Sherrod Toothman C. Praneel Haisley PT, DPT 10/02/2015 12:11 PM   [facility] [address] [place], [place], [address] Phone: [phone]   Fax:  [phone]  Name: Ewell Defusco MRN: [id number] Date of Birth: [birth year]

## 2015-10-05 ENCOUNTER — Telehealth: Payer: Self-pay

## 2015-10-05 NOTE — Telephone Encounter (Signed)
Call rec'd from Mr. Tram on VM, called him back and stated that he wanted me to know he had went to his first PT visit and how much the PT had already helped him get back on track. Corrected his gait and he states this has already made a difference in his stability.  Thanked him for taking the time to call

## 2015-10-06 ENCOUNTER — Ambulatory Visit: Payer: Medicare Other | Admitting: Physical Therapy

## 2015-10-06 ENCOUNTER — Encounter: Payer: Self-pay | Admitting: Physical Therapy

## 2015-10-06 DIAGNOSIS — R296 Repeated falls: Secondary | ICD-10-CM

## 2015-10-06 DIAGNOSIS — R262 Difficulty in walking, not elsewhere classified: Secondary | ICD-10-CM | POA: Diagnosis not present

## 2015-10-06 DIAGNOSIS — M545 Low back pain, unspecified: Secondary | ICD-10-CM

## 2015-10-06 DIAGNOSIS — M6281 Muscle weakness (generalized): Secondary | ICD-10-CM | POA: Diagnosis not present

## 2015-10-06 NOTE — Therapy (Signed)
[facility] [place], [place], [address] Phone: [phone]   Fax:  [phone]  Physical Therapy Treatment  Patient Details  Name: Danny Hopkins MRN: [id number] Date of Birth: [birth year] Referring Provider: Mallie Mussel A. Pool, MD  Encounter Date: 10/06/2015      PT End of Session - 10/06/15 0935    Visit Number 2   Number of Visits 13   Date for PT Re-Evaluation 11/13/15   Authorization Type Medicare- KX at visit 15   PT Start Time 0932   PT Stop Time 1023   PT Time Calculation (min) 51 min   Activity Tolerance Patient tolerated treatment well   Behavior During Therapy [facility] for tasks assessed/performed      Past Medical History  Diagnosis Date  . Stroke ([place])   . Arthritis   . History of chicken pox   . 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 Cervical staph infection      2006  . Inguinal hernia repair      kindergarten    There were no vitals filed for this visit.      Subjective Assessment - 10/06/15 0934    Subjective Pt reports exercises helped to decrease soreness when getting up this morning.    Currently in Pain? Yes   Pain Score 5    Pain Location Back   Pain Orientation Right;Lower   Pain Descriptors / Indicators Sore                         [facility] Adult PT Treatment/Exercise - 10/06/15 0001    Lumbar Exercises: Supine   Bridge Limitations bridge with horiz abd red tband 2 min   Knee/Hip Exercises: Standing   Lateral Step Up Limitations lateral steps over 1/2 foam; step ups to airex   Forward Step Up Limitations step over and return 1/2 foam   Functional Squat Limitations mini squats in parallel bars   SLS with Vectors slow marches on airex   Other Standing Knee Exercises eyes open/closed on airex   Knee/Hip Exercises: Supine   Straight Leg Raises 15 reps;Both   Knee/Hip Exercises: Sidelying    Clams x30   Modalities   Modalities Moist Heat   Moist Heat Therapy   Number Minutes Moist Heat 10 Minutes   Moist Heat Location Lumbar Spine   Manual Therapy   Manual Therapy Soft tissue mobilization   Soft tissue mobilization R QL and lumbar paraspinals                PT Education - 10/06/15 1155    Education provided Yes   Education Details exercise form/rationale, balance and safety   Person(s) Educated Patient   Methods Explanation;Demonstration;Tactile cues;Verbal cues   Comprehension Verbalized understanding;Returned demonstration;Verbal cues required;Tactile cues required;Need further instruction          PT Short Term Goals - 10/02/15 1202    PT SHORT TERM GOAL #1   Title verbalize notable improvement in stiffness upon getting up by using exercises by 7/28   Time 3   Period Weeks   Status New   PT SHORT TERM GOAL #2   Title able to ambulate properly and safely with assistive device   Time 3   Period Weeks   Status New           PT Long Term Goals - 10/02/15  [place] #1   Title Progress to Low fall risk on BERG by 8/18   Time 6   Period Weeks   Status New   PT LONG TERM GOAL #2   Title able to squat down to lift box with 5lb and return to standing safely   Time 6   Period Weeks   Status New   PT LONG TERM GOAL #3   Title FOTO to 64% ability to indicate significant functional improvement   Time 6   Period Weeks   Status New   PT LONG TERM GOAL #4   Title demo gait pattern without scissoring for 76ft   Time 6   Period Weeks   Status New   PT LONG TERM GOAL #5   Title pain <3/10 upon getting out of bed in the morning   Time 6   Period Weeks   Status New               Plan - 10/06/15 1157    Clinical Impression Statement Pt did well with balance but required guarding and use of parallel bars frequently. pt has difficulty with extremity control, most notably on L side which is resulting in compensatory  patterns creating LBP as well as poor balance control. Pt did demo improved stance and step width which he reported has made him feel more balanced.    PT Next Visit Plan hip and core strengthening; postural strength and endurance, CKC strength with balance challenges   PT Home Exercise Plan LTR, SKTC, heel press (pre bridge), gait awareness to avoid scissoring   Consulted and Agree with Plan of Care Patient      Patient will benefit from skilled therapeutic intervention in order to improve the following deficits and impairments:     Visit Diagnosis: Bilateral low back pain without sciatica  Repeated falls  Muscle weakness (generalized)  Difficulty in walking, not elsewhere classified     Problem List Patient Active Problem List   Diagnosis Date Noted  . Scalp pain 07/30/2015  . Muscle spasticity 06/24/2015  . Dandruff 06/24/2015  . Lymphadenitis 06/24/2015  . Burning with urination 01/25/2015  . Balanitis 01/25/2015  . Sleeping difficulties 02/04/2014  . Bladder neck obstruction 02/04/2014  . Hyperlipidemia 02/04/2014  . Other malaise and fatigue 07/24/2013  . Stroke ([place]) 07/24/2013  . Routine general medical examination at a health care facility 07/24/2013  . 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 Insomnia 10/12/2012  . Generalized anxiety disorder 10/12/2012  . Eczema 10/12/2012  . History of stroke 10/12/2012   Danny Hopkins C. Danny Hopkins PT, DPT 10/06/2015 12:01 PM   [facility] [address] [place], [place], [address] Phone: [phone]   Fax:  [phone]  Name: Danny Hopkins MRN: [id number] Date of Birth: 09-24-1960

## 2015-10-08 ENCOUNTER — Encounter: Payer: Self-pay | Admitting: Physical Therapy

## 2015-10-08 ENCOUNTER — Ambulatory Visit: Payer: Medicare Other | Admitting: Physical Therapy

## 2015-10-08 DIAGNOSIS — M545 Low back pain, unspecified: Secondary | ICD-10-CM

## 2015-10-08 DIAGNOSIS — M6281 Muscle weakness (generalized): Secondary | ICD-10-CM | POA: Diagnosis not present

## 2015-10-08 DIAGNOSIS — R296 Repeated falls: Secondary | ICD-10-CM | POA: Diagnosis not present

## 2015-10-08 DIAGNOSIS — R262 Difficulty in walking, not elsewhere classified: Secondary | ICD-10-CM

## 2015-10-08 NOTE — Therapy (Signed)
[place], [place], [address] Phone: [phone]   Fax:  [phone]  Physical Therapy Treatment  Patient Details  Name: Danny Hopkins MRN: [id number] Date of Birth: [birth year] Referring Provider: Mallie Mussel A. Pool, MD  Encounter Date: 10/08/2015      PT End of Session - 10/08/15 1150    Visit Number 3   Number of Visits 13   Date for PT Re-Evaluation 11/13/15   Authorization Type Medicare- KX at visit 15   PT Start Time 1100   PT Stop Time 1153   PT Time Calculation (min) 53 min   Activity Tolerance Patient tolerated treatment well;Patient limited by fatigue   Behavior During Therapy [facility] for tasks assessed/performed      Past Medical History  Diagnosis Date  . Stroke ([place])   . Arthritis   . History of chicken pox   . 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 Cervical staph infection      2006  . Inguinal hernia repair      kindergarten    There were no vitals filed for this visit.      Subjective Assessment - 10/08/15 1103    Subjective Pt reports his L hip is sore this morning 7/10, Uses his riding lawn mower once a week but always has pain in L hip following. Fell yesterday in grass due to L leg feeling heavy after mowing   Currently in Pain? Yes   Pain Score 5    Pain Location Back                         [facility] Adult PT Treatment/Exercise - 10/08/15 0001    Lumbar Exercises: Supine   Bridge Limitations bridge with PB x20   Knee/Hip Exercises: Aerobic   Nustep 8 min L5   Knee/Hip Exercises: Standing   Gait Training heel toe with weight shift in parallel bars   Other Standing Knee Exercises side lunges with chair support x10 each   Other Standing Knee Exercises in parallel bars: trampoline marching & jumping; hamstring curls   Knee/Hip Exercises: Seated   Sit to Sand 10 reps  red tband  around knees   Knee/Hip Exercises: Supine   Straight Leg Raises 15 reps;Both   Knee/Hip Exercises: Sidelying   Clams x30 each   Moist Heat Therapy   Number Minutes Moist Heat 10 Minutes   Moist Heat Location Lumbar Spine                PT Education - 10/08/15 1106    Education provided Yes   Education Details exercise form/rationale   Person(s) Educated Patient   Methods Explanation;Demonstration;Tactile cues;Verbal cues;Handout   Comprehension Verbalized understanding;Returned demonstration;Verbal cues required;Tactile cues required;Need further instruction          PT Short Term Goals - 10/02/15 1202    PT SHORT TERM GOAL #1   Title verbalize notable improvement in stiffness upon getting up by using exercises by 7/28   Time 3   Period Weeks   Status New   PT SHORT TERM GOAL #2   Title able to ambulate properly and safely with assistive device   Time 3   Period Weeks   Status New           PT Long Term Goals - 10/02/15 1202    PT LONG TERM  GOAL #1   Title Progress to Low fall risk on BERG by 8/18   Time 6   Period Weeks   Status New   PT LONG TERM GOAL #2   Title able to squat down to lift box with 5lb and return to standing safely   Time 6   Period Weeks   Status New   PT LONG TERM GOAL #3   Title FOTO to 64% ability to indicate significant functional improvement   Time 6   Period Weeks   Status New   PT LONG TERM GOAL #4   Title demo gait pattern without scissoring for 26ft   Time 6   Period Weeks   Status New   PT LONG TERM GOAL #5   Title pain <3/10 upon getting out of bed in the morning   Time 6   Period Weeks   Status New               Plan - 10/08/15 1115    Clinical Impression Statement Pt unable to lift L knee in clamshell position, indicating a lack of functional strength necessary for balance. Cuing required for weight shift during heel toe gait but pt was able to demonstrate wth controlled balance slowly and with SBA.      PT Next Visit Plan hip and core strengthening; postural strength and endurance, CKC strength with balance challenges   PT Home Exercise Plan LTR, SKTC, heel press (pre bridge), gait awareness to avoid scissoring   Consulted and Agree with Plan of Care Patient      Patient will benefit from skilled therapeutic intervention in order to improve the following deficits and impairments:     Visit Diagnosis: Bilateral low back pain without sciatica  Repeated falls  Muscle weakness (generalized)  Difficulty in walking, not elsewhere classified     Problem List Patient Active Problem List   Diagnosis Date Noted  . Scalp pain 07/30/2015  . Muscle spasticity 06/24/2015  . Dandruff 06/24/2015  . Lymphadenitis 06/24/2015  . Burning with urination 01/25/2015  . Balanitis 01/25/2015  . Sleeping difficulties 02/04/2014  . Bladder neck obstruction 02/04/2014  . Hyperlipidemia 02/04/2014  . Other malaise and fatigue 07/24/2013  . Stroke ([place]) 07/24/2013  . Routine general medical examination at a health care facility 07/24/2013  . 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 Insomnia 10/12/2012  . Generalized anxiety disorder 10/12/2012  . Eczema 10/12/2012  . History of stroke 10/12/2012    Nishika Parkhurst C. Lamika Connolly PT, DPT 10/08/2015 11:52 AM   [place] [facility] [address] [place], [place], [address] Phone: [phone]   Fax:  [phone]  Name: Danny Hopkins MRN: [id number] Date of Birth: 1960-07-15

## 2015-10-13 ENCOUNTER — Ambulatory Visit: Payer: Medicare Other | Admitting: Physical Therapy

## 2015-10-13 DIAGNOSIS — R262 Difficulty in walking, not elsewhere classified: Secondary | ICD-10-CM | POA: Diagnosis not present

## 2015-10-13 DIAGNOSIS — M6281 Muscle weakness (generalized): Secondary | ICD-10-CM | POA: Diagnosis not present

## 2015-10-13 DIAGNOSIS — M545 Low back pain, unspecified: Secondary | ICD-10-CM

## 2015-10-13 DIAGNOSIS — R296 Repeated falls: Secondary | ICD-10-CM

## 2015-10-13 NOTE — Therapy (Signed)
[place], [place], [address] Phone: [phone]   Fax:  [phone]  Physical Therapy Treatment  Patient Details  Name: Danny Hopkins MRN: [id number] Date of Birth: April 14, 1960 Referring Provider: Mallie Mussel A. Pool, MD  Encounter Date: 10/13/2015      PT End of Session - 10/13/15 1008    Visit Number 4   Number of Visits 13   Date for PT Re-Evaluation 11/13/15   Authorization Type Medicare- KX at visit 15   PT Start Time 1008   PT Stop Time 1108   PT Time Calculation (min) 60 min      Past Medical History  Diagnosis Date  . Stroke ([place])   . Arthritis   . History of chicken pox   . 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 Cervical staph infection      2006  . Inguinal hernia repair      kindergarten    There were no vitals filed for this visit.      Subjective Assessment - 10/13/15 1010    Currently in Pain? Yes   Pain Score 5    Pain Location Back   Pain Orientation Right;Left;Lower   Pain Descriptors / Indicators Heaviness   Aggravating Factors  prolonged sitting   Pain Relieving Factors positions changes                         [facility] Adult PT Treatment/Exercise - 10/13/15 0001    Lumbar Exercises: Stretches   Single Knee to Chest Stretch 3 reps;30 seconds   Lumbar Exercises: Supine   Heel Slides 10 reps   Heel Slides Limitations with ab draw in   Knee/Hip Exercises: Aerobic   Nustep 8 min L5   Knee/Hip Exercises: Standing   Heel Raises 10 reps   Heel Raises Limitations x 10 toe raises   SLS with Vectors 3 way hip x 10 each bilateral   Other Standing Knee Exercises side lunges with chair support x10 each   Knee/Hip Exercises: Seated   Sit to Sand 10 reps;with UE support   Knee/Hip Exercises: Supine   Bridges with Clamshell 10 reps   Bridges with Clamshell 10 reps  with green band   Straight Leg Raises 15 reps;Both   Knee/Hip Exercises: Sidelying   Clams x30 each   Knee/Hip Exercises: Prone   Hip Extension 10 reps   Hip Extension Limitations alternating x 10   Moist Heat Therapy   Number Minutes Moist Heat 15 Minutes   Moist Heat Location Lumbar Spine                  PT Short Term Goals - 10/13/15 1009    PT SHORT TERM GOAL #1   Title verbalize notable improvement in stiffness upon getting up by using exercises by 7/28   Time 3   Period Weeks   Status On-going   PT SHORT TERM GOAL #2   Title able to ambulate properly and safely with assistive device   Time 3   Period Weeks   Status On-going           PT Long Term Goals - 10/02/15 1202    PT LONG TERM GOAL #1   Title Progress to Low fall risk on BERG by 8/18   Time 6   Period Weeks   Status New  PT LONG TERM GOAL #2   Title able to squat down to lift box with 5lb and return to standing safely   Time 6   Period Weeks   Status New   PT LONG TERM GOAL #3   Title FOTO to 64% ability to indicate significant functional improvement   Time 6   Period Weeks   Status New   PT LONG TERM GOAL #4   Title demo gait pattern without scissoring for 66ft   Time 6   Period Weeks   Status New   PT LONG TERM GOAL #5   Title pain <3/10 upon getting out of bed in the morning   Time 6   Period Weeks   Status New               Plan - 10/13/15 1020    Clinical Impression Statement Pt reports a little improvement in morning stiffness. He believes he has more morning stiffness if he doesn't sleep well at night and less stifness when he takes sleep aid. He enters today with no AD and reports rarely using due to not leaving home very often. Asked him to bring it to PT.    PT Next Visit Plan hip and core strengthening; postural strength and endurance, CKC strength with balance challenges   PT Home Exercise Plan LTR, SKTC, heel press (pre bridge), gait awareness to avoid scissoring      Patient  will benefit from skilled therapeutic intervention in order to improve the following deficits and impairments:  Abnormal gait, Improper body mechanics, Pain, Postural dysfunction, Increased muscle spasms, Decreased mobility, Decreased strength, Difficulty walking, Decreased balance  Visit Diagnosis: Bilateral low back pain without sciatica  Repeated falls  Muscle weakness (generalized)  Difficulty in walking, not elsewhere classified     Problem List Patient Active Problem List   Diagnosis Date Noted  . Scalp pain 07/30/2015  . Muscle spasticity 06/24/2015  . Dandruff 06/24/2015  . Lymphadenitis 06/24/2015  . Burning with urination 01/25/2015  . Balanitis 01/25/2015  . Sleeping difficulties 02/04/2014  . Bladder neck obstruction 02/04/2014  . Hyperlipidemia 02/04/2014  . Other malaise and fatigue 07/24/2013  . Stroke ([place]) 07/24/2013  . Routine general medical examination at a health care facility 07/24/2013  . 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 Insomnia 10/12/2012  . Generalized anxiety disorder 10/12/2012  . Eczema 10/12/2012  . History of stroke 10/12/2012    Dorene Ar, PTA 10/13/2015, 11:02 AM  [facility] [address] [place], [place], [address] Phone: [phone]   Fax:  [phone]  Name: Danny Hopkins MRN: [id number] Date of Birth: 1960/03/29

## 2015-10-15 ENCOUNTER — Ambulatory Visit: Payer: Medicare Other | Admitting: Physical Therapy

## 2015-10-15 DIAGNOSIS — M545 Low back pain, unspecified: Secondary | ICD-10-CM

## 2015-10-15 DIAGNOSIS — M6281 Muscle weakness (generalized): Secondary | ICD-10-CM

## 2015-10-15 DIAGNOSIS — R296 Repeated falls: Secondary | ICD-10-CM

## 2015-10-15 DIAGNOSIS — R262 Difficulty in walking, not elsewhere classified: Secondary | ICD-10-CM

## 2015-10-15 NOTE — Therapy (Signed)
Danny Hopkins, [place], [address] Phone: [phone]   Fax:  [phone]  Physical Therapy Treatment  Patient Details  Name: Danny Hopkins MRN: [id number] Date of Birth: 10-23-1960 Referring Provider: Mallie Mussel A. Pool, MD  Encounter Date: 10/15/2015      PT End of Session - 10/15/15 1010    Visit Number 5   Number of Visits 13   Date for PT Re-Evaluation 11/13/15   Authorization Type Medicare- KX at visit 15   PT Start Time 1008   PT Stop Time 1108   PT Time Calculation (min) 60 min      Past Medical History  Diagnosis Date  . Stroke ([place])   . Arthritis   . History of chicken pox   . 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 Cervical staph infection      2006  . Inguinal hernia repair      kindergarten    There were no vitals filed for this visit.      Subjective Assessment - 10/15/15 1011    Subjective I didnt sleep well so my hips are hurting this morning. I have been using the cane more.    Currently in Pain? Yes   Pain Score 7    Pain Location Back   Pain Orientation Right;Left;Lower                         [facility] Adult PT Treatment/Exercise - 10/15/15 0001    Ambulation/Gait   Gait Comments gait training in parallel bars focusing on TKE on left, heel strikes bilateral and increased step length with and without UE. Decreased scissoring. Pt reports decreased effort when walking with less deviations.    Knee/Hip Exercises: Aerobic   Nustep 8 min L5   Knee/Hip Exercises: Standing   Heel Raises 20 reps   SLS with Vectors 3 way hip x 10 each bilateral  inparallel bars focusing on decreased UE assist with L SLS   Other Standing Knee Exercises side lunges with parallel bar support x10 each   Other Standing Knee Exercises in parallel bars arch lifts single and bilateral, SLS with 1 UE support trials   Moist Heat Therapy   Number Minutes Moist Heat 15 Minutes   Moist Heat Location Lumbar Spine                  PT Short Term Goals - 10/15/15 1013    PT SHORT TERM GOAL #1   Title verbalize notable improvement in stiffness upon getting up by using exercises by 7/28   Time 3   Period Weeks   Status On-going   PT SHORT TERM GOAL #2   Title able to ambulate properly and safely with assistive device   Time 3   Period Weeks   Status Achieved           PT Long Term Goals - 10/02/15 1202    PT LONG TERM GOAL #1   Title Progress to Low fall risk on BERG by 8/18   Time 6   Period Weeks   Status New   PT LONG TERM GOAL #2   Title able to squat down to lift box with 5lb and return to standing safely   Time 6   Period Weeks   Status New   PT LONG TERM GOAL #3  Title FOTO to 64% ability to indicate significant functional improvement   Time 6   Period Weeks   Status New   PT LONG TERM GOAL #4   Title demo gait pattern without scissoring for 4f   Time 6   Period Weeks   Status New   PT LONG TERM GOAL #5   Title pain <3/10 upon getting out of bed in the morning   Time 6   Period Weeks   Status New               Plan - 10/15/15 1011    Clinical Impression Statement pt presents to PT with his SPC. He demonstrates improved gait mechanics while using his SPC. He reports he has beenusing it when he first gets up and it has helped with his pain. Educated on how gait deviations are related to pain and encouraged him to use his SPC more often. STG#2 Met. Spent most of treatment in parallel bars focusing on gait and balance. He has poor knee control on left. Difficulty with weight bearing exercises on left. Pt became hot and required rest break. Vitals BP 112/76 and HR 53 after minutes of rest. HR up to 76 with therex. BP 96/60 when taken after therex. Pt reports MD is aware of hot flashes.    PT Next Visit Plan hip and core strengthening; postural strength and  endurance, CKC strength with balance challenges      Patient will benefit from skilled therapeutic intervention in order to improve the following deficits and impairments:  Abnormal gait, Improper body mechanics, Pain, Postural dysfunction, Increased muscle spasms, Decreased mobility, Decreased strength, Difficulty walking, Decreased balance  Visit Diagnosis: Bilateral low back pain without sciatica  Repeated falls  Muscle weakness (generalized)  Difficulty in walking, not elsewhere classified     Problem List Patient Active Problem List   Diagnosis Date Noted  . Scalp pain 07/30/2015  . Muscle spasticity 06/24/2015  . Dandruff 06/24/2015  . Lymphadenitis 06/24/2015  . Burning with urination 01/25/2015  . Balanitis 01/25/2015  . Sleeping difficulties 02/04/2014  . Bladder neck obstruction 02/04/2014  . Hyperlipidemia 02/04/2014  . Other malaise and fatigue 07/24/2013  . [place] 07/24/2013  . Routine general medical examination at a health care facility 07/24/2013  . 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 Insomnia 10/12/2012  . Generalized anxiety disorder 10/12/2012  . Eczema 10/12/2012  . History of stroke 10/12/2012    DDorene Ar PTA 10/15/2015, 11:29 AM  [facility] [address]Phone: [phone]  Fax:  [phone] Name: Danny DouseMRN: [id number] of Birth: 212-14-1962

## 2015-10-20 ENCOUNTER — Ambulatory Visit: Payer: Medicare Other | Admitting: Physical Therapy

## 2015-10-20 DIAGNOSIS — R262 Difficulty in walking, not elsewhere classified: Secondary | ICD-10-CM

## 2015-10-20 DIAGNOSIS — M545 Low back pain, unspecified: Secondary | ICD-10-CM

## 2015-10-20 DIAGNOSIS — M6281 Muscle weakness (generalized): Secondary | ICD-10-CM

## 2015-10-20 DIAGNOSIS — R296 Repeated falls: Secondary | ICD-10-CM

## 2015-10-20 NOTE — Therapy (Signed)
[place], [place], [address] Phone: [phone]   Fax:  [phone]  Physical Therapy Treatment  Patient Details  Name: Danny Hopkins MRN: [id number] Date of Birth: 01-25-1961 Referring Provider: Mallie Mussel A. Pool, MD  Encounter Date: 10/20/2015      PT End of Session - 10/20/15 0937    Visit Number 6   Number of Visits 13   Date for PT Re-Evaluation 11/13/15   Authorization Type Medicare- KX at visit 15   PT Start Time 0930   PT Stop Time 1030   PT Time Calculation (min) 60 min      Past Medical History:  Diagnosis Date  . Arthritis   . History of chicken pox   . 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 Stroke [facility])     Past Surgical History:  Procedure Laterality Date  . cervical staph infection     2006  . INGUINAL HERNIA REPAIR     kindergarten    There were no vitals filed for this visit.      Subjective Assessment - 10/20/15 0937    Pain Score 5    Pain Location Back   Pain Orientation Left   Aggravating Factors  not sleeping well   Pain Relieving Factors good night sleep                         [facility] Adult PT Treatment/Exercise - 10/20/15 0001      Ambulation/Gait   Gait Comments gait training in parallel bars focusing on TKE on left, heel strikes bilateral and increased step length with and without UE. Decreased scissoring. Pt reports decreased effort when walking with less deviations.      High Level Balance   High Level Balance Activities Tandem walking  retro tandem walking, decreasing UE support   High Level Balance Comments wide, narrow and staggered standing on foam pad adding head turns, nods and truck rotations- very difficult in staggered stand      Knee/Hip Exercises: Aerobic   Nustep 8 min L6     Knee/Hip Exercises: Standing   Heel Raises 20 reps   Gait Training heel toe with weight shift in parallel bars   Other  Standing Knee Exercises side lunges with parallel bar support x10 each     Knee/Hip Exercises: Supine   Bridges with Clamshell 10 reps   Bridges with Clamshell 10 reps  with green band   Straight Leg Raises 15 reps;Both     Knee/Hip Exercises: Sidelying   Clams x 15 with yellow band     Moist Heat Therapy   Number Minutes Moist Heat 15 Minutes   Moist Heat Location Lumbar Spine                  PT Short Term Goals - 10/20/15 0938      PT SHORT TERM GOAL #1   Title verbalize notable improvement in stiffness upon getting up by using exercises by 7/28   Time 3   Period Weeks   Status Achieved     PT SHORT TERM GOAL #2   Title able to ambulate properly and safely with assistive device   Status Achieved           PT Long Term Goals - 10/02/15 1202      PT LONG TERM GOAL #1   Title Progress to Low fall risk on BERG by 8/18  Time 6   Period Weeks   Status New     PT LONG TERM GOAL #2   Title able to squat down to lift box with 5lb and return to standing safely   Time 6   Period Weeks   Status New     PT LONG TERM GOAL #3   Title FOTO to 64% ability to indicate significant functional improvement   Time 6   Period Weeks   Status New     PT LONG TERM GOAL #4   Title demo gait pattern without scissoring for 48f   Time 6   Period Weeks   Status New     PT LONG TERM GOAL #5   Title pain <3/10 upon getting out of bed in the morning   Time 6   Period Weeks   Status New               Plan - 10/20/15 03254   Clinical Impression Statement Pt reports improvement in morning stiffness if he uses exercise prior to getting out of bed. STG#1 MET. Upone entering clinic he demonstrates increased gait deviations using SPC. After gait raining in parallel bars pt demonstrates improved gait deviations including increased heel strike and toe off, equal steps and decreased scissoring. Encouraged pt to focus on his gait.    PT Next Visit Plan hip and core  strengthening; postural strength and endurance, CKC strength with balance challenges      Patient will benefit from skilled therapeutic intervention in order to improve the following deficits and impairments:     Visit Diagnosis: No diagnosis found.     Problem List Patient Active Problem List   Diagnosis Date Noted  . Scalp pain 07/30/2015  . Muscle spasticity 06/24/2015  . Dandruff 06/24/2015  . Lymphadenitis 06/24/2015  . Burning with urination 01/25/2015  . Balanitis 01/25/2015  . Sleeping difficulties 02/04/2014  . Bladder neck obstruction 02/04/2014  . Hyperlipidemia 02/04/2014  . Other malaise and fatigue 07/24/2013  . [place] ([place] 07/24/2013  . Routine general medical examination at a health care facility 07/24/2013  . 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 Insomnia 10/12/2012  . Generalized anxiety disorder 10/12/2012  . Eczema 10/12/2012  . History of stroke 10/12/2012    DDorene Ar PTA 10/20/2015, 12:30 PM  [facility] [address]Phone: [phone]  Fax:  [phone] Name: MDevynn ScheffMRN: [id number] of Birth: [birth year]

## 2015-10-22 ENCOUNTER — Encounter: Payer: Self-pay | Admitting: Physical Therapy

## 2015-10-22 ENCOUNTER — Ambulatory Visit: Payer: Medicare Other | Admitting: Physical Therapy

## 2015-10-22 DIAGNOSIS — M545 Low back pain, unspecified: Secondary | ICD-10-CM

## 2015-10-22 DIAGNOSIS — R262 Difficulty in walking, not elsewhere classified: Secondary | ICD-10-CM | POA: Diagnosis not present

## 2015-10-22 DIAGNOSIS — M6281 Muscle weakness (generalized): Secondary | ICD-10-CM

## 2015-10-22 DIAGNOSIS — R296 Repeated falls: Secondary | ICD-10-CM | POA: Diagnosis not present

## 2015-10-22 NOTE — Therapy (Signed)
[place], [place], [address] Phone: [phone]   Fax:  [phone]  Physical Therapy Treatment  Patient Details  Name: Danny Hopkins MRN: [id number] Date of Birth: Apr 18, 1960 Referring Provider: Mallie Mussel A. Pool, MD  Encounter Date: 10/22/2015      PT End of Session - 10/22/15 0928    Visit Number 7   Number of Visits 13   Date for PT Re-Evaluation 11/13/15   Authorization Type Medicare- KX at visit 15   PT Start Time 0930   PT Stop Time 1028   PT Time Calculation (min) 58 min   Activity Tolerance Patient tolerated treatment well   Behavior During Therapy WFL for tasks assessed/performed      Past Medical History:  Diagnosis Date  . Arthritis   . History of chicken pox   . 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 Stroke [facility])     Past Surgical History:  Procedure Laterality Date  . cervical staph infection     2006  . INGUINAL HERNIA REPAIR     kindergarten    There were no vitals filed for this visit.      Subjective Assessment - 10/22/15 0933    Subjective pt reports gait is feeling better but has some stiffness on L side of low back.    Currently in Pain? Yes   Pain Score 6    Pain Location Back   Pain Orientation Left   Aggravating Factors  first waking up and sitting still for too long.                          [facility] Adult PT Treatment/Exercise - 10/22/15 0001      Lumbar Exercises: Stretches   Double Knee to Chest Stretch Limitations 2 min bilat LE over physioball     Knee/Hip Exercises: Aerobic   Nustep 10 min L6     Knee/Hip Exercises: Standing   Heel Raises 20 reps;10 reps   SLS 1 min ea pysioball on table for UE support   Gait Training fwd/retro monster walks YTB   Other Standing Knee Exercises wide tandem ball bouce 2 min ea     Knee/Hip Exercises: Seated   Sit to Sand 10 reps  yellow tband around knees     Knee/Hip  Exercises: Supine   Bridges with Clamshell 20 reps  with green tband     Knee/Hip Exercises: Sidelying   Clams x30 each     Moist Heat Therapy   Number Minutes Moist Heat 15 Minutes   Moist Heat Location Lumbar Spine                PT Education - 10/22/15 0936    Education provided Yes   Education Details exercise form/rationale, getting up during commercials while watching TV   Person(s) Educated Patient   Methods Explanation;Demonstration;Tactile cues;Verbal cues   Comprehension Verbalized understanding;Returned demonstration;Verbal cues required;Tactile cues required;Need further instruction          PT Short Term Goals - 10/20/15 0938      PT SHORT TERM GOAL #1   Title verbalize notable improvement in stiffness upon getting up by using exercises by 7/28   Time 3   Period Weeks   Status Achieved     PT SHORT TERM GOAL #2   Title able to ambulate properly and safely with assistive device   Status Achieved  PT Long Term Goals - 10/02/15 1202      PT LONG TERM GOAL #1   Title Progress to Low fall risk on BERG by 8/18   Time 6   Period Weeks   Status New     PT LONG TERM GOAL #2   Title able to squat down to lift box with 5lb and return to standing safely   Time 6   Period Weeks   Status New     PT LONG TERM GOAL #3   Title FOTO to 64% ability to indicate significant functional improvement   Time 6   Period Weeks   Status New     PT LONG TERM GOAL #4   Title demo gait pattern without scissoring for 47ft   Time 6   Period Weeks   Status New     PT LONG TERM GOAL #5   Title pain <3/10 upon getting out of bed in the morning   Time 6   Period Weeks   Status New               Plan - 10/22/15 1014    Clinical Impression Statement significant improvement in ability to control balance in tandem and SLS. Cont to have difficulty avoiding knee valgus in gait and sit to stand due to fatigue and difficulty, able to correct with  cuing.    PT Next Visit Plan FOTO. hip and core strengthening; postural strength and endurance, CKC strength with balance challenges   Consulted and Agree with Plan of Care Patient      Patient will benefit from skilled therapeutic intervention in order to improve the following deficits and impairments:     Visit Diagnosis: Bilateral low back pain without sciatica  Repeated falls  Muscle weakness (generalized)  Difficulty in walking, not elsewhere classified     Problem List Patient Active Problem List   Diagnosis Date Noted  . Scalp pain 07/30/2015  . Muscle spasticity 06/24/2015  . Dandruff 06/24/2015  . Lymphadenitis 06/24/2015  . Burning with urination 01/25/2015  . Balanitis 01/25/2015  . Sleeping difficulties 02/04/2014  . Bladder neck obstruction 02/04/2014  . Hyperlipidemia 02/04/2014  . Other malaise and fatigue 07/24/2013  . Stroke ([place]) 07/24/2013  . Routine general medical examination at a health care facility 07/24/2013  . 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 Insomnia 10/12/2012  . Generalized anxiety disorder 10/12/2012  . Eczema 10/12/2012  . History of stroke 10/12/2012    Sahan Pen C. Astella Desir PT, DPT 10/22/15 11:01 AM   [facility] [address] [place], [place], [address] Phone: [phone]   Fax:  [phone]  Name: Danny Hopkins MRN: [id number] Date of Birth: [birth year]

## 2015-10-27 ENCOUNTER — Ambulatory Visit: Payer: Medicare Other | Attending: Neurosurgery | Admitting: Physical Therapy

## 2015-10-27 DIAGNOSIS — R296 Repeated falls: Secondary | ICD-10-CM | POA: Insufficient documentation

## 2015-10-27 DIAGNOSIS — M545 Low back pain, unspecified: Secondary | ICD-10-CM

## 2015-10-27 DIAGNOSIS — M6281 Muscle weakness (generalized): Secondary | ICD-10-CM | POA: Insufficient documentation

## 2015-10-27 DIAGNOSIS — R262 Difficulty in walking, not elsewhere classified: Secondary | ICD-10-CM | POA: Diagnosis not present

## 2015-10-27 NOTE — Therapy (Signed)
[place], [place], [address] Phone: [phone]   Fax:  [phone]  Physical Therapy Treatment  Patient Details  Name: Danny Hopkins Date of Birth: 08-31-1960 Referring Provider: Mallie Mussel A. Pool, MD  Encounter Date: 10/27/2015      PT End of Session - 10/27/15 0937    Visit Number 8   Number of Visits 13   Date for PT Re-Evaluation 11/13/15   Authorization Type Medicare- KX at visit 15   PT Start Time 0930   PT Stop Time 1030   PT Time Calculation (min) 60 min      Past Medical History:  Diagnosis Date  . Arthritis   . History of chicken pox   . 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 Stroke [facility])     Past Surgical History:  Procedure Laterality Date  . cervical staph infection     2006  . INGUINAL HERNIA REPAIR     kindergarten    There were no vitals filed for this visit.      Subjective Assessment - 10/27/15 0938    Subjective Not a good day, I am tired   Currently in Pain? Yes   Pain Score 5    Pain Location Back   Pain Orientation Left   Pain Descriptors / Indicators Sharp   Aggravating Factors  move certain ways   Pain Relieving Factors good night sleep                         [facility] Adult PT Treatment/Exercise - 10/27/15 0001      Lumbar Exercises: Seated   Sit to Stand 10 reps     Knee/Hip Exercises: Aerobic   Nustep 8 min L6     Knee/Hip Exercises: Standing   Heel Raises 20 reps;10 reps   Gait Training fwd/retro monster walks GTB, lateral squats green band )all in parallel bars)    Other Standing Knee Exercises tandem balance and tandem walking     Knee/Hip Exercises: Supine   Other Supine Knee/Hip Exercises clam with green band x 20     Knee/Hip Exercises: Sidelying   Clams x30 each     Moist Heat Therapy   Number Minutes Moist Heat 15 Minutes   Moist Heat Location Lumbar Spine                  PT Short Term Goals - 10/20/15 0938      PT SHORT TERM GOAL #1   Title verbalize notable improvement in stiffness upon getting up by using exercises by 7/28   Time 3   Period Weeks   Status Achieved     PT SHORT TERM GOAL #2   Title able to ambulate properly and safely with assistive device   Status Achieved           PT Long Term Goals - 10/27/15 1209      PT LONG TERM GOAL #1   Title Progress to Low fall risk on BERG by 8/18   Time 6   Period Weeks   Status Unable to assess     PT LONG TERM GOAL #2   Title able to squat down to lift box with 5lb and return to standing safely   Time 6   Period Weeks   Status Unable to assess     PT LONG TERM GOAL #3   Title FOTO to  64% ability to indicate significant functional improvement   Baseline Incorrect body part   Time 6   Period Weeks   Status Deferred     PT LONG TERM GOAL #4   Title demo gait pattern without scissoring for 71f   Time 6   Period Weeks   Status Partially Met     PT LONG TERM GOAL #5   Title pain <3/10 upon getting out of bed in the morning   Baseline 5/10   Time 6   Period Weeks   Status On-going               Plan - 10/27/15 1010    Clinical Impression Statement Pt demonstrates improved gait mechanics without cues. He is able to tolerate green band closed chain lateral hip strengthening. LTG# 4 partially Met.    PT Next Visit Plan CHECK goals/ BERG      Patient will benefit from skilled therapeutic intervention in order to improve the following deficits and impairments:  Abnormal gait, Improper body mechanics, Pain, Postural dysfunction, Increased muscle spasms, Decreased mobility, Decreased strength, Difficulty walking, Decreased balance  Visit Diagnosis: Bilateral low back pain without sciatica  Repeated falls  Muscle weakness (generalized)  Difficulty in walking, not elsewhere classified     Problem List Patient Active Problem List   Diagnosis Date Noted  . Scalp  pain 07/30/2015  . Muscle spasticity 06/24/2015  . Dandruff 06/24/2015  . Lymphadenitis 06/24/2015  . Burning with urination 01/25/2015  . Balanitis 01/25/2015  . Sleeping difficulties 02/04/2014  . Bladder neck obstruction 02/04/2014  . Hyperlipidemia 02/04/2014  . Other malaise and fatigue 07/24/2013  . [place]  . Routine general medical examination at a health care facility 07/24/2013  . 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 Insomnia 10/12/2012  . Generalized anxiety disorder 10/12/2012  . Eczema 10/12/2012  . History of stroke 10/12/2012    DDorene Ar PTA 10/27/2015, 12:16 PM  [facility] [address]Phone: [phone]  Fax:  [phone] Name: Danny HollabaughMRN: [id number] of Birth: [birth year]

## 2015-10-29 ENCOUNTER — Ambulatory Visit: Payer: Medicare Other | Admitting: Physical Therapy

## 2015-10-29 DIAGNOSIS — M6281 Muscle weakness (generalized): Secondary | ICD-10-CM | POA: Diagnosis not present

## 2015-10-29 DIAGNOSIS — R262 Difficulty in walking, not elsewhere classified: Secondary | ICD-10-CM | POA: Diagnosis not present

## 2015-10-29 DIAGNOSIS — R296 Repeated falls: Secondary | ICD-10-CM | POA: Diagnosis not present

## 2015-10-29 DIAGNOSIS — M545 Low back pain, unspecified: Secondary | ICD-10-CM

## 2015-10-29 NOTE — Therapy (Signed)
[place], [place], [address] Phone: [phone]   Fax:  [phone]  Physical Therapy Treatment  Patient Details  Name: Danny Hopkins MRN: [id number] Date of Birth: 1960/06/18 Referring Provider: Mallie Mussel A. Pool, MD  Encounter Date: 10/29/2015      PT End of Session - 10/29/15 1019    Visit Number 9   Number of Visits 13   Date for PT Re-Evaluation 11/13/15   Authorization Type Medicare- KX at visit 15   PT Start Time 1015   PT Stop Time 1110   PT Time Calculation (min) 55 min      Past Medical History:  Diagnosis Date  . Arthritis   . History of chicken pox   . 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 Stroke [facility])     Past Surgical History:  Procedure Laterality Date  . cervical staph infection     2006  . INGUINAL HERNIA REPAIR     kindergarten    There were no vitals filed for this visit.          [facility] PT Assessment - 10/29/15 0001      Standardized Balance Assessment   Standardized Balance Assessment Berg Balance Test     Berg Balance Test   Sit to Stand Able to stand without using hands and stabilize independently   Standing Unsupported Able to stand safely 2 minutes   Sitting with Back Unsupported but Feet Supported on Floor or Stool Able to sit safely and securely 2 minutes   Stand to Sit Sits safely with minimal use of hands   Transfers Able to transfer safely, minor use of hands   Standing Unsupported with Eyes Closed Able to stand 10 seconds safely   Standing Ubsupported with Feet Together Able to place feet together independently and stand 1 minute safely   From Standing, Reach Forward with Outstretched Arm Can reach confidently >25 cm (10")   From Standing Position, Pick up Object from Floor Able to pick up shoe safely and easily   From Standing Position, Turn to Look Behind Over each Shoulder Looks behind from both sides and weight shifts well   Turn 360 Degrees Able to turn 360 degrees safely one side only in 4 seconds or less   Standing Unsupported, Alternately Place Feet on Step/Stool Able to stand independently and safely and complete 8 steps in 20 seconds   Standing Unsupported, One Foot in Front Able to place foot tandem independently and hold 30 seconds   Standing on One Leg Able to lift leg independently and hold > 10 seconds  20 sec   Total Score 55   Berg comment: Low Fall Risk                      [facility] Adult PT Treatment/Exercise - 10/29/15 0001      Lumbar Exercises: Stretches   Quadruped Mid Back Stretch 1 rep;60 seconds   Quadruped Mid Back Stretch Limitations childs pose, increased right knee pain     Knee/Hip Exercises: Aerobic   Nustep 9 min L6     Knee/Hip Exercises: Standing   Other Standing Knee Exercises Squat to pick up 5 lb box from floor x 10 with good safety and mechanics     Knee/Hip Exercises: Prone   Hip Extension 10 reps  Tra contract x 10 then hold during hip extensions   Hip Extension Limitations alternating x 10  Moist Heat Therapy   Number Minutes Moist Heat 15 Minutes   Moist Heat Location Lumbar Spine     Manual Therapy   Soft tissue mobilization thoracic to lumbar paraspinals                  PT Short Term Goals - 10/20/15 0938      PT SHORT TERM GOAL #1   Title verbalize notable improvement in stiffness upon getting up by using exercises by 7/28   Time 3   Period Weeks   Status Achieved     PT SHORT TERM GOAL #2   Title able to ambulate properly and safely with assistive device   Status Achieved           PT Long Term Goals - 10/29/15 1038      PT LONG TERM GOAL #1   Title Progress to Low fall risk on BERG by 8/18   Time 6   Period Weeks   Status Achieved     PT LONG TERM GOAL #2   Title able to squat down to lift box with 5lb and return to standing safely   Time 6   Period Weeks   Status Achieved     PT LONG TERM GOAL #4    Title demo gait pattern without scissoring for 13f   Time 6   Period Weeks   Status Partially Met     PT LONG TERM GOAL #5   Title pain <3/10 upon getting out of bed in the morning   Baseline 5/10   Time 6   Period Weeks   Status On-going               Plan - 10/29/15 1021    Clinical Impression Statement Pt reports PT has helped improve his morning stiffness. He also reports the posture education has improved the pain he was having upon standing after prolonged sitting. He has improved his gait mechanics and can decrease the scissoring with cues. His BERG 55/56. Pt reports improved balance at night wen he wakes up for bathroom trips. LTG#1,#2 Met.    PT Next Visit Plan 2 more visits and discharge   PT Home Exercise Plan LTR, SKTC, heel press (pre bridge), gait awareness to avoid scissoring      Patient will benefit from skilled therapeutic intervention in order to improve the following deficits and impairments:  Abnormal gait, Improper body mechanics, Pain, Postural dysfunction, Increased muscle spasms, Decreased mobility, Decreased strength, Difficulty walking, Decreased balance  Visit Diagnosis: Bilateral low back pain without sciatica  Repeated falls  Muscle weakness (generalized)  Difficulty in walking, not elsewhere classified     Problem List Patient Active Problem List   Diagnosis Date Noted  . Scalp pain 07/30/2015  . Muscle spasticity 06/24/2015  . Dandruff 06/24/2015  . Lymphadenitis 06/24/2015  . Burning with urination 01/25/2015  . Balanitis 01/25/2015  . Sleeping difficulties 02/04/2014  . Bladder neck obstruction 02/04/2014  . Hyperlipidemia 02/04/2014  . Other malaise and fatigue 07/24/2013  . [place]  . Routine general medical examination at a health care facility 07/24/2013  . 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 Insomnia 10/12/2012  . Generalized anxiety disorder 10/12/2012  . Eczema 10/12/2012  . History of stroke  10/12/2012    DDorene Ar PTA 10/29/2015, 11:30 AM  [facility] [address]Phone: [phone]  Fax:  [phone] Name: MJozeph PersingMRN:  [id number] Date of Birth: 08/20/1960

## 2015-11-03 ENCOUNTER — Ambulatory Visit: Payer: Medicare Other | Admitting: Physical Therapy

## 2015-11-03 DIAGNOSIS — M6281 Muscle weakness (generalized): Secondary | ICD-10-CM

## 2015-11-03 DIAGNOSIS — M545 Low back pain, unspecified: Secondary | ICD-10-CM

## 2015-11-03 DIAGNOSIS — R262 Difficulty in walking, not elsewhere classified: Secondary | ICD-10-CM

## 2015-11-03 DIAGNOSIS — R296 Repeated falls: Secondary | ICD-10-CM | POA: Diagnosis not present

## 2015-11-03 NOTE — Patient Instructions (Addendum)
Abduction: Clam (Eccentric) - Side-Lying    Lie on side with knees bent. Lift top knee, keeping feet together. Keep trunk steady. Slowly lower for 3-5 seconds. _10-30__ reps per set, _2__ sets per day, __7_ days per week.    Bridging    Lie on back with feet shoulder width apart. Lift hips toward the ceiling. Hold __5__ seconds. Repeat _10-20___ times. Do __2__ sessions per day.   Straight Leg Raise    Tighten stomach and slowly raise locked right leg __12__ inches from floor. Repeat 10 times per set. Do __2__ sets per session. Do 2____ sessions per day.  http://orth.exer.us/1102   Copyright  VHI. All rights reserved.

## 2015-11-03 NOTE — Therapy (Addendum)
[place], [place], [address] Phone: [phone]   Fax:  [phone]  Physical Therapy Treatment  Patient Details  Name: Danny Hopkins MRN: [id number] Date of Birth: 19-Feb-1961 Referring Provider: Mallie Mussel A. Pool, MD  Encounter Date: 11/03/2015      PT End of Session - 11/03/15 1050    Visit Number 10   Number of Visits 13   Date for PT Re-Evaluation 11/13/15   Authorization Type Medicare- KX at visit 15   PT Start Time 1015   PT Stop Time 1115   PT Time Calculation (min) 60 min      Past Medical History:  Diagnosis Date  . Arthritis   . History of chicken pox   . 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 Stroke [facility])     Past Surgical History:  Procedure Laterality Date  . cervical staph infection     2006  . INGUINAL HERNIA REPAIR     kindergarten    There were no vitals filed for this visit.      Subjective Assessment - 11/03/15 1051    Subjective Less overall pain today however increased spasms, usually when it's raining.    Currently in Pain? Yes   Pain Score 5    Pain Location Back   Pain Orientation Left   Aggravating Factors  straightening up, laying flat, rainy weather   Pain Relieving Factors good nights sleep                         [facility] Adult PT Treatment/Exercise - 11/03/15 0001      Lumbar Exercises: Stretches   Single Knee to Chest Stretch 3 reps;30 seconds   Prone on Elbows Stretch 1 rep;60 seconds     Lumbar Exercises: Supine   Bent Knee Raise 10 reps   Bent Knee Raise Limitations with ab draw in   Bridge 10 reps   Isometric Hip Flexion 10 reps   Isometric Hip Flexion Limitations with ab draw in     Knee/Hip Exercises: Aerobic   Nustep 10 min L6     Knee/Hip Exercises: Sidelying   Clams x30 each     Knee/Hip Exercises: Prone   Hip Extension Limitations alternating x 10  Tra contract x 10     Moist Heat Therapy    Number Minutes Moist Heat 15 Minutes   Moist Heat Location Lumbar Spine                PT Education - 11/03/15 1058    Education provided Yes   Education Details HEP   Person(s) Educated Patient   Methods Explanation;Handout   Comprehension Verbalized understanding;Returned demonstration          PT Short Term Goals - 10/20/15 0938      PT SHORT TERM GOAL #1   Title verbalize notable improvement in stiffness upon getting up by using exercises by 7/28   Time 3   Period Weeks   Status Achieved     PT SHORT TERM GOAL #2   Title able to ambulate properly and safely with assistive device   Status Achieved           PT Long Term Goals - 11/03/15 1111      PT LONG TERM GOAL #1   Title Progress to Low fall risk on BERG by 8/18   Status Achieved     PT LONG TERM  GOAL #2   Title able to squat down to lift box with 5lb and return to standing safely   Status Achieved     PT LONG TERM GOAL #4   Title demo gait pattern without scissoring for 63f   Status Achieved     PT LONG TERM GOAL #5   Title pain <3/10 upon getting out of bed in the morning   Baseline 5/10   Time 6   Period Weeks   Status On-going               Plan - 02017/08/131109    Clinical Impression Statement Established additional hip and core strengthening HEP as pt would like next viist to be his last. He can decrease scissoring gait for 50 ft with cues using SPC. His BERG has improved to 55/56. Trial of gait with head turns and nods with only one inital episode of staggering. He continues to have morning spasms upon straightening his trunk and after sitting and then standing. Also with pront lying. They resolve quickly however are sharp. LTG#4 Met.    PT Next Visit Plan discharge next visit. Review HEP and finalize       Patient will benefit from skilled therapeutic intervention in order to improve the following deficits and impairments:  Abnormal gait, Improper body mechanics, Pain,  Postural dysfunction, Increased muscle spasms, Decreased mobility, Decreased strength, Difficulty walking, Decreased balance  Visit Diagnosis: Bilateral low back pain without sciatica  Repeated falls  Muscle weakness (generalized)  Difficulty in walking, not elsewhere classified       G-Codes - 008/13/171731    Functional Assessment Tool Used clinical judgement, improved safety in walking   Functional Limitation Mobility: Walking and moving around   Mobility: Walking and Moving Around Current Status [phone] At least 20 percent but less than 40 percent impaired, limited or restricted   Mobility: Walking and Moving Around Goal Status [phone] At least 20 percent but less than 40 percent impaired, limited or restricted   Mobility: Walking and Moving Around Discharge Status [phone] At least 20 percent but less than 40 percent impaired, limited or restricted      Problem List Patient Active Problem List   Diagnosis Date Noted  . Scalp pain 07/30/2015  . Muscle spasticity 06/24/2015  . Dandruff 06/24/2015  . Lymphadenitis 06/24/2015  . Burning with urination 01/25/2015  . Balanitis 01/25/2015  . Sleeping difficulties 02/04/2014  . Bladder neck obstruction 02/04/2014  . Hyperlipidemia 02/04/2014  . Other malaise and fatigue 07/24/2013  . [place]  . Routine general medical examination at a health care facility 07/24/2013  . 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 Insomnia 10/12/2012  . Generalized anxiety disorder 10/12/2012  . Eczema 10/12/2012  . History of stroke 10/12/2012    Jesi Jurgens C. Hightower PT, DPT [id number] PM   CGu[facility] MillsCG[facility] LLC[address]GMar[place] [address]Phone: [phone]  Fax:  [phone] Name: Danny OsterhoutMRN: [id number] of Birth: 208-09-1960

## 2015-11-05 ENCOUNTER — Ambulatory Visit: Payer: Medicare Other | Admitting: Physical Therapy

## 2015-11-05 ENCOUNTER — Encounter: Payer: Self-pay | Admitting: Physical Therapy

## 2015-11-05 ENCOUNTER — Telehealth: Payer: Self-pay | Admitting: *Deleted

## 2015-11-05 DIAGNOSIS — R262 Difficulty in walking, not elsewhere classified: Secondary | ICD-10-CM | POA: Diagnosis not present

## 2015-11-05 DIAGNOSIS — M545 Low back pain, unspecified: Secondary | ICD-10-CM

## 2015-11-05 DIAGNOSIS — M6281 Muscle weakness (generalized): Secondary | ICD-10-CM

## 2015-11-05 DIAGNOSIS — L219 Seborrheic dermatitis, unspecified: Secondary | ICD-10-CM | POA: Diagnosis not present

## 2015-11-05 DIAGNOSIS — R296 Repeated falls: Secondary | ICD-10-CM | POA: Diagnosis not present

## 2015-11-05 MED ORDER — ALPRAZOLAM 1 MG PO TABS: 1.0000 mg | ORAL_TABLET | Freq: Two times a day (BID) | ORAL | 2 refills | Status: DC | PRN

## 2015-11-05 NOTE — Telephone Encounter (Signed)
Faxed to pharmacy

## 2015-11-05 NOTE — Telephone Encounter (Signed)
Done hardcopy to Corinne  

## 2015-11-05 NOTE — Telephone Encounter (Signed)
Rec'd call pt wanting to get a refill on his alprazolam. He states med is due to be refill on Sat...Danny Hopkins

## 2015-11-05 NOTE — Therapy (Signed)
[place], [place], [address] Phone: [phone]   Fax:  [phone]  Physical Therapy Treatment/Discharge Summary  Patient Details  Name: Danny Hopkins MRN: [id number] Date of Birth: Feb 21, 1961 Referring Provider: Mallie Mussel A. Pool, MD  Encounter Date: 11/05/2015      PT End of Session - 11/05/15 0935    Visit Number 11   Number of Visits 13   Date for PT Re-Evaluation 11/13/15   Authorization Type Medicare- KX at visit 15   PT Start Time 0930   PT Stop Time 1030   PT Time Calculation (min) 60 min   Activity Tolerance Patient tolerated treatment well   Behavior During Therapy [facility] for tasks assessed/performed      Past Medical History:  Diagnosis Date  . Arthritis   . History of chicken pox   . 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 Stroke [facility])     Past Surgical History:  Procedure Laterality Date  . cervical staph infection     2006  . INGUINAL HERNIA REPAIR     kindergarten    There were no vitals filed for this visit.      Subjective Assessment - 11/05/15 0935    Subjective Pt reports today is not a good day today due to lack of sleep last night. Overall pain has been good with today as an exception. Notices he does not get as tired as quickly as he used to.    How long can you walk comfortably? able to walk the mall 1 time   Patient Stated Goals squat down and return to standing- feels he has met this goal at d/c   Pain Score 7    Pain Location Back   Pain Orientation Lower;Left   Pain Descriptors / Indicators Aching            [facility] PT Assessment - 11/05/15 0001      Posture/Postural Control   Posture Comments resting at 10 deg flx     Strength   Left Shoulder Internal Rotation 4+/5   Left Shoulder Horizontal ADduction 4/5   Right Hip External Rotation  5/5   Right Hip Internal Rotation 5/5   Left Hip Flexion 5/5   Left Hip External Rotation 5/5    Left Hip Internal Rotation 5/5   Right Knee Flexion 5/5   Left Knee Flexion 5/5     Ambulation/Gait   Assistive device Straight cane   Gait Comments appropraite width of BOS and trunk rotation; good heel toe pattern; slow cadence with cont flexed posture from waist                      [facility] Adult PT Treatment/Exercise - 11/05/15 0001      Lumbar Exercises: Stretches   Single Knee to Chest Stretch 30 seconds;2 reps   Lower Trunk Rotation Limitations x10 ea     Lumbar Exercises: Supine   Ab Set 20 reps   AB Set Limitations with iso abd   Bridge 10 reps;5 seconds     Knee/Hip Exercises: Supine   Straight Leg Raises 10 reps;Both   Straight Leg Raises Limitations cues for abdominal bracing     Knee/Hip Exercises: Sidelying   Clams x30 each     Moist Heat Therapy   Number Minutes Moist Heat 25 Minutes  10' beg of session concurrent with goals discussion   Moist Heat Location Lumbar Spine  PT Education - 11/05/15 [phone]    Education provided Yes   Education Details exercise form/rationale, HEP   Person(s) Educated Patient   Methods Explanation;Demonstration;Tactile cues;Verbal cues   Comprehension Verbalized understanding;Returned demonstration;Verbal cues required;Tactile cues required          PT Short Term Goals - 10/20/15 0938      PT SHORT TERM GOAL #1   Title verbalize notable improvement in stiffness upon getting up by using exercises by 7/28   Time 3   Period Weeks   Status Achieved     PT SHORT TERM GOAL #2   Title able to ambulate properly and safely with assistive device   Status Achieved           PT Long Term Goals - 11/05/15 3825      PT LONG TERM GOAL #1   Title Progress to Low fall risk on BERG by 8/18   Status Achieved     PT LONG TERM GOAL #2   Title able to squat down to lift box with 5lb and return to standing safely   Status Achieved     PT LONG TERM GOAL #3   Title FOTO to 64% ability to  indicate significant functional improvement   Status Deferred     PT LONG TERM GOAL #4   Title demo gait pattern without scissoring for 48f   Status Achieved     PT LONG TERM GOAL #5   Title pain <3/10 upon getting out of bed in the morning   Baseline 2/10   Status Achieved               Plan - 11/05/15 1012    Clinical Impression Statement Pt has made good progress with physical therapy and is ready to be d/c to independent HEP at this time. Pt has occasional days of back pain, esp when he has not slept well, and was educated on importance of abdominal bracing in all positions to support lumbar spine. Pt verbalized comfort and understanding of HEP and was instructed to contact [facility] any further questions/needs.    PT Treatment/Interventions ADLs/Self Care Home Management;Cryotherapy;Electrical Stimulation;Ultrasound;Iontophoresis 498mml Dexamethasone;Moist Heat;Gait training;Stair training;Functional mobility training;Therapeutic activities;Therapeutic exercise;Balance training;Patient/family education;Neuromuscular re-education;Manual techniques;Taping;Dry needling;Passive range of motion   Consulted and Agree with Plan of Care Patient      Patient will benefit from skilled therapeutic intervention in order to improve the following deficits and impairments:  Abnormal gait, Improper body mechanics, Pain, Postural dysfunction, Increased muscle spasms, Decreased mobility, Decreased strength, Difficulty walking, Decreased balance  Visit Diagnosis: Bilateral low back pain without sciatica  Repeated falls  Muscle weakness (generalized)  Difficulty in walking, not elsewhere classified     Problem List Patient Active Problem List   Diagnosis Date Noted  . Scalp pain 07/30/2015  . Muscle spasticity 06/24/2015  . Dandruff 06/24/2015  . Lymphadenitis 06/24/2015  . Burning with urination 01/25/2015  . Balanitis 01/25/2015  . Sleeping difficulties 02/04/2014  . Bladder  neck obstruction 02/04/2014  . Hyperlipidemia 02/04/2014  . Other malaise and fatigue 07/24/2013  . [place]  . Routine general medical examination at a health care facility 07/24/2013  . 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 Insomnia 10/12/2012  . Generalized anxiety disorder 10/12/2012  . Eczema 10/12/2012  . History of stroke 10/12/2012   PHYSICAL THERAPY DISCHARGE SUMMARY  Visits from Start of Care: 11  Current functional level related to goals / functional outcomes: See above   Remaining deficits: See  above   Education / Equipment: Anatomy of condition, POC, HEP, exercise form/rationale  Plan: Patient agrees to discharge.  Patient goals were met. Patient is being discharged due to meeting the stated rehab goals.  ?????       Dionte Blaustein C. Deanna Wiater PT, DPT 11/05/15 10:18 AM   [place] [facility] [address] [place], [place], [address] Phone: [phone]   Fax:  [phone]  Name: Danny Hopkins MRN: [id number] Date of Birth: 28-Mar-1961

## 2015-11-11 ENCOUNTER — Telehealth: Payer: Self-pay | Admitting: Internal Medicine

## 2015-11-11 NOTE — Telephone Encounter (Signed)
Rec'd from [facility] forward 5 pages to [place]

## 2015-12-23 ENCOUNTER — Encounter: Payer: Self-pay | Admitting: Internal Medicine

## 2015-12-23 ENCOUNTER — Ambulatory Visit (INDEPENDENT_AMBULATORY_CARE_PROVIDER_SITE_OTHER): Payer: Medicare Other | Admitting: Internal Medicine

## 2015-12-23 VITALS — BP 128/82 | HR 98 | Temp 98.4°F | Resp 20 | Wt 143.1 lb

## 2015-12-23 DIAGNOSIS — Z23 Encounter for immunization: Secondary | ICD-10-CM | POA: Diagnosis not present

## 2015-12-23 DIAGNOSIS — A599 Trichomoniasis, unspecified: Secondary | ICD-10-CM | POA: Diagnosis not present

## 2015-12-23 DIAGNOSIS — F411 Generalized anxiety disorder: Secondary | ICD-10-CM | POA: Diagnosis not present

## 2015-12-23 DIAGNOSIS — E785 Hyperlipidemia, unspecified: Secondary | ICD-10-CM

## 2015-12-23 DIAGNOSIS — Z8673 Personal history of transient ischemic attack (TIA), and cerebral infarction without residual deficits: Secondary | ICD-10-CM

## 2015-12-23 MED ORDER — METRONIDAZOLE 500 MG PO TABS: ORAL_TABLET | ORAL | 0 refills | Status: DC

## 2015-12-23 NOTE — Patient Instructions (Addendum)
You had the flu shot today, and the Tetanus (Td)  Please take all new medication as prescribed - the Aspirin 81 mg per day  Please take all new medication as prescribed - the antibiotic (only 4 pills to take all at one time)  Please continue all other medications as before, and refills have been done if requested.  Please have the pharmacy call with any other refills you may need.  Please keep your appointments with your specialists as you may have planned  Please return in 6 months, or sooner if needed

## 2015-12-23 NOTE — Progress Notes (Signed)
Pre visit review using our clinic review tool, if applicable. No additional management support is needed unless otherwise documented below in the visit note.    MD ordered TD vaccine instead of TDAP. Patient received vaccine prior to signing ABN. ABN triggered. Billed to medicare. Patient left prior to being informed of potential bill of vaccine not being covered.

## 2015-12-23 NOTE — Progress Notes (Signed)
Subjective:    Patient ID: Danny Hopkins, male    DOB: 08/14/1960, 55 y.o.   MRN: [id number]  HPI  Here for yearly f/u;  Overall doing ok;  Pt denies Chest pain, worsening SOB, DOE, wheezing, orthopnea, PND, worsening LE edema, palpitations, dizziness or syncope.  Pt denies neurological change such as new headache, facial or extremity weakness, still with significant muscular spasms on each time standing up from sitting.  .  Pt denies polydipsia, polyuria, or low sugar symptoms. Pt states overall good compliance with treatment and medications, good tolerability, and has been trying to follow appropriate diet.  Pt denies worsening depressive symptoms, suicidal ideation or panic. No fever, night sweats, wt loss, loss of appetite, or other constitutional symptoms.  Pt states good ability with ADL's, has low fall risk, home safety reviewed and adequate, no other significant changes in hearing or vision, Denies urinary symptoms such as dysuria, frequency, urgency, flank pain, hematuria or n/v, fever, chills.  Has not had sexual relations in last 78mo, and vague about last partner Past Medical History:  Diagnosis Date  . Arthritis   . History of chicken pox   . 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 Stroke [facility])    Past Surgical History:  Procedure Laterality Date  . cervical staph infection     2006  . INGUINAL HERNIA REPAIR     kindergarten    reports that he has never smoked. He has never used smokeless tobacco. He reports that he drinks about 1.2 oz of alcohol per week . He reports that he uses drugs, including Marijuana. family history is not on file. No Known Allergies Current Outpatient Prescriptions on File Prior to Visit  Medication Sig Dispense Refill  . ALPRAZolam (XANAX) 1 MG tablet Take 1 tablet (1 mg total) by mouth 2 (two) times daily as needed. 60 tablet 2  . atorvastatin (LIPITOR) 20 MG tablet TAKE 1 TABLET (20 MG TOTAL) BY MOUTH DAILY. 90 tablet 3  .  baclofen (LIORESAL) 10 MG tablet Take 1 tablet (10 mg total) by mouth 3 (three) times daily as needed for muscle spasms. 90 each 1  . clotrimazole-betamethasone (LOTRISONE) cream Apply topically 2 (two) times daily. 30 g 0  . doxycycline (VIBRA-TABS) 100 MG tablet Take 1 tablet (100 mg total) by mouth 2 (two) times daily. 20 tablet 0  . HYDROcodone-acetaminophen (NORCO) 10-325 MG per tablet Take 1 tablet by mouth every 8 (eight) hours as needed.     Marland Kitchen ketoconazole (NIZORAL) 2 % shampoo Apply 1 application topically 2 (two) times a week. 120 mL 3  . sildenafil (VIAGRA) 100 MG tablet Take 0.5-1 tablets (50-100 mg total) by mouth daily as needed for erectile dysfunction. 5 tablet 11  . tamsulosin (FLOMAX) 0.4 MG CAPS capsule TAKE 1 CAPSULE (0.4 MG TOTAL) BY MOUTH DAILY. 90 capsule 2   No current facility-administered medications on file prior to visit.    Review of Systems  Constitutional: Negative for unusual diaphoresis or night sweats HENT: Negative for ear swelling or discharge Eyes: Negative for worsening visual haziness  Respiratory: Negative for choking and stridor.   Gastrointestinal: Negative for distension or worsening eructation Genitourinary: Negative for retention or change in urine volume.  Musculoskeletal: Negative for other MSK pain or swelling Skin: Negative for color change and worsening wound Neurological: Negative for tremors and numbness other than noted  Psychiatric/Behavioral: Negative for decreased concentration or agitation other than above  Objective:   Physical Exam BP 128/82   Pulse 98   Temp 98.4 F (36.9 C) (Oral)   Resp 20   Wt 143 lb 2 oz (64.9 kg)   SpO2 96%   BMI 22.42 kg/m  VS noted,  Constitutional: Pt appears in no apparent distress HENT: Head: NCAT.  Right Ear: External ear normal.  Left Ear: External ear normal.  Eyes: . Pupils are equal, round, and reactive to light. Conjunctivae and EOM are normal Neck: Normal range of motion. Neck  supple.  Cardiovascular: Normal rate and regular rhythm.   Pulmonary/Chest: Effort normal and breath sounds without rales or wheezing.  Abd:  Soft, NT, ND, + BS Neurological: Pt is alert. Not confused , o/w not done in detail Skin: Skin is warm. No rash, no LE edema Psychiatric: Pt behavior is normal. No agitation. , mild nervous GU: normal male, no penile d/c, NT, scrotal contents no swelling       Assessment & Plan:

## 2015-12-26 NOTE — Assessment & Plan Note (Signed)
stable overall by history and exam, recent data reviewed with pt, and pt to continue medical treatment as before,  to f/u any worsening symptoms or concerns Lab Results  Component Value Date   LDLCALC 72 06/24/2015

## 2015-12-26 NOTE — Assessment & Plan Note (Signed)
Stable, to start asa 81 qd, cont statin,  to f/u any worsening symptoms or concerns

## 2015-12-26 NOTE — Assessment & Plan Note (Signed)
stable overall by history and exam, recent data reviewed with pt, and pt to continue medical treatment as before,  to f/u any worsening symptoms or concerns Lab Results  Component Value Date   WBC 6.8 06/24/2015   HGB 17.1 (H) 06/24/2015   HCT 50.5 06/24/2015   PLT 198.0 06/24/2015   GLUCOSE 81 06/24/2015   CHOL 139 06/24/2015   TRIG 78.0 06/24/2015   HDL 51.10 06/24/2015   LDLCALC 72 06/24/2015   ALT 19 06/24/2015   AST 19 06/24/2015   NA 141 06/24/2015   K 4.6 06/24/2015   CL 104 06/24/2015   CREATININE 0.99 06/24/2015   BUN 15 06/24/2015   CO2 30 06/24/2015   TSH 0.89 06/24/2015   PSA 0.36 06/24/2015

## 2015-12-26 NOTE — Assessment & Plan Note (Signed)
On last ua this is noted; pt asympt, vague on sexual partner and states cannot determine last contact to inform for tx, for tx flagyl asd,  to f/u any worsening symptoms or concerns

## 2016-02-01 ENCOUNTER — Other Ambulatory Visit: Payer: Self-pay | Admitting: Internal Medicine

## 2016-02-01 ENCOUNTER — Telehealth: Payer: Self-pay | Admitting: *Deleted

## 2016-02-01 MED ORDER — ALPRAZOLAM 1 MG PO TABS: 1.0000 mg | ORAL_TABLET | Freq: Two times a day (BID) | ORAL | 2 refills | Status: DC | PRN

## 2016-02-01 NOTE — Telephone Encounter (Signed)
Done hardcopy to Corinne  

## 2016-02-01 NOTE — Telephone Encounter (Signed)
Script [facility]ed to [facility].../lmb 

## 2016-02-01 NOTE — Telephone Encounter (Signed)
Rec'd call pt requesting refill on his alprazolam. Need sent to [facility]...Danny Hopkins

## 2016-03-09 DIAGNOSIS — M4712 Other spondylosis with myelopathy, cervical region: Secondary | ICD-10-CM | POA: Diagnosis not present

## 2016-04-25 ENCOUNTER — Telehealth: Payer: Self-pay | Admitting: *Deleted

## 2016-04-25 NOTE — Telephone Encounter (Signed)
Left msg on triage requesting refill on his alprazolam.../lmb

## 2016-04-26 ENCOUNTER — Other Ambulatory Visit: Payer: Self-pay | Admitting: Internal Medicine

## 2016-04-26 MED ORDER — ALPRAZOLAM 1 MG PO TABS: 1.0000 mg | ORAL_TABLET | Freq: Two times a day (BID) | ORAL | 2 refills | Status: DC | PRN

## 2016-04-26 NOTE — Telephone Encounter (Signed)
We usually refill the xanax no more than a few days ahead of when needs refill  Last rx was for 3 mo total refills , filled on or after Nov 13  So next rx is not due until Feb 11

## 2016-04-26 NOTE — Telephone Encounter (Signed)
Pt called in and said that the pharmacy does not have his xanex and would like to know if this can be rec faxed.  He said he just check with them 3:28pm

## 2016-04-26 NOTE — Telephone Encounter (Signed)
[facility]ed [facility] into [facility] left on pharmacy vm.../lmb 

## 2016-04-26 NOTE — Telephone Encounter (Signed)
faxed

## 2016-06-04 ENCOUNTER — Other Ambulatory Visit: Payer: Self-pay | Admitting: Internal Medicine

## 2016-06-04 DIAGNOSIS — L309 Dermatitis, unspecified: Secondary | ICD-10-CM

## 2016-06-18 ENCOUNTER — Other Ambulatory Visit: Payer: Self-pay | Admitting: Internal Medicine

## 2016-06-21 ENCOUNTER — Ambulatory Visit (INDEPENDENT_AMBULATORY_CARE_PROVIDER_SITE_OTHER): Payer: Medicare Other | Admitting: Internal Medicine

## 2016-06-21 ENCOUNTER — Other Ambulatory Visit (INDEPENDENT_AMBULATORY_CARE_PROVIDER_SITE_OTHER): Payer: Medicare Other

## 2016-06-21 ENCOUNTER — Encounter: Payer: Self-pay | Admitting: Internal Medicine

## 2016-06-21 VITALS — BP 98/64 | HR 66 | Temp 98.1°F | Wt 156.0 lb

## 2016-06-21 DIAGNOSIS — F17200 Nicotine dependence, unspecified, uncomplicated: Secondary | ICD-10-CM

## 2016-06-21 DIAGNOSIS — N320 Bladder-neck obstruction: Secondary | ICD-10-CM | POA: Diagnosis not present

## 2016-06-21 DIAGNOSIS — R269 Unspecified abnormalities of gait and mobility: Secondary | ICD-10-CM

## 2016-06-21 DIAGNOSIS — Z114 Encounter for screening for human immunodeficiency virus [HIV]: Secondary | ICD-10-CM

## 2016-06-21 DIAGNOSIS — F411 Generalized anxiety disorder: Secondary | ICD-10-CM

## 2016-06-21 DIAGNOSIS — E785 Hyperlipidemia, unspecified: Secondary | ICD-10-CM

## 2016-06-21 DIAGNOSIS — Z87891 Personal history of nicotine dependence: Secondary | ICD-10-CM | POA: Insufficient documentation

## 2016-06-21 LAB — PSA: PSA: 0.33 ng/mL (ref 0.10–4.00)

## 2016-06-21 LAB — CBC WITH DIFFERENTIAL/PLATELET
BASOS ABS: 0 10*3/uL (ref 0.0–0.1)
Basophils Relative: 0.8 % (ref 0.0–3.0)
EOS PCT: 2.1 % (ref 0.0–5.0)
Eosinophils Absolute: 0.1 10*3/uL (ref 0.0–0.7)
HCT: 51.9 % (ref 39.0–52.0)
Hemoglobin: 17.4 g/dL — ABNORMAL HIGH (ref 13.0–17.0)
Lymphocytes Relative: 41.6 % (ref 12.0–46.0)
Lymphs Abs: 2.5 10*3/uL (ref 0.7–4.0)
MCHC: 33.4 g/dL (ref 30.0–36.0)
MCV: 91.3 fl (ref 78.0–100.0)
MONO ABS: 0.7 10*3/uL (ref 0.1–1.0)
MONOS PCT: 12.4 % — AB (ref 3.0–12.0)
NEUTROS ABS: 2.6 10*3/uL (ref 1.4–7.7)
NEUTROS PCT: 43.1 % (ref 43.0–77.0)
PLATELETS: 194 10*3/uL (ref 150.0–400.0)
RBC: 5.68 Mil/uL (ref 4.22–5.81)
RDW: 14.6 % (ref 11.5–15.5)
WBC: 5.9 10*3/uL (ref 4.0–10.5)

## 2016-06-21 LAB — BASIC METABOLIC PANEL
BUN: 13 mg/dL (ref 6–23)
CO2: 30 meq/L (ref 19–32)
Calcium: 9.7 mg/dL (ref 8.4–10.5)
Chloride: 104 mEq/L (ref 96–112)
Creatinine, Ser: 1 mg/dL (ref 0.40–1.50)
GFR: 100.12 mL/min (ref >60.00)
Glucose, Bld: 92 mg/dL (ref 70–99)
POTASSIUM: 4.2 meq/L (ref 3.5–5.1)
SODIUM: 140 meq/L (ref 135–145)

## 2016-06-21 LAB — URINALYSIS, ROUTINE W REFLEX MICROSCOPIC
BILIRUBIN URINE: NEGATIVE
HGB URINE DIPSTICK: NEGATIVE
Ketones, ur: NEGATIVE
Leukocytes, UA: NEGATIVE
NITRITE: NEGATIVE
RBC / HPF: NONE SEEN (ref 0–?)
Specific Gravity, Urine: 1.02 (ref 1.000–1.030)
Total Protein, Urine: NEGATIVE
Urine Glucose: NEGATIVE
Urobilinogen, UA: 0.2 (ref 0.0–1.0)
WBC, UA: NONE SEEN (ref 0–?)
pH: 6 (ref 5.0–8.0)

## 2016-06-21 LAB — LIPID PANEL
CHOLESTEROL: 141 mg/dL (ref 0–200)
HDL: 50.5 mg/dL (ref >39.00)
LDL Cholesterol: 76 mg/dL (ref 0–99)
NONHDL: 90.72
Total CHOL/HDL Ratio: 3
Triglycerides: 72 mg/dL (ref 0.0–149.0)
VLDL: 14.4 mg/dL (ref 0.0–40.0)

## 2016-06-21 LAB — HEPATIC FUNCTION PANEL
ALBUMIN: 4.6 g/dL (ref 3.5–5.2)
ALT: 13 U/L (ref 0–53)
AST: 15 U/L (ref 0–37)
Alkaline Phosphatase: 75 U/L (ref 39–117)
BILIRUBIN DIRECT: 0.2 mg/dL (ref 0.0–0.3)
TOTAL PROTEIN: 6.9 g/dL (ref 6.0–8.3)
Total Bilirubin: 1.1 mg/dL (ref 0.2–1.2)

## 2016-06-21 LAB — TSH: TSH: 2.23 u[IU]/mL (ref 0.35–4.50)

## 2016-06-21 NOTE — Assessment & Plan Note (Signed)
Urged to quit his occasional cigar use

## 2016-06-21 NOTE — Patient Instructions (Signed)

## 2016-06-21 NOTE — Progress Notes (Signed)
Pre visit review using our clinic review tool, if applicable. No additional management support is needed unless otherwise documented below in the visit note. 

## 2016-06-21 NOTE — Progress Notes (Signed)
Subjective:    Patient ID: Danny Hopkins, male    DOB: 05/09/1960, 56 y.o.   MRN: [id number]  HPI  Here for yearly f/u;  Overall doing ok;  Pt denies Chest pain, worsening SOB, DOE, wheezing, orthopnea, PND, worsening LE edema, palpitations, dizziness or syncope.  Pt denies neurological change such as new headache, facial or extremity weakness.  Pt denies polydipsia, polyuria, or low sugar symptoms. Pt states overall good compliance with treatment and medications, good tolerability, and has been trying to follow appropriate diet.  Pt denies worsening depressive symptoms, suicidal ideation or panic. No fever, night sweats, wt loss, loss of appetite, or other constitutional symptoms.  Pt states good ability with ADL's, has low fall risk, home safety reviewed and adequate, no other significant changes in hearing or vision, and only occasionally active with exercise. Still smoking occasional cigar. Denies urinary symptoms such as dysuria, frequency, urgency, flank pain, hematuria or n/v, fever, chills.No other new history Past Medical History:  Diagnosis Date  . Arthritis   . History of chicken pox   . 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 Stroke [facility])    Past Surgical History:  Procedure Laterality Date  . cervical staph infection     2006  . INGUINAL HERNIA REPAIR     kindergarten    reports that he has never smoked. He has never used smokeless tobacco. He reports that he drinks about 1.2 oz of alcohol per week . He reports that he uses drugs, including Marijuana. family history is not on file. No Known Allergies Current Outpatient Prescriptions on File Prior to Visit  Medication Sig Dispense Refill  . ALPRAZolam (XANAX) 1 MG tablet Take 1 tablet (1 mg total) by mouth 2 (two) times daily as needed. Ok to fill May 08, 2016 60 tablet 2  . atorvastatin (LIPITOR) 20 MG tablet TAKE 1 TABLET (20 MG TOTAL) BY MOUTH DAILY. 90 tablet 3  . baclofen (LIORESAL) 10 MG tablet  Take 1 tablet (10 mg total) by mouth 3 (three) times daily as needed for muscle spasms. 90 each 1  . clotrimazole-betamethasone (LOTRISONE) cream APPLY TO AFFECTED AREA TWICE A DAY 30 g 0  . HYDROcodone-acetaminophen (NORCO) 10-325 MG per tablet Take 1 tablet by mouth every 8 (eight) hours as needed.     Marland Kitchen ketoconazole (NIZORAL) 2 % shampoo Apply 1 application topically 2 (two) times a week. 120 mL 3  . sildenafil (VIAGRA) 100 MG tablet Take 0.5-1 tablets (50-100 mg total) by mouth daily as needed for erectile dysfunction. 5 tablet 11  . tamsulosin (FLOMAX) 0.4 MG CAPS capsule TAKE 1 CAPSULE (0.4 MG TOTAL) BY MOUTH DAILY. 90 capsule 2   No current facility-administered medications on file prior to visit.    Review of Systems  Constitutional: Negative for other unusual diaphoresis or sweats HENT: Negative for ear discharge or swelling Eyes: Negative for other worsening visual disturbances Respiratory: Negative for stridor or other swelling  Gastrointestinal: Negative for worsening distension or other blood Genitourinary: Negative for retention or other urinary change Musculoskeletal: Negative for other MSK pain or swelling Skin: Negative for color change or other new lesions Neurological: Negative for worsening tremors and other numbness  Psychiatric/Behavioral: Negative for worsening agitation or other fatigue All other system neg per pt    Objective:   Physical Exam BP 98/64   Pulse 66   Temp 98.1 F (36.7 C) (Oral)   Wt 156 lb (70.8 kg)   SpO2 99%  BMI 24.43 kg/m  VS noted,  Constitutional: Pt is oriented to person, place, and time. Appears well-developed and well-nourished, in no significant distress and comfortable Head: Normocephalic and atraumatic  Eyes: Conjunctivae and EOM are normal. Pupils are equal, round, and reactive to light Right Ear: External ear normal without discharge Left Ear: External ear normal without discharge Nose: Nose without discharge or  deformity Mouth/Throat: Oropharynx is without other ulcerations and moist  Neck: Normal range of motion. Neck supple. No JVD present. No tracheal deviation present or significant neck LA or mass Cardiovascular: Normal rate, regular rhythm, normal heart sounds and intact distal pulses.   Pulmonary/Chest: WOB normal and breath sounds without rales or wheezing  Abdominal: Soft. Bowel sounds are normal. NT. No HSM  Musculoskeletal: Normal range of motion. Exhibits no edema Lymphadenopathy: Has no other cervical adenopathy.  Neurological: Pt is alert and oriented to person, place, and time. Pt has normal reflexes. No cranial nerve deficit. Motor grossly intact, Gait intact except slowed, bent with obvious muscle spasm to lower back severe to standing up, and lying down on exam table Skin: Skin is warm and dry. No rash noted or new ulcerations Psychiatric:  Has mild nervous mood and affect. Behavior is normal without agitation No other exam findings    Assessment & Plan:

## 2016-06-22 LAB — HIV ANTIBODY (ROUTINE TESTING W REFLEX): HIV 1&2 Ab, 4th Generation: NONREACTIVE

## 2016-06-25 NOTE — Assessment & Plan Note (Signed)
Stable chronic persistent, for baclofen prn,  to f/u any worsening symptoms or concerns

## 2016-06-25 NOTE — Assessment & Plan Note (Signed)
[place] for psa as he is due, o/w asympt

## 2016-06-25 NOTE — Assessment & Plan Note (Signed)
stable overall by history and exam, and pt to continue medical treatment as before,  to f/u any worsening symptoms or concerns 

## 2016-06-25 NOTE — Assessment & Plan Note (Signed)
stable overall by history and exam, recent data reviewed with pt, and pt to continue medical treatment as before,  to f/u any worsening symptoms or concerns Lab Results  Component Value Date   LDLCALC 76 06/21/2016

## 2016-08-02 ENCOUNTER — Other Ambulatory Visit: Payer: Self-pay | Admitting: Internal Medicine

## 2016-08-02 DIAGNOSIS — M4712 Other spondylosis with myelopathy, cervical region: Secondary | ICD-10-CM | POA: Diagnosis not present

## 2016-08-03 NOTE — Telephone Encounter (Signed)
Done hardcopy to [place]  

## 2016-08-03 NOTE — Telephone Encounter (Signed)
faxed

## 2016-09-01 NOTE — Progress Notes (Addendum)
Subjective:   Danny Hopkins is a 56 y.o. male who presents for Medicare Annual/Subsequent preventive examination.  Review of Systems:  No ROS.  Medicare Wellness Visit.  Cardiac Risk Factors include: dyslipidemia;male gender Sleep patterns: feels rested on waking, gets up 1 times nightly to void and sleeps 6-8 hours nightly.    Home Safety/Smoke Alarms: Feels safe in home. Smoke alarms in place.  Living environment; residence and Firearm Safety: 1-story house/ trailer, no firearms.Lives alone, good family and friend support Seat Belt Safety/Bike Helmet: Wears seat belt.   Counseling:   Eye Exam- appointment yearly Dental- Resources provided  Male:   CCS- Last 09/24/13, precancerous polyps, recall 5 years     PSA-  Lab Results  Component Value Date   PSA 0.33 06/21/2016   PSA 0.36 06/24/2015   PSA 0.36 03/03/2014       Objective:    Vitals: BP (!) 126/54   Pulse 65   Resp 20   Ht 5\' 7"  (1.702 m)   Wt 144 lb (65.3 kg)   SpO2 99%   BMI 22.55 kg/m   Body mass index is 22.55 kg/m.  Tobacco History  Smoking Status  . Never Smoker  Smokeless Tobacco  . Never Used     Counseling given: Not Answered   Past Medical History:  Diagnosis Date  . Arthritis   . History of chicken pox   . 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 Stroke [facility])    Past Surgical History:  Procedure Laterality Date  . cervical staph infection     2006  . INGUINAL HERNIA REPAIR     kindergarten   Family History  Problem Relation Age of Onset  . Stomach cancer Neg Hx   . Rectal cancer Neg Hx   . Pancreatic cancer Neg Hx   . Colon cancer Neg Hx    History  Sexual Activity  . Sexual activity: Not Currently    Outpatient Encounter Prescriptions as of 09/02/2016  Medication Sig  . ALPRAZolam (XANAX) 1 MG tablet TAKE 1 TABLET BY MOUTH TWICE A DAY AS NEEDED  . atorvastatin (LIPITOR) 20 MG tablet TAKE 1 TABLET (20 MG TOTAL) BY MOUTH DAILY.  . baclofen  (LIORESAL) 10 MG tablet Take 1 tablet (10 mg total) by mouth 3 (three) times daily as needed for muscle spasms.  . clotrimazole-betamethasone (LOTRISONE) cream APPLY TO AFFECTED AREA TWICE A DAY  . HYDROcodone-acetaminophen (NORCO) 10-325 MG per tablet Take 1 tablet by mouth every 8 (eight) hours as needed.   Marland Kitchen ketoconazole (NIZORAL) 2 % shampoo Apply 1 application topically 2 (two) times a week.  . tamsulosin (FLOMAX) 0.4 MG CAPS capsule Take 0.4 mg by mouth daily.  . [DISCONTINUED] sildenafil (VIAGRA) 100 MG tablet Take 0.5-1 tablets (50-100 mg total) by mouth daily as needed for erectile dysfunction. (Patient not taking: Reported on 09/02/2016)  . [DISCONTINUED] tamsulosin (FLOMAX) 0.4 MG CAPS capsule TAKE 1 CAPSULE (0.4 MG TOTAL) BY MOUTH DAILY. (Patient not taking: Reported on 09/02/2016)   No facility-administered encounter medications on file as of 09/02/2016.     Activities of Daily Living In your present state of health, do you have any difficulty performing the following activities: 09/02/2016  Hearing? N  Vision? N  Difficulty concentrating or making decisions? N  Walking or climbing stairs? N  Dressing or bathing? N  Doing errands, shopping? N  Preparing Food and eating ? N  Using the Toilet? N  In the past  six months, have you accidently leaked urine? N  Do you have problems with loss of bowel control? N  Managing your Medications? N  Managing your Finances? N  Housekeeping or managing your Housekeeping? N  Some recent data might be hidden    Patient Care Team: Biagio Borg, MD as PCP - General (Internal Medicine)   Assessment:    Physical assessment deferred to PCP.  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     Goals    . Exercise 150 minutes per week (moderate activity)          Will discuss PT with doctor and plan in place to improve issues 1. Getting up from the floor; ( wood floor) 2. Bathroom safety 3. Right ankle strength; gives away; to review for ? Brace  4. Bending over to tie shoe      . To as active and independent for as long as possible          Continue to be as active as possible and as healthy as possible. Enjoy life, family and church.      Fall Risk Fall Risk  09/02/2016 06/21/2016 12/23/2015 07/14/2015 06/24/2015  Falls in the past year? Yes No No Yes Yes  Number falls in past yr: 1 - - 2 or more 1  Injury with Fall? - - - - No  Risk for fall due to : Impaired mobility - - Impaired balance/gait -  Follow up Falls prevention discussed - - Education provided -   Depression Screen PHQ 2/9 Scores 09/02/2016 06/21/2016 12/23/2015 07/14/2015  PHQ - 2 Score 0 0 0 0    Cognitive Function       Ad8 score reviewed for issues:  Issues making decisions: no  Less interest in hobbies / activities: no  Repeats questions, stories (family complaining): no  Trouble using ordinary gadgets (microwave, computer, phone):no  Forgets the month or year: no  Mismanaging finances: no  Remembering appts: no  Daily problems with thinking and/or memory: no Ad8 score is= 0  Immunization History  Administered Date(s) Administered  . Influenza,inj,Quad PF,36+ Mos 02/04/2014, 01/20/2015, 12/23/2015  . Influenza-Unspecified 03/29/2011  . Td 12/23/2015   Screening Tests Health Maintenance  Topic Date Due  . INFLUENZA VACCINE  10/26/2016  . COLONOSCOPY  09/25/2018  . TETANUS/TDAP  12/22/2025  .  Hepatitis C Screening  Completed  . HIV Screening  Completed      Plan:    Continue to eat heart healthy diet (full of fruits, vegetables, whole grains, lean protein, water--limit salt, fat, and sugar intake) and increase physical activity as tolerated.  Continue doing brain stimulating activities (puzzles, reading, adult coloring books, staying active) to keep memory sharp.   I have personally reviewed and noted the following in the patient's chart:   . Medical and social history . Use of alcohol, tobacco or illicit drugs  . Current medications and supplements . Functional ability and status . Nutritional status . Physical activity . Advanced directives .  List of other physicians . Vitals . Screenings to include cognitive, depression, and falls . Referrals and appointments  In addition, I have reviewed and discussed with patient certain preventive protocols, quality metrics, and best practice recommendations. A written personalized care plan for preventive services as well as general preventive health recommendations were provided to patient.     Michiel Cowboy, RN  09/02/2016  Medical screening examination/treatment/procedure(s) were performed by non-physician practitioner and as supervising physician I was immediately available for consultation/collaboration. I agree with above. Binnie Rail, MD

## 2016-09-01 NOTE — Progress Notes (Signed)
Pre visit review using our clinic review tool, if applicable. No additional management support is needed unless otherwise documented below in the visit note. 

## 2016-09-02 ENCOUNTER — Ambulatory Visit (INDEPENDENT_AMBULATORY_CARE_PROVIDER_SITE_OTHER): Payer: Medicare Other | Admitting: *Deleted

## 2016-09-02 VITALS — BP 126/54 | HR 65 | Resp 20 | Ht 67.0 in | Wt 144.0 lb

## 2016-09-02 DIAGNOSIS — Z0000 Encounter for general adult medical examination without abnormal findings: Secondary | ICD-10-CM | POA: Diagnosis not present

## 2016-09-02 NOTE — Patient Instructions (Signed)
[facility]  [address], [place], [place] [address] Hours: Open ? Closes 5PM Phone: [phone] ext. [phone]  Continue to eat heart healthy diet (full of fruits, vegetables, whole grains, lean protein, water--limit salt, fat, and sugar intake) and increase physical activity as tolerated.  Continue doing brain stimulating activities (puzzles, reading, adult coloring books, staying active) to keep memory sharp.    Danny Hopkins , Thank you for taking time to come for your Medicare Wellness Visit. I appreciate your ongoing commitment to your health goals. Please review the following plan we discussed and let me know if I can assist you in the future.   These are the goals we discussed: Goals    . Exercise 150 minutes per week (moderate activity)          Will discuss PT with doctor and plan in place to improve issues 1. Getting up from the floor; ( wood floor) 2. Bathroom safety 3. Right ankle strength; gives away; to review for ? Brace  4. Bending over to tie shoe      . To as active and independent for as long as possible          Continue to be as active as possible and as healthy as possible. Enjoy life, family and church.       This is a list of the screening recommended for you and due dates:  Health Maintenance  Topic Date Due  . Flu Shot  10/26/2016  . Colon Cancer Screening  09/25/2018  . Tetanus Vaccine  12/22/2025  .  Hepatitis C: One time screening is recommended by Center for Disease Control  (CDC) for  adults born from [birth year] through 1963.   Completed  . HIV Screening  Completed

## 2016-09-05 ENCOUNTER — Other Ambulatory Visit: Payer: Self-pay | Admitting: Internal Medicine

## 2016-10-31 ENCOUNTER — Other Ambulatory Visit: Payer: Self-pay | Admitting: Internal Medicine

## 2016-10-31 NOTE — Telephone Encounter (Signed)
Routing to dr john, please advise, thanks 

## 2016-11-01 NOTE — Telephone Encounter (Signed)
Done hardcopy to [place]  

## 2016-12-06 DIAGNOSIS — M4712 Other spondylosis with myelopathy, cervical region: Secondary | ICD-10-CM | POA: Diagnosis not present

## 2016-12-09 ENCOUNTER — Other Ambulatory Visit: Payer: Self-pay | Admitting: Internal Medicine

## 2016-12-09 DIAGNOSIS — L309 Dermatitis, unspecified: Secondary | ICD-10-CM

## 2016-12-13 DIAGNOSIS — Z23 Encounter for immunization: Secondary | ICD-10-CM | POA: Diagnosis not present

## 2017-01-27 ENCOUNTER — Other Ambulatory Visit: Payer: Self-pay | Admitting: Internal Medicine

## 2017-01-27 NOTE — Telephone Encounter (Signed)
faxed

## 2017-01-27 NOTE — Telephone Encounter (Signed)
Done hardcopy to [place]  

## 2017-03-25 ENCOUNTER — Other Ambulatory Visit: Payer: Self-pay | Admitting: Internal Medicine

## 2017-04-11 DIAGNOSIS — M4712 Other spondylosis with myelopathy, cervical region: Secondary | ICD-10-CM | POA: Diagnosis not present

## 2017-04-26 ENCOUNTER — Other Ambulatory Visit: Payer: Self-pay | Admitting: Internal Medicine

## 2017-04-26 NOTE — Telephone Encounter (Signed)
Done erx 

## 2017-06-03 ENCOUNTER — Other Ambulatory Visit: Payer: Self-pay | Admitting: Internal Medicine

## 2017-06-22 ENCOUNTER — Other Ambulatory Visit (INDEPENDENT_AMBULATORY_CARE_PROVIDER_SITE_OTHER): Payer: Medicare Other

## 2017-06-22 ENCOUNTER — Ambulatory Visit (INDEPENDENT_AMBULATORY_CARE_PROVIDER_SITE_OTHER): Payer: Medicare Other | Admitting: Internal Medicine

## 2017-06-22 ENCOUNTER — Encounter: Payer: Self-pay | Admitting: Internal Medicine

## 2017-06-22 VITALS — BP 116/76 | HR 44 | Temp 97.6°F | Ht 67.0 in | Wt 145.0 lb

## 2017-06-22 DIAGNOSIS — E785 Hyperlipidemia, unspecified: Secondary | ICD-10-CM

## 2017-06-22 DIAGNOSIS — R001 Bradycardia, unspecified: Secondary | ICD-10-CM | POA: Diagnosis not present

## 2017-06-22 DIAGNOSIS — F411 Generalized anxiety disorder: Secondary | ICD-10-CM | POA: Diagnosis not present

## 2017-06-22 DIAGNOSIS — R9431 Abnormal electrocardiogram [ECG] [EKG]: Secondary | ICD-10-CM | POA: Insufficient documentation

## 2017-06-22 DIAGNOSIS — N320 Bladder-neck obstruction: Secondary | ICD-10-CM | POA: Diagnosis not present

## 2017-06-22 DIAGNOSIS — G894 Chronic pain syndrome: Secondary | ICD-10-CM | POA: Diagnosis not present

## 2017-06-22 DIAGNOSIS — F17200 Nicotine dependence, unspecified, uncomplicated: Secondary | ICD-10-CM | POA: Diagnosis not present

## 2017-06-22 LAB — CBC WITH DIFFERENTIAL/PLATELET
BASOS ABS: 0 10*3/uL (ref 0.0–0.1)
Basophils Relative: 0.6 % (ref 0.0–3.0)
Eosinophils Absolute: 0.1 10*3/uL (ref 0.0–0.7)
Eosinophils Relative: 2.2 % (ref 0.0–5.0)
HEMATOCRIT: 50.2 % (ref 39.0–52.0)
HEMOGLOBIN: 17.2 g/dL — AB (ref 13.0–17.0)
LYMPHS PCT: 40.5 % (ref 12.0–46.0)
Lymphs Abs: 2.2 10*3/uL (ref 0.7–4.0)
MCHC: 34.4 g/dL (ref 30.0–36.0)
MCV: 91.8 fl (ref 78.0–100.0)
MONOS PCT: 14.8 % — AB (ref 3.0–12.0)
Monocytes Absolute: 0.8 10*3/uL (ref 0.1–1.0)
Neutro Abs: 2.3 10*3/uL (ref 1.4–7.7)
Neutrophils Relative %: 41.9 % — ABNORMAL LOW (ref 43.0–77.0)
Platelets: 171 10*3/uL (ref 150.0–400.0)
RBC: 5.46 Mil/uL (ref 4.22–5.81)
RDW: 14.3 % (ref 11.5–15.5)
WBC: 5.4 10*3/uL (ref 4.0–10.5)

## 2017-06-22 LAB — URINALYSIS, ROUTINE W REFLEX MICROSCOPIC
BILIRUBIN URINE: NEGATIVE
Hgb urine dipstick: NEGATIVE
Ketones, ur: NEGATIVE
LEUKOCYTES UA: NEGATIVE
Nitrite: NEGATIVE
RBC / HPF: NONE SEEN (ref 0–?)
Specific Gravity, Urine: >=1.03 — AB (ref 1.000–1.030)
Total Protein, Urine: NEGATIVE
URINE GLUCOSE: NEGATIVE
UROBILINOGEN UA: 0.2 (ref 0.0–1.0)
WBC UA: NONE SEEN (ref 0–?)
pH: 5.5 (ref 5.0–8.0)

## 2017-06-22 LAB — BASIC METABOLIC PANEL
BUN: 17 mg/dL (ref 6–23)
CHLORIDE: 104 meq/L (ref 96–112)
CO2: 29 meq/L (ref 19–32)
CREATININE: 0.92 mg/dL (ref 0.40–1.50)
Calcium: 9.5 mg/dL (ref 8.4–10.5)
GFR: 109.82 mL/min (ref >60.00)
GLUCOSE: 97 mg/dL (ref 70–99)
Potassium: 4.4 mEq/L (ref 3.5–5.1)
Sodium: 139 mEq/L (ref 135–145)

## 2017-06-22 LAB — LIPID PANEL
CHOL/HDL RATIO: 2
CHOLESTEROL: 133 mg/dL (ref 0–200)
HDL: 55.9 mg/dL (ref >39.00)
LDL CALC: 67 mg/dL (ref 0–99)
NONHDL: 77.16
Triglycerides: 52 mg/dL (ref 0.0–149.0)
VLDL: 10.4 mg/dL (ref 0.0–40.0)

## 2017-06-22 LAB — HEPATIC FUNCTION PANEL
ALT: 17 U/L (ref 0–53)
AST: 17 U/L (ref 0–37)
Albumin: 4.3 g/dL (ref 3.5–5.2)
Alkaline Phosphatase: 63 U/L (ref 39–117)
BILIRUBIN DIRECT: 0.3 mg/dL (ref 0.0–0.3)
BILIRUBIN TOTAL: 1.6 mg/dL — AB (ref 0.2–1.2)
Total Protein: 6.9 g/dL (ref 6.0–8.3)

## 2017-06-22 LAB — TSH: TSH: 1.49 u[IU]/mL (ref 0.35–4.50)

## 2017-06-22 LAB — PSA: PSA: 0.39 ng/mL (ref 0.10–4.00)

## 2017-06-22 MED ORDER — TAMSULOSIN HCL 0.4 MG PO CAPS: 0.4000 mg | ORAL_CAPSULE | Freq: Every day | ORAL | 3 refills | Status: DC

## 2017-06-22 MED ORDER — ALPRAZOLAM 1 MG PO TABS: 1.0000 mg | ORAL_TABLET | Freq: Two times a day (BID) | ORAL | 5 refills | Status: DC | PRN

## 2017-06-22 MED ORDER — ATORVASTATIN CALCIUM 20 MG PO TABS: ORAL_TABLET | ORAL | 3 refills | Status: DC

## 2017-06-22 NOTE — Progress Notes (Signed)
Subjective:    Patient ID: Danny Hopkins, male    DOB: January 27, 1961, 57 y.o.   MRN: [id number]  HPI   Here for yearly f/u;  Overall doing ok;  Pt denies Chest pain, worsening SOB, DOE, wheezing, orthopnea, PND, worsening LE edema, palpitations, dizziness or syncope.  Pt denies neurological change such as new headache, facial or extremity weakness.  Pt denies polydipsia, polyuria, or low sugar symptoms. Pt states overall good compliance with treatment and medications, good tolerability, and has been trying to follow appropriate diet.  Pt denies worsening depressive symptoms, suicidal ideation or panic. No fever, night sweats, wt loss, loss of appetite, or other constitutional symptoms.  Pt states good ability with ADL's, has low fall risk, home safety reviewed and adequate, no other significant changes in hearing or vision, and only occasionally active with exercise. Has chronic neck pain and gait d/o releated to stroke, gets quite "stiff all over" when sits more than 30 min.  Does have several wks ongoing nasal allergy symptoms with clearish congestion, itch and sneezing, without fever, pain, ST, cough, swelling or wheezing.   Pt continues to have recurring LBP without change in severity, bowel or bladder change, fever, wt loss,  worsening LE pain/numbness/weakness, gait change or falls, gets hydrocodon prn per Dr Poole/NS  Pt is unaware of any low HR today or recent Past Medical History:  Diagnosis Date  . Arthritis   . History of chicken pox   . 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 Stroke [facility])    Past Surgical History:  Procedure Laterality Date  . cervical staph infection     2006  . INGUINAL HERNIA REPAIR     kindergarten    reports that he has never smoked. He has never used smokeless tobacco. He reports that he drinks about 1.2 oz of alcohol per week. He reports that he has current or past drug history. Drug: Marijuana. family history is not on file. No Known  Allergies Current Outpatient Medications on File Prior to Visit  Medication Sig Dispense Refill  . baclofen (LIORESAL) 10 MG tablet Take 1 tablet (10 mg total) by mouth 3 (three) times daily as needed for muscle spasms. 90 each 1  . clotrimazole-betamethasone (LOTRISONE) cream APPLY TO AFFECTED AREA TWICE A DAY 30 g 2  . HYDROcodone-acetaminophen (NORCO) 10-325 MG per tablet Take 1 tablet by mouth every 8 (eight) hours as needed.     Marland Kitchen ketoconazole (NIZORAL) 2 % shampoo Apply 1 application topically 2 (two) times a week. 120 mL 3   No current facility-administered medications on file prior to visit.    Review of Systems Constitutional: Negative for other unusual diaphoresis, sweats, appetite or weight changes HENT: Negative for other worsening hearing loss, ear pain, facial swelling, mouth sores or neck stiffness.   Eyes: Negative for other worsening pain, redness or other visual disturbance.  Respiratory: Negative for other stridor or swelling Cardiovascular: Negative for other palpitations or other chest pain  Gastrointestinal: Negative for worsening diarrhea or loose stools, blood in stool, distention or other pain Genitourinary: Negative for hematuria, flank pain or other change in urine volume.  Musculoskeletal: Negative for myalgias or other joint swelling.  Skin: Negative for other color change, or other wound or worsening drainage.  Neurological: Negative for other syncope or numbness. Hematological: Negative for other adenopathy or swelling Psychiatric/Behavioral: Negative for hallucinations, other worsening agitation, SI, self-injury, or new decreased concentration All other system neg per pt  Objective:   Physical Exam BP 116/76   Pulse (!) 44   Temp 97.6 F (36.4 C) (Oral)   Ht 5\' 7"  (1.702 m)   Wt 145 lb (65.8 kg)   SpO2 99%   BMI 22.71 kg/m  VS noted, not ill appearing Constitutional: Pt is oriented to person, place, and time. Appears well-developed and  well-nourished, in no significant distress and comfortable Head: Normocephalic and atraumatic  Eyes: Conjunctivae and EOM are normal. Pupils are equal, round, and reactive to light Right Ear: External ear normal without discharge Left Ear: External ear normal without discharge Nose: Nose without discharge or deformity Mouth/Throat: Oropharynx is without other ulcerations and moist  Neck: Normal range of motion. Neck supple. No JVD present. No tracheal deviation present or significant neck LA or mass Cardiovascular: Slow rate, regular rhythm, normal heart sounds and intact distal pulses.  Pulmonary/Chest: WOB normal and breath sounds without rales or wheezing  Abdominal: Soft. Bowel sounds are normal. NT. No HSM  Musculoskeletal: Normal range of motion. Exhibits no edema Lymphadenopathy: Has no other cervical adenopathy.  Neurological: Pt is alert and oriented to person, place, and time. Pt has normal reflexes. No cranial nerve deficit. Motor grossly intact, Gait intact Skin: Skin is warm and dry. No rash noted or new ulcerations Psychiatric:  Has mild nervous mood and affect. Behavior is normal without agitation No other exam findings  Lab Results  Component Value Date   WBC 5.9 06/21/2016   HGB 17.4 (H) 06/21/2016   HCT 51.9 06/21/2016   PLT 194.0 06/21/2016   GLUCOSE 92 06/21/2016   CHOL 141 06/21/2016   TRIG 72.0 06/21/2016   HDL 50.50 06/21/2016   LDLCALC 76 06/21/2016   ALT 13 06/21/2016   AST 15 06/21/2016   NA 140 06/21/2016   K 4.2 06/21/2016   CL 104 06/21/2016   CREATININE 1.00 06/21/2016   BUN 13 06/21/2016   CO2 30 06/21/2016   TSH 2.23 06/21/2016   PSA 0.33 06/21/2016   ECG today I have personally interpreted: Sinus bradycardia 44 with RV enlargment    Assessment & Plan:

## 2017-06-22 NOTE — Patient Instructions (Addendum)
Your EKG did show the low heart rate today and possible some enlargement of the right side of the heart  You will be contacted regarding the referral for: cardiology (heart doctor), and echocardiogram (heart ultrasound)  Please continue all other medications as before, and refills have been done if requested.  Please have the pharmacy call with any other refills you may need.  Please continue your efforts at being more active, low cholesterol diet, and weight control.  You are otherwise up to date with prevention measures today.  Please keep your appointments with your specialists as you may have planned  Please go to the LAB in the Basement (turn left off the elevator) for the tests to be done today  You will be contacted by phone if any changes need to be made immediately.  Otherwise, you will receive a letter about your results with an explanation, but please check with MyChart first.  Please remember to sign up for MyChart if you have not done so, as this will be important to you in the future with finding out test results, communicating by private email, and scheduling acute appointments online when needed.  Please return in 1 year for your yearly visit, or sooner if needed

## 2017-06-22 NOTE — Assessment & Plan Note (Signed)
Down to 1 pack per year per pt, encouraged to quit completely

## 2017-06-25 NOTE — Assessment & Plan Note (Signed)
Also for psa as he is due, asympt,  to f/u any worsening symptoms or concerns

## 2017-06-25 NOTE — Assessment & Plan Note (Signed)
Also with ? RV enlargement, for echo as well

## 2017-06-25 NOTE — Assessment & Plan Note (Signed)
Markedly abnormal but tolerates well and volume stable, for cardiology referral

## 2017-06-25 NOTE — Assessment & Plan Note (Signed)
Stable, cont same tx 

## 2017-06-25 NOTE — Assessment & Plan Note (Addendum)
Lab Results  Component Value Date   LDLCALC 67 06/22/2017  stable overall by history and exam, recent data reviewed with pt, and pt to continue medical treatment as before,  to f/u any worsening symptoms or concerns  Note:  Total time for pt hx, exam, review of record with pt in the room, determination of diagnoses and plan for further eval and tx is > 40 min, with over 50% spent in coordination and counseling of patient including the differential dx, tx, further evaluation and other management of HLD, anxiety, chronic pain, smoking, bradycardia and abnormal ECG

## 2017-06-25 NOTE — Assessment & Plan Note (Signed)
Stable, to cont same tx 

## 2017-06-28 ENCOUNTER — Telehealth (HOSPITAL_COMMUNITY): Payer: Self-pay | Admitting: Internal Medicine

## 2017-06-29 NOTE — Telephone Encounter (Signed)
User: Cherie Dark A Date/time: 06/28/17 3:20 PM  Comment: Called pt and lmsg for him to CB to get sch for an echo.Vassie Moment  Context:  Outcome: Left Message  Phone number: [phone] Phone Type: Home Phone  Comm. type: Telephone Call type: Outgoing  Contact: Lennox Laity Relation to patient: Self

## 2017-06-30 ENCOUNTER — Ambulatory Visit (HOSPITAL_COMMUNITY): Payer: Medicare Other | Attending: Cardiovascular Disease

## 2017-06-30 ENCOUNTER — Encounter: Payer: Self-pay | Admitting: Internal Medicine

## 2017-06-30 ENCOUNTER — Other Ambulatory Visit: Payer: Self-pay

## 2017-06-30 DIAGNOSIS — R001 Bradycardia, unspecified: Secondary | ICD-10-CM

## 2017-06-30 DIAGNOSIS — R9431 Abnormal electrocardiogram [ECG] [EKG]: Secondary | ICD-10-CM | POA: Diagnosis not present

## 2017-07-03 ENCOUNTER — Ambulatory Visit (INDEPENDENT_AMBULATORY_CARE_PROVIDER_SITE_OTHER): Payer: Medicare Other | Admitting: Cardiology

## 2017-07-03 ENCOUNTER — Encounter: Payer: Self-pay | Admitting: Cardiology

## 2017-07-03 VITALS — BP 94/64 | HR 63 | Ht 67.0 in | Wt 144.8 lb

## 2017-07-03 DIAGNOSIS — R001 Bradycardia, unspecified: Secondary | ICD-10-CM

## 2017-07-03 DIAGNOSIS — R9431 Abnormal electrocardiogram [ECG] [EKG]: Secondary | ICD-10-CM

## 2017-07-03 DIAGNOSIS — E782 Mixed hyperlipidemia: Secondary | ICD-10-CM

## 2017-07-03 NOTE — Progress Notes (Signed)
Cardiology Office Note    Date:  07/03/2017   ID:  Danny Hopkins, DOB 1960-09-18, MRN [id number]  PCP:  Biagio Borg, MD  Cardiologist:  Ena Dawley, MD  Referring physician: dr. Cathlean Cower  Reason for visit: bradycardia  History of Present Illness:  Danny Hopkins is a 57 y.o. male with prior medical history of hyperlipidemia, stroke, with residual gait problem, despite that patient able to fully work and completely independent. The patient presented to his primary care physician on March 28 and was found to be in sinus bradycardia. The patient states that he has no symptoms of dizziness and presyncope or syncope, he denies any chest pain dyspnea on exertion, no lower extremity edema claudications. He tolerates atorvastatin well.  Past Medical History:  Diagnosis Date  . Arthritis   . History of chicken pox   . 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 Stroke [facility])     Past Surgical History:  Procedure Laterality Date  . cervical staph infection     2006  . INGUINAL HERNIA REPAIR     kindergarten    Current Medications: Outpatient Medications Prior to Visit  Medication Sig Dispense Refill  . ALPRAZolam (XANAX) 1 MG tablet Take 1 tablet (1 mg total) by mouth 2 (two) times daily as needed. 60 tablet 5  . atorvastatin (LIPITOR) 20 MG tablet TAKE 1 TABLET (20 MG TOTAL) BY MOUTH DAILY. 90 tablet 3  . baclofen (LIORESAL) 10 MG tablet Take 1 tablet (10 mg total) by mouth 3 (three) times daily as needed for muscle spasms. 90 each 1  . clotrimazole-betamethasone (LOTRISONE) cream APPLY TO AFFECTED AREA TWICE A DAY 30 g 2  . HYDROcodone-acetaminophen (NORCO) 10-325 MG per tablet Take 1 tablet by mouth every 8 (eight) hours as needed.     Marland Kitchen ketoconazole (NIZORAL) 2 % shampoo Apply 1 application topically 2 (two) times a week. 120 mL 3  . tamsulosin (FLOMAX) 0.4 MG CAPS capsule Take 1 capsule (0.4 mg total) by mouth daily. 90 capsule 3   No  facility-administered medications prior to visit.      Allergies:   Patient has no known allergies.   Social History   Socioeconomic History  . Marital status: Single    Spouse name: Not on file  . Number of children: Not on file  . Years of education: 24  . Highest education level: Not on file  Occupational History  . Occupation: DISABILITY    Employer: UNEMPLOYED  Social Needs  . Financial resource strain: Not on file  . Food insecurity:    Worry: Not on file    Inability: Not on file  . Transportation needs:    Medical: Not on file    Non-medical: Not on file  Tobacco Use  . Smoking status: Never Smoker  . Smokeless tobacco: Never Used  Substance and Sexual Activity  . Alcohol use: Yes    Alcohol/week: 1.2 oz    Types: 2 Cans of beer per week  . Drug use: Yes    Types: Marijuana  . Sexual activity: Not Currently  Lifestyle  . Physical activity:    Days per week: Not on file    Minutes per session: Not on file  . Stress: Not on file  Relationships  . Social connections:    Talks on phone: Not on file    Gets together: Not on file    Attends religious service: Not on file    Active  member of club or organization: Not on file    Attends meetings of clubs or organizations: Not on file    Relationship status: Not on file  Other Topics Concern  . Not on file  Social History Narrative   Regular exercise-no   Caffeine Use-no           Family History:  There is no family history of coronary artery disease or CHF.  ROS:   Please see the history of present illness.    ROS All other systems reviewed and are negative.   PHYSICAL EXAM:   VS:  BP 94/64   Pulse 63   Ht 5\' 7"  (1.702 m)   Wt 144 lb 12.8 oz (65.7 kg)   SpO2 98%   BMI 22.68 kg/m    GEN: Well nourished, well developed, in no acute distress  HEENT: normal  Neck: no JVD, carotid bruits, or masses Cardiac: RRR; no murmurs, rubs, or gallops,no edema  Respiratory:  clear to auscultation  bilaterally, normal work of breathing GI: soft, nontender, nondistended, + BS MS: no deformity or atrophy  Skin: warm and dry, no rash Neuro:  Alert and Oriented x 3, Strength and sensation are intact Psych: euthymic mood, full affect  Wt Readings from Last 3 Encounters:  07/03/17 144 lb 12.8 oz (65.7 kg)  06/22/17 145 lb (65.8 kg)  09/02/16 144 lb (65.3 kg)     Studies/Labs Reviewed:   EKG:  EKG is ordered today.  The ekg ordered today demonstrates sinus rhythm with rightward axis, negative T waves in inferolateral leads,heart rate higher since the la otherwise unchanged, this was personally reviewed.  Recent Labs: 06/22/2017: ALT 17; BUN 17; Creatinine, Ser 0.92; Hemoglobin 17.2; Platelets 171.0; Potassium 4.4; Sodium 139; TSH 1.49   Lipid Panel    Component Value Date/Time   CHOL 133 06/22/2017 1134   TRIG 52.0 06/22/2017 1134   HDL 55.90 06/22/2017 1134   CHOLHDL 2 06/22/2017 1134   VLDL 10.4 06/22/2017 1134   LDLCALC 67 06/22/2017 1134    Additional studies/ records that were reviewed today include:   TTE: 06/30/2017 - Left ventricle: The cavity size was normal. Wall thickness was   increased in a pattern of mild LVH. Systolic function was normal.   The estimated ejection fraction was in the range of 60% to 65%.   Wall motion was normal; there were no regional wall motion   abnormalities. Left ventricular diastolic function parameters   were normal. - Aortic valve: Transvalvular velocity was within the normal range.   There was no stenosis. There was no regurgitation. - Mitral valve: Transvalvular velocity was within the normal range.   There was no evidence for stenosis. There was trivial   regurgitation. - Right ventricle: The cavity size was normal. Wall thickness was   normal. - Atrial septum: No defect or patent foramen ovale was identified   by color flow Doppler. - Tricuspid valve: There was trivial regurgitation. - Pulmonary arteries: Systolic pressure was  within the normal   range. PA peak pressure: 22 mm Hg (S).    ASSESSMENT:    1. Abnormal EKG   2. Bradycardia   3. Mixed hyperlipidemia      PLAN:  In order of problems listed above:  1. Bradycardia - asymptomatic per patient, we will obtain stress test with modified Bruce protocol to evaluate for chronotropic incompetence. 2. Abnormal EKG, with negative T waves in inferolateral leads, this is unchanged from last week however no prior EKG,  we'll obtain nuclear stress test to evaluate for ischemia. 3. Hyperlipidemia, atorvastatin, lipids at goal.  Follow-up as needed.  Medication Adjustments/Labs and Tests Ordered: Current medicines are reviewed at length with the patient today.  Concerns regarding medicines are outlined above.  Medication changes, Labs and Tests ordered today are listed in the Patient Instructions below. Patient Instructions  Medication Instructions:  Your provider recommends that you continue on your current medications as directed. Please refer to the Current Medication list given to you today.    Labwork: None  Testing/Procedures: Dr. Meda Coffee recommends you have a NUCLEAR STRESS TEST.  Follow-Up: Your provider wants you to follow-up in: 1 year with Dr. Meda Coffee. You will receive a reminder letter in the mail two months in advance. If you don't receive a letter, please call our office to schedule the follow-up appointment.    Any Other Special Instructions Will Be Listed Below (If Applicable).     If you need a refill on your cardiac medications before your next appointment, please call your pharmacy.     Signed, Ena Dawley, MD  07/03/2017 3:07 PM    [facility] [place], [place], [place]  [address] Phone: [phone]; Fax: [phone]

## 2017-07-03 NOTE — Patient Instructions (Signed)
Medication Instructions:  Your provider recommends that you continue on your current medications as directed. Please refer to the Current Medication list given to you today.    Labwork: None  Testing/Procedures: Dr. Meda Coffee recommends you have a NUCLEAR STRESS TEST.  Follow-Up: Your provider wants you to follow-up in: 1 year with Dr. Meda Coffee. You will receive a reminder letter in the mail two months in advance. If you don't receive a letter, please call our office to schedule the follow-up appointment.    Any Other Special Instructions Will Be Listed Below (If Applicable).     If you need a refill on your cardiac medications before your next appointment, please call your pharmacy.

## 2017-07-06 ENCOUNTER — Telehealth (HOSPITAL_COMMUNITY): Payer: Self-pay | Admitting: *Deleted

## 2017-07-06 NOTE — Telephone Encounter (Signed)
Patient given detailed instructions per Myocardial Perfusion Study Information Sheet for the test on 07/11/17. Patient notified to arrive 15 minutes early and that it is imperative to arrive on time for appointment to keep from having the test rescheduled.  If you need to cancel or reschedule your appointment, please call the office within 24 hours of your appointment. . Patient verbalized understanding. Kirstie Peri

## 2017-07-11 ENCOUNTER — Encounter (HOSPITAL_COMMUNITY): Payer: Self-pay | Admitting: *Deleted

## 2017-07-11 ENCOUNTER — Telehealth: Payer: Self-pay | Admitting: Internal Medicine

## 2017-07-11 ENCOUNTER — Ambulatory Visit (HOSPITAL_COMMUNITY): Payer: Medicare Other | Attending: Cardiology

## 2017-07-11 ENCOUNTER — Encounter (HOSPITAL_COMMUNITY): Payer: Medicare Other

## 2017-07-11 DIAGNOSIS — R9431 Abnormal electrocardiogram [ECG] [EKG]: Secondary | ICD-10-CM | POA: Insufficient documentation

## 2017-07-11 DIAGNOSIS — R001 Bradycardia, unspecified: Secondary | ICD-10-CM | POA: Diagnosis not present

## 2017-07-11 MED ORDER — TECHNETIUM TC 99M TETROFOSMIN IV KIT
29.6000 | PACK | Freq: Once | INTRAVENOUS | Status: AC | PRN
  Administered 2017-07-11: 29.6 via INTRAVENOUS
  Filled 2017-07-11: qty 30

## 2017-07-11 NOTE — Telephone Encounter (Signed)
Pt would need another appt for evaluation if he is still having issues. Please schedule. Thanks!

## 2017-07-11 NOTE — Telephone Encounter (Signed)
Copied from [place] [id number]. Topic: Quick Communication - See Telephone Encounter >> Jul 11, 2017 12:34 PM Robina Ade, Helene Kelp D wrote: CRM for notification. See Telephone encounter for: 07/11/17. Patient called and said that he was seen last week and his throat was red but no strep. But he has not gotten better and now he sees a white spot on his throat and would like to talk to Dr. Jenny Reichmann or his CMA about this.

## 2017-07-11 NOTE — Telephone Encounter (Signed)
Appt made

## 2017-07-12 ENCOUNTER — Encounter: Payer: Self-pay | Admitting: Internal Medicine

## 2017-07-12 ENCOUNTER — Ambulatory Visit (INDEPENDENT_AMBULATORY_CARE_PROVIDER_SITE_OTHER): Payer: Medicare Other | Admitting: Internal Medicine

## 2017-07-12 ENCOUNTER — Ambulatory Visit (HOSPITAL_COMMUNITY): Payer: Medicare Other | Attending: Cardiovascular Disease

## 2017-07-12 VITALS — BP 104/68 | HR 64 | Temp 98.4°F | Ht 67.0 in | Wt 143.0 lb

## 2017-07-12 DIAGNOSIS — J3501 Chronic tonsillitis: Secondary | ICD-10-CM | POA: Diagnosis not present

## 2017-07-12 DIAGNOSIS — F411 Generalized anxiety disorder: Secondary | ICD-10-CM

## 2017-07-12 DIAGNOSIS — R001 Bradycardia, unspecified: Secondary | ICD-10-CM | POA: Diagnosis not present

## 2017-07-12 DIAGNOSIS — J029 Acute pharyngitis, unspecified: Secondary | ICD-10-CM | POA: Diagnosis not present

## 2017-07-12 DIAGNOSIS — R9431 Abnormal electrocardiogram [ECG] [EKG]: Secondary | ICD-10-CM | POA: Diagnosis not present

## 2017-07-12 DIAGNOSIS — Z8673 Personal history of transient ischemic attack (TIA), and cerebral infarction without residual deficits: Secondary | ICD-10-CM | POA: Insufficient documentation

## 2017-07-12 LAB — MYOCARDIAL PERFUSION IMAGING
LV dias vol: 100 mL (ref 62–150)
LV sys vol: 47 mL
Peak HR: 92 {beats}/min
RATE: 0.25
Rest HR: 60 {beats}/min
SDS: 2
SRS: 1
SSS: 3
TID: 1

## 2017-07-12 MED ORDER — REGADENOSON 0.4 MG/5ML IV SOLN
0.4000 mg | Freq: Once | INTRAVENOUS | Status: AC
  Administered 2017-07-12: 0.4 mg via INTRAVENOUS

## 2017-07-12 MED ORDER — AZITHROMYCIN 250 MG PO TABS: ORAL_TABLET | ORAL | 1 refills | Status: DC

## 2017-07-12 MED ORDER — TECHNETIUM TC 99M TETROFOSMIN IV KIT
33.0000 | PACK | Freq: Once | INTRAVENOUS | Status: AC | PRN
  Administered 2017-07-12: 33 via INTRAVENOUS
  Filled 2017-07-12: qty 33

## 2017-07-12 NOTE — Patient Instructions (Addendum)
Please take all new medication as prescribed - the antibiotic  Please continue all other medications as before, and refills have been done if requested.  Please have the pharmacy call with any other refills you may need.  Please keep your appointments with your specialists as you may have planned  You will be contacted regarding the referral for: ENT

## 2017-07-12 NOTE — Progress Notes (Signed)
Subjective:    Patient ID: Danny Hopkins, male    DOB: 01-Oct-1960, 57 y.o.   MRN: [id number]  HPI    Here to f/u with c/o 2 wks worsening acute on chronic ST with enlarged tonsils and small white spot on right tonsil now much more enlarged, assoc with HA, fever, minor non prod cough but Pt denies chest pain, increased sob or doe, wheezing, orthopnea, PND, increased LE swelling, palpitations, dizziness or syncope.  Denies worsening depressive symptoms, suicidal ideation, or panic; has ongoing anxiety Past Medical History:  Diagnosis Date  . Arthritis   . History of chicken pox   . 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 Stroke [facility])    Past Surgical History:  Procedure Laterality Date  . cervical staph infection     2006  . INGUINAL HERNIA REPAIR     kindergarten    reports that he has never smoked. He has never used smokeless tobacco. He reports that he drinks about 1.2 oz of alcohol per week. He reports that he has current or past drug history. Drug: Marijuana. family history is not on file. No Known Allergies Current Outpatient Medications on File Prior to Visit  Medication Sig Dispense Refill  . ALPRAZolam (XANAX) 1 MG tablet Take 1 tablet (1 mg total) by mouth 2 (two) times daily as needed. 60 tablet 5  . atorvastatin (LIPITOR) 20 MG tablet TAKE 1 TABLET (20 MG TOTAL) BY MOUTH DAILY. 90 tablet 3  . baclofen (LIORESAL) 10 MG tablet Take 1 tablet (10 mg total) by mouth 3 (three) times daily as needed for muscle spasms. 90 each 1  . clotrimazole-betamethasone (LOTRISONE) cream APPLY TO AFFECTED AREA TWICE A DAY 30 g 2  . HYDROcodone-acetaminophen (NORCO) 10-325 MG per tablet Take 1 tablet by mouth every 8 (eight) hours as needed.     Marland Kitchen ketoconazole (NIZORAL) 2 % shampoo Apply 1 application topically 2 (two) times a week. 120 mL 3  . tamsulosin (FLOMAX) 0.4 MG CAPS capsule Take 1 capsule (0.4 mg total) by mouth daily. 90 capsule 3   No current  facility-administered medications on file prior to visit.    Review of Systems  Constitutional: Negative for other unusual diaphoresis or sweats HENT: Negative for ear discharge or swelling Eyes: Negative for other worsening visual disturbances Respiratory: Negative for stridor or other swelling  Gastrointestinal: Negative for worsening distension or other blood Genitourinary: Negative for retention or other urinary change Musculoskeletal: Negative for other MSK pain or swelling Skin: Negative for color change or other new lesions Neurological: Negative for worsening tremors and other numbness  Psychiatric/Behavioral: Negative for worsening agitation or other fatigue \\All  other system neg per pt    Objective:   Physical Exam BP 104/68   Pulse 64   Temp 98.4 F (36.9 C) (Oral)   Ht 5\' 7"  (1.702 m)   Wt 143 lb (64.9 kg)   SpO2 97%   BMI 22.40 kg/m  VS noted,  Constitutional: Pt appears in NAD HENT: Head: NCAT.  Right Ear: External ear normal.  Left Ear: External ear normal.  Eyes: . Pupils are equal, round, and reactive to light. Conjunctivae and EOM are normal Bilat tm's with mild erythema.  Max sinus areas non tender.  Pharynx with severe erythema, no exudate but has large white area right tonsil crypt without frank abscess or drainage Nose: without d/c or deformity Neck: Neck supple. Gross normal ROM Cardiovascular: Normal rate and regular rhythm.  Pulmonary/Chest: Effort normal and breath sounds without rales or wheezing.  Neurological: Pt is alert. At baseline orientation, motor grossly intact Skin: Skin is warm. No rashes, other new lesions, no LE edema Psychiatric: Pt behavior is normal without agitation  No other exam findings    Assessment & Plan:

## 2017-07-14 DIAGNOSIS — J029 Acute pharyngitis, unspecified: Secondary | ICD-10-CM | POA: Insufficient documentation

## 2017-07-14 NOTE — Assessment & Plan Note (Signed)
Mild to mod, for antibx course,  to f/u any worsening symptoms or concerns 

## 2017-07-14 NOTE — Assessment & Plan Note (Signed)
stable overall by history and exam, nand pt to continue medical treatment as before,  to f/u any worsening symptoms or concerns

## 2017-07-14 NOTE — Assessment & Plan Note (Signed)
D/w pt, needs to see ENT for further management

## 2017-07-19 DIAGNOSIS — J3501 Chronic tonsillitis: Secondary | ICD-10-CM | POA: Diagnosis not present

## 2017-09-12 ENCOUNTER — Ambulatory Visit (INDEPENDENT_AMBULATORY_CARE_PROVIDER_SITE_OTHER): Payer: Medicare Other | Admitting: *Deleted

## 2017-09-12 VITALS — BP 122/64 | HR 58 | Resp 18 | Ht 67.0 in | Wt 144.0 lb

## 2017-09-12 DIAGNOSIS — Z8673 Personal history of transient ischemic attack (TIA), and cerebral infarction without residual deficits: Secondary | ICD-10-CM

## 2017-09-12 DIAGNOSIS — R269 Unspecified abnormalities of gait and mobility: Secondary | ICD-10-CM

## 2017-09-12 DIAGNOSIS — Z0000 Encounter for general adult medical examination without abnormal findings: Secondary | ICD-10-CM

## 2017-09-12 NOTE — Progress Notes (Addendum)
Subjective:   Danny Hopkins is a 57 y.o. male who presents for Medicare Annual/Subsequent preventive examination.  Review of Systems:  No ROS.  Medicare Wellness Visit. Additional risk factors are reflected in the social history.  Cardiac Risk Factors include: advanced age (>1men, >37 women);dyslipidemia;male gender Sleep patterns: feels rested on waking, gets up 1 times nightly to void and sleeps 7 hours nightly.    Home Safety/Smoke Alarms: Feels safe in home. Smoke alarms in place.  Living environment; residence and Firearm Safety: apartment, no firearms. Lives alone,  good support system Seat Belt Safety/Bike Helmet: Wears seat belt.   PSA-  Lab Results  Component Value Date   PSA 0.39 06/22/2017   PSA 0.33 06/21/2016   PSA 0.36 06/24/2015       Objective:    Vitals: BP 122/64   Pulse (!) 58   Resp 18   Ht 5\' 7"  (1.702 m)   Wt 144 lb (65.3 kg)   SpO2 98%   BMI 22.55 kg/m   Body mass index is 22.55 kg/m.  Advanced Directives 09/12/2017 09/02/2016 09/02/2016 10/02/2015 08/28/2015 07/14/2015 09/10/2013  Does Patient Have a Medical Advance Directive? No (No Data) No No No No Patient does not have advance directive  Does patient want to make changes to medical advance directive? Yes (ED - Information included in AVS) - - - - - -  Would patient like information on creating a medical advance directive? - - Yes (ED - Information included in AVS) No - patient declined information - Yes - Educational materials given -  Pre-existing out of facility DNR order (yellow form or pink MOST form) - - - - - - No    Tobacco Social History   Tobacco Use  Smoking Status Never Smoker  Smokeless Tobacco Never Used     Counseling given: Not Answered  Past Medical History:  Diagnosis Date  . Arthritis   . History of chicken pox   . 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 Stroke [facility])    Past Surgical History:  Procedure Laterality Date  . cervical staph  infection     2006  . INGUINAL HERNIA REPAIR     kindergarten   Family History  Problem Relation Age of Onset  . Stomach cancer Neg Hx   . Rectal cancer Neg Hx   . Pancreatic cancer Neg Hx   . Colon cancer Neg Hx    Social History   Socioeconomic History  . Marital status: Single    Spouse name: Not on file  . Number of children: Not on file  . Years of education: 102  . Highest education level: Not on file  Occupational History  . Occupation: DISABILITY    Employer: UNEMPLOYED  Social Needs  . Financial resource strain: Not hard at all  . Food insecurity:    Worry: Never true    Inability: Never true  . Transportation needs:    Medical: No    Non-medical: No  Tobacco Use  . Smoking status: Never Smoker  . Smokeless tobacco: Never Used  Substance and Sexual Activity  . Alcohol use: Yes    Alcohol/week: 1.2 oz    Types: 2 Cans of beer per week    Comment: 6 can of beer weekly  . Drug use: Yes    Types: Marijuana  . Sexual activity: Not Currently  Lifestyle  . Physical activity:    Days per week: 6 days    Minutes  per session: 60 min  . Stress: Only a little  Relationships  . Social connections:    Talks on phone: More than three times a week    Gets together: More than three times a week    Attends religious service: More than 4 times per year    Active member of club or organization: Yes    Attends meetings of clubs or organizations: More than 4 times per year    Relationship status: Not on file  Other Topics Concern  . Not on file  Social History Narrative   Regular exercise-no   Caffeine Use-no          Outpatient Encounter Medications as of 09/12/2017  Medication Sig  . ALPRAZolam (XANAX) 1 MG tablet Take 1 tablet (1 mg total) by mouth 2 (two) times daily as needed.  Marland Kitchen atorvastatin (LIPITOR) 20 MG tablet TAKE 1 TABLET (20 MG TOTAL) BY MOUTH DAILY.  . baclofen (LIORESAL) 10 MG tablet Take 1 tablet (10 mg total) by mouth 3 (three) times daily as  needed for muscle spasms.  . clotrimazole-betamethasone (LOTRISONE) cream APPLY TO AFFECTED AREA TWICE A DAY  . docusate sodium (COLACE) 100 MG capsule Take 100 mg by mouth as needed for mild constipation.  Marland Kitchen HYDROcodone-acetaminophen (NORCO) 10-325 MG per tablet Take 1 tablet by mouth every 8 (eight) hours as needed.   Marland Kitchen ketoconazole (NIZORAL) 2 % shampoo Apply 1 application topically 2 (two) times a week.  . tamsulosin (FLOMAX) 0.4 MG CAPS capsule Take 1 capsule (0.4 mg total) by mouth daily.  . [DISCONTINUED] azithromycin (ZITHROMAX Z-PAK) 250 MG tablet 2 tab by mouth day 1, then 1 per day (Patient not taking: Reported on 09/12/2017)   No facility-administered encounter medications on file as of 09/12/2017.     Activities of Daily Living In your present state of health, do you have any difficulty performing the following activities: 09/12/2017  Hearing? N  Vision? N  Difficulty concentrating or making decisions? N  Walking or climbing stairs? N  Dressing or bathing? N  Doing errands, shopping? N  Preparing Food and eating ? N  Using the Toilet? N  In the past six months, have you accidently leaked urine? N  Do you have problems with loss of bowel control? N  Managing your Medications? N  Managing your Finances? N  Housekeeping or managing your Housekeeping? N  Some recent data might be hidden    Patient Care Team: Biagio Borg, MD as PCP - General (Internal Medicine)   Assessment:   This is a routine wellness examination for [name]. Physical assessment deferred to PCP.   Exercise Activities and Dietary recommendations Current Exercise Habits: Home exercise routine, Type of exercise: walking(mows yards, washes cars), Time (Minutes): 50, Frequency (Times/Week): 6, Weekly Exercise (Minutes/Week): 300, Intensity: Mild, Exercise limited by: neurologic condition(s)(left sided residual from CVA)  Diet (meal preparation, eat out, water intake, caffeinated beverages, dairy products,  fruits and vegetables): in general, a "healthy" diet  , well balanced   Reviewed heart healthy diet, Encouraged patient to increase daily water and fluid intake.   Goals    . Exercise 150 minutes per week (moderate activity)     Will discuss PT with doctor and plan in place to improve issues 1. Getting up from the floor; ( wood floor) 2. Bathroom safety 3. Right ankle strength; gives away; to review for ? Brace  4. Bending over to tie shoe      . Patient Stated  Stay as healthy and as independent as possible. I want to ride a motorcycle one more time.        Fall Risk Fall Risk  09/12/2017 06/22/2017 09/02/2016 06/21/2016 12/23/2015  Falls in the past year? Yes No Yes No No  Number falls in past yr: 2 or more - 1 - -  Injury with Fall? No - - - -  Risk Factor Category  High Fall Risk - - - -  Risk for fall due to : Impaired mobility;Impaired balance/gait - Impaired mobility - -  Follow up Falls prevention discussed;Education provided - Falls prevention discussed - -    Depression Screen PHQ 2/9 Scores 09/12/2017 06/22/2017 09/02/2016 06/21/2016  PHQ - 2 Score 1 0 0 0    Cognitive Function       Ad8 score reviewed for issues:  Issues making decisions: no  Less interest in hobbies / activities: no  Repeats questions, stories (family complaining): no  Trouble using ordinary gadgets (microwave, computer, phone):no  Forgets the month or year: no  Mismanaging finances: no  Remembering appts: no  Daily problems with thinking and/or memory: no Ad8 score is= 0  Immunization History  Administered Date(s) Administered  . Influenza,inj,Quad PF,6+ Mos 02/04/2014, 01/20/2015, 12/23/2015  . Influenza-Unspecified 03/29/2011  . Td 12/23/2015    Screening Tests Health Maintenance  Topic Date Due  . INFLUENZA VACCINE  10/26/2017  . COLONOSCOPY  09/25/2018  . TETANUS/TDAP  12/22/2025  . Hepatitis C Screening  Completed  . HIV Screening  Completed        Plan:   Tub  bench ordered for safety and to help maintain independence with ADLs.  Continue doing brain stimulating activities (puzzles, reading, adult coloring books, staying active) to keep memory sharp.   Continue to eat heart healthy diet (full of fruits, vegetables, whole grains, lean protein, water--limit salt, fat, and sugar intake) and increase physical activity as tolerated.   I have personally reviewed and noted the following in the patient's chart:   . Medical and social history . Use of alcohol, tobacco or illicit drugs  . Current medications and supplements . Functional ability and status . Nutritional status . Physical activity . Advanced directives . List of other physicians . Vitals . Screenings to include cognitive, depression, and falls . Referrals and appointments  In addition, I have reviewed and discussed with patient certain preventive protocols, quality metrics, and best practice recommendations. A written personalized care plan for preventive services as well as general preventive health recommendations were provided to patient.     Michiel Cowboy, RN  09/12/2017  Medical screening examination/treatment/procedure(s) were performed by non-physician practitioner and as supervising physician I was immediately available for consultation/collaboration. I agree with above. Cathlean Cower, MD

## 2017-09-12 NOTE — Patient Instructions (Addendum)
Continue doing brain stimulating activities (puzzles, reading, adult coloring books, staying active) to keep memory sharp.   Continue to eat heart healthy diet (full of fruits, vegetables, whole grains, lean protein, water--limit salt, fat, and sugar intake) and increase physical activity as tolerated.  Danny Hopkins , Thank you for taking time to come for your Medicare Wellness Visit. I appreciate your ongoing commitment to your health goals. Please review the following plan we discussed and let me know if I can assist you in the future.   These are the goals we discussed: Goals    . Exercise 150 minutes per week (moderate activity)     Will discuss PT with doctor and plan in place to improve issues 1. Getting up from the floor; ( wood floor) 2. Bathroom safety 3. Right ankle strength; gives away; to review for ? Brace  4. Bending over to tie shoe      . Patient Stated     Stay as healthy and as independent as possible. I want to ride a motorcycle one more time.        This is a list of the screening recommended for you and due dates:  Health Maintenance  Topic Date Due  . Flu Shot  10/26/2017  . Colon Cancer Screening  09/25/2018  . Tetanus Vaccine  12/22/2025  .  Hepatitis C: One time screening is recommended by Center for Disease Control  (CDC) for  adults born from [birth year] through 1963.   Completed  . HIV Screening  Completed

## 2017-09-20 ENCOUNTER — Emergency Department (HOSPITAL_BASED_OUTPATIENT_CLINIC_OR_DEPARTMENT_OTHER): Payer: Medicare Other

## 2017-09-20 ENCOUNTER — Other Ambulatory Visit: Payer: Self-pay

## 2017-09-20 ENCOUNTER — Emergency Department (HOSPITAL_BASED_OUTPATIENT_CLINIC_OR_DEPARTMENT_OTHER)
Admission: EM | Admit: 2017-09-20 | Discharge: 2017-09-20 | Disposition: A | Discharge status: Home / Self Care | Payer: Medicare Other | Attending: Emergency Medicine | Admitting: Emergency Medicine

## 2017-09-20 ENCOUNTER — Encounter (HOSPITAL_BASED_OUTPATIENT_CLINIC_OR_DEPARTMENT_OTHER): Payer: Self-pay

## 2017-09-20 DIAGNOSIS — S61412A Laceration without foreign body of left hand, initial encounter: Secondary | ICD-10-CM | POA: Diagnosis not present

## 2017-09-20 DIAGNOSIS — Z8673 Personal history of transient ischemic attack (TIA), and cerebral infarction without residual deficits: Secondary | ICD-10-CM | POA: Insufficient documentation

## 2017-09-20 DIAGNOSIS — Y929 Unspecified place or not applicable: Secondary | ICD-10-CM | POA: Diagnosis not present

## 2017-09-20 DIAGNOSIS — Z79899 Other long term (current) drug therapy: Secondary | ICD-10-CM | POA: Insufficient documentation

## 2017-09-20 DIAGNOSIS — Y998 Other external cause status: Secondary | ICD-10-CM | POA: Insufficient documentation

## 2017-09-20 DIAGNOSIS — Z23 Encounter for immunization: Secondary | ICD-10-CM | POA: Insufficient documentation

## 2017-09-20 DIAGNOSIS — W228XXA Striking against or struck by other objects, initial encounter: Secondary | ICD-10-CM | POA: Insufficient documentation

## 2017-09-20 DIAGNOSIS — Y9389 Activity, other specified: Secondary | ICD-10-CM | POA: Insufficient documentation

## 2017-09-20 MED ORDER — LIDOCAINE-EPINEPHRINE 2 %-1:100000 IJ SOLN
20.0000 mL | Freq: Once | INTRAMUSCULAR | Status: DC
Start: 2017-09-20 — End: 2017-09-20
  Filled 2017-09-20: qty 20

## 2017-09-20 MED ORDER — LIDOCAINE-EPINEPHRINE (PF) 2 %-1:200000 IJ SOLN
INTRAMUSCULAR | Status: AC
  Administered 2017-09-20: 10 mL
  Filled 2017-09-20: qty 10

## 2017-09-20 MED ORDER — TETANUS-DIPHTH-ACELL PERTUSSIS 5-2.5-18.5 LF-MCG/0.5 IM SUSP
0.5000 mL | Freq: Once | INTRAMUSCULAR | Status: AC
  Administered 2017-09-20: 0.5 mL via INTRAMUSCULAR
  Filled 2017-09-20: qty 0.5

## 2017-09-20 NOTE — ED Triage Notes (Addendum)
Pt states his grill a home lid fell on left hand approx 30 min PTA-jagged lac noted to palm of hand-abrasion noted to back of hand-sterile saline soaked gauze dsg applied in triage

## 2017-09-20 NOTE — ED Notes (Signed)
Pt verbalizes understanding of d/c instructions and denies any further needs at this time. 

## 2017-09-20 NOTE — ED Provider Notes (Signed)
[facility] EMERGENCY DEPARTMENT Provider Note   CSN: [id number] Arrival date & time: 09/20/17  1246     History   Chief Complaint Chief Complaint  Patient presents with  . Hand Injury    HPI Danny Hopkins is a 57 y.o. right-hand-dominant male who presents with a left hand injury.  Past medical history significant for CVA.  He states that he was working on a cooker and his hand got slammed inside of it.  Incident occurred at approximately 12:30 PM today.  He sustained a laceration on his palm.  He reports soreness over the area but not severe pain.  He is able to flex all his fingers however has decreased range of motion due to his prior stroke.  He is not up-to-date on tetanus.   HPI  Past Medical History:  Diagnosis Date  . Arthritis   . History of chicken pox   . 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 Stroke [facility])     Patient Active Problem List   Diagnosis Date Noted  . Acute pharyngitis 07/14/2017  . Chronic tonsillitis 07/12/2017  . Chronic pain syndrome 06/22/2017  . Bradycardia 06/22/2017  . Abnormal ECG 06/22/2017  . Smoker 06/21/2016  . Scalp pain 07/30/2015  . Muscle spasticity 06/24/2015  . Dandruff 06/24/2015  . Lymphadenitis 06/24/2015  . Burning with urination 01/25/2015  . Balanitis 01/25/2015  . Sleeping difficulties 02/04/2014  . Bladder neck obstruction 02/04/2014  . Hyperlipidemia 02/04/2014  . Other malaise and fatigue 07/24/2013  . Stroke ([place]) 07/24/2013  . Routine general medical examination at a health care facility 07/24/2013  . 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 Insomnia 10/12/2012  . Generalized anxiety disorder 10/12/2012  . Eczema 10/12/2012  . History of stroke 10/12/2012    Past Surgical History:  Procedure Laterality Date  . cervical staph infection     2006  . INGUINAL HERNIA REPAIR     kindergarten        Home Medications    Prior to Admission medications   Medication Sig  Start Date End Date Taking? Authorizing Provider  ALPRAZolam Duanne Moron) 1 MG tablet Take 1 tablet (1 mg total) by mouth 2 (two) times daily as needed. 06/22/17   Biagio Borg, MD  atorvastatin (LIPITOR) 20 MG tablet TAKE 1 TABLET (20 MG TOTAL) BY MOUTH DAILY. 06/22/17   Biagio Borg, MD  clotrimazole-betamethasone (LOTRISONE) cream APPLY TO AFFECTED AREA TWICE A DAY 12/09/16   Biagio Borg, MD  HYDROcodone-acetaminophen [facility]) 10-325 MG per tablet Take 1 tablet by mouth every 8 (eight) hours as needed.  09/24/12   [provider]  tamsulosin (FLOMAX) 0.4 MG CAPS capsule Take 1 capsule (0.4 mg total) by mouth daily. 06/22/17   Biagio Borg, MD    Family History Family History  Problem Relation Age of Onset  . Stomach cancer Neg Hx   . Rectal cancer Neg Hx   . Pancreatic cancer Neg Hx   . Colon cancer Neg Hx     Social History Social History   Tobacco Use  . Smoking status: Never Smoker  . Smokeless tobacco: Never Used  Substance Use Topics  . Alcohol use: Yes    Alcohol/week: 1.2 oz    Types: 2 Cans of beer per week    Comment: 6 can of beer weekly  . Drug use: Yes    Types: Marijuana     Allergies   Patient has no known allergies.  Review of Systems Review of Systems  Musculoskeletal: Positive for arthralgias.  Skin: Positive for wound.  Neurological: Negative for weakness and numbness.  All other systems reviewed and are negative.    Physical Exam Updated Vital Signs BP 120/66 (BP Location: Left Arm)   Pulse 88   Temp 98.4 F (36.9 C) (Oral)   Resp 18   Ht 5\' 7"  (1.702 m)   Wt 62.6 kg (138 lb)   SpO2 100%   BMI 21.61 kg/m   Physical Exam  Constitutional: He is oriented to person, place, and time. He appears well-developed and well-nourished. No distress.  HENT:  Head: Normocephalic and atraumatic.  Eyes: Pupils are equal, round, and reactive to light. Conjunctivae are normal. Right eye exhibits no discharge. Left eye exhibits no discharge. No  scleral icterus.  Neck: Normal range of motion.  Cardiovascular: Normal rate.  Pulmonary/Chest: Effort normal. No respiratory distress.  Abdominal: He exhibits no distension.  Musculoskeletal:  3 cm V-shaped laceration over the left thenar eminence. Bleeding controlled.  Neurological: He is alert and oriented to person, place, and time.  Skin: Skin is warm and dry.  Psychiatric: He has a normal mood and affect. His behavior is normal.  Nursing note and vitals reviewed.    ED Treatments / Results  Labs (all labs ordered are listed, but only abnormal results are displayed) Labs Reviewed - No data to display  EKG None  Radiology Dg Hand Complete Left  Result Date: 09/20/2017 CLINICAL DATA:  Got hand stuck in grill with palmar laceration, initial encounter. EXAM: LEFT HAND - COMPLETE 3+ VIEW COMPARISON:  None. FINDINGS: There is no evidence of fracture or dislocation. There is no evidence of arthropathy or other focal bone abnormality. Soft tissues are unremarkable. IMPRESSION: No acute abnormality noted. Electronically Signed   By: Inez Catalina M.D.   On: 09/20/2017 13:21    Procedures .Marland KitchenLaceration Repair Date/Time: 09/20/2017 4:03 PM Performed by: Recardo Evangelist, PA-C Authorized by: Recardo Evangelist, PA-C   Consent:    Consent obtained:  Verbal   Consent given by:  Patient   Risks discussed:  Infection and pain   Alternatives discussed:  No treatment Anesthesia (see MAR for exact dosages):    Anesthesia method:  Local infiltration   Local anesthetic:  Lidocaine 2% WITH epi Laceration details:    Location:  Hand   Hand location:  L palm   Length (cm):  3   Depth (mm):  5 Repair type:    Repair type:  Simple Pre-procedure details:    Preparation:  Patient was prepped and draped in usual sterile fashion Exploration:    Hemostasis achieved with:  Direct pressure   Wound exploration: wound explored through full range of motion and entire depth of wound probed and  visualized     Wound extent: no vascular damage noted     Contaminated: no   Treatment:    Area cleansed with:  Shur-Clens   Amount of cleaning:  Standard   Irrigation volume:  10cc   Irrigation method:  Tap   Visualized foreign bodies/material removed: no   Skin repair:    Repair method:  Sutures   Suture size:  5-0   Suture material:  Nylon   Suture technique:  Simple interrupted   Number of sutures:  4 Approximation:    Approximation:  Close Post-procedure details:    Dressing:  Antibiotic ointment and sterile dressing   Patient tolerance of procedure:  Tolerated well, no immediate complications   (  including critical care time)    Medications Ordered in ED Medications  lidocaine-EPINEPHrine (XYLOCAINE W/EPI) 2 %-1:100000 (with pres) injection 20 mL (20 mLs Intradermal Not Given 09/20/17 1545)  Tdap (BOOSTRIX) injection 0.5 mL (0.5 mLs Intramuscular Given 09/20/17 1541)  lidocaine-EPINEPHrine (XYLOCAINE W/EPI) 2 %-1:200000 (PF) injection (10 mLs  Given by Other 09/20/17 1543)     Initial Impression / Assessment and Plan / ED Course  I have reviewed the triage vital signs and the nursing notes.  Pertinent labs & imaging results that were available during my care of the patient were reviewed by me and considered in my medical decision making (see chart for details).  57 year old with L hand laceration. Xray is negative for underlying fracture. It was repaired and irrigated in the ED. Bottom of the wound visualized and bleeding controlled. 4 sutures placed. Wound care discussed and advised to return to have stitches removed in 7-10 days. Tdap was updated. Return precautions discussed.    Final Clinical Impressions(s) / ED Diagnoses   Final diagnoses:  Laceration of left hand without foreign body, initial encounter    ED Discharge Orders    None       Recardo Evangelist, PA-C 09/20/17 [place], [place], DO 09/20/17 1644

## 2017-09-20 NOTE — ED Notes (Signed)
EDp at bedside for laceration repair

## 2017-09-20 NOTE — Discharge Instructions (Signed)
Do not get wet for 24 hours. Afterwards keep area clean by washing with soap and water daily. Apply a bandage at least once daily, change more often if it is dirty Watch for signs of infection (redness, drainage, worsening pain) Have stitches removed in 7-10 days

## 2017-09-27 ENCOUNTER — Encounter: Payer: Self-pay | Admitting: Internal Medicine

## 2017-09-27 ENCOUNTER — Ambulatory Visit (INDEPENDENT_AMBULATORY_CARE_PROVIDER_SITE_OTHER): Payer: Medicare Other | Admitting: Internal Medicine

## 2017-09-27 VITALS — BP 110/64 | HR 64 | Temp 97.7°F | Ht 67.0 in | Wt 141.0 lb

## 2017-09-27 DIAGNOSIS — S61412D Laceration without foreign body of left hand, subsequent encounter: Secondary | ICD-10-CM

## 2017-09-27 DIAGNOSIS — S61412A Laceration without foreign body of left hand, initial encounter: Secondary | ICD-10-CM | POA: Insufficient documentation

## 2017-09-27 NOTE — Patient Instructions (Signed)
Please continue all other medications as before, and refills have been done if requested.  Please have the pharmacy call with any other refills you may need.  Please continue your efforts at being more active, low cholesterol diet, and weight control.  Please keep your appointments with your specialists as you may have planned     

## 2017-09-27 NOTE — Progress Notes (Signed)
   Subjective:    Patient ID: Danny Hopkins, male    DOB: 10-08-1960, 57 y.o.   MRN: [id number]  HPI Here to f/u after unfortunate accident at home resulting in laceration left palm near the left thenar eminence; has done well, stitches out today, no s/s cellulitis, and no new complaints  Pt denies chest pain, increased sob or doe, wheezing, orthopnea, PND, increased LE swelling, palpitations, dizziness or syncope.   Pt denies polydipsia, polyuria Past Medical History:  Diagnosis Date  . Arthritis   . History of chicken pox   . 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 Stroke [facility])    Past Surgical History:  Procedure Laterality Date  . cervical staph infection     2006  . INGUINAL HERNIA REPAIR     kindergarten    reports that he has never smoked. He has never used smokeless tobacco. He reports that he drinks about 1.2 oz of alcohol per week. He reports that he has current or past drug history. Drug: Marijuana. family history is not on file. No Known Allergies Current Outpatient Medications on File Prior to Visit  Medication Sig Dispense Refill  . ALPRAZolam (XANAX) 1 MG tablet Take 1 tablet (1 mg total) by mouth 2 (two) times daily as needed. 60 tablet 5  . atorvastatin (LIPITOR) 20 MG tablet TAKE 1 TABLET (20 MG TOTAL) BY MOUTH DAILY. 90 tablet 3  . clotrimazole-betamethasone (LOTRISONE) cream APPLY TO AFFECTED AREA TWICE A DAY 30 g 2  . HYDROcodone-acetaminophen (NORCO) 10-325 MG per tablet Take 1 tablet by mouth every 8 (eight) hours as needed.     . tamsulosin (FLOMAX) 0.4 MG CAPS capsule Take 1 capsule (0.4 mg total) by mouth daily. 90 capsule 3   No current facility-administered medications on file prior to visit.    Review of Systems All otherwise neg per pt    Objective:   Physical Exam BP 110/64   Pulse 64   Temp 97.7 F (36.5 C) (Oral)   Ht 5\' 7"  (1.702 m)   Wt 141 lb (64 kg)   SpO2 98%   BMI 22.08 kg/m  VS noted,  Constitutional: Pt  appears in NAD HENT: Head: NCAT.  Right Ear: External ear normal.  Left Ear: External ear normal.  Eyes: . Pupils are equal, round, and reactive to light. Conjunctivae and EOM are normal Nose: without d/c or deformity Neck: Neck supple. Gross normal ROM Cardiovascular: Normal rate and regular rhythm.   Pulmonary/Chest: Effort normal and breath sounds without rales or wheezing.  Neurological: Pt is alert. At baseline orientation, motor grossly intact Skin: Skin is warm. Laceration intact, stitches removed,No rashes, other new lesions, no LE edema Psychiatric: Pt behavior is normal without agitation  No other exam findings     Assessment & Plan:

## 2017-09-27 NOTE — Assessment & Plan Note (Signed)
Wound intact, stitches out today, no further tx needed

## 2017-12-09 DIAGNOSIS — Z23 Encounter for immunization: Secondary | ICD-10-CM | POA: Diagnosis not present

## 2017-12-12 ENCOUNTER — Other Ambulatory Visit: Payer: Self-pay | Admitting: Internal Medicine

## 2017-12-12 NOTE — Telephone Encounter (Signed)
Done erx 

## 2017-12-19 DIAGNOSIS — M4712 Other spondylosis with myelopathy, cervical region: Secondary | ICD-10-CM | POA: Diagnosis not present

## 2018-04-19 DIAGNOSIS — M4712 Other spondylosis with myelopathy, cervical region: Secondary | ICD-10-CM | POA: Diagnosis not present

## 2018-04-30 ENCOUNTER — Other Ambulatory Visit: Payer: Self-pay | Admitting: Internal Medicine

## 2018-04-30 DIAGNOSIS — L309 Dermatitis, unspecified: Secondary | ICD-10-CM

## 2018-06-05 ENCOUNTER — Other Ambulatory Visit: Payer: Self-pay | Admitting: Internal Medicine

## 2018-06-05 NOTE — Telephone Encounter (Signed)
Done erx 

## 2018-06-25 ENCOUNTER — Telehealth: Payer: Self-pay | Admitting: *Deleted

## 2018-06-25 NOTE — Telephone Encounter (Signed)
I called pt Re: 06/26/18 OV with PCP for 1 year f/u due to COVID-19 pandemic. I offered a virtual visit and patient agreed at first. I began to tell him he needs to download the Webex app and he decided to r/s his 1 year f/u appt.  OV scheduled 08/22/18 @ 10:20. Pt verbalized understanding.

## 2018-06-26 ENCOUNTER — Ambulatory Visit: Payer: Medicare Other | Admitting: Internal Medicine

## 2018-07-13 ENCOUNTER — Other Ambulatory Visit: Payer: Self-pay | Admitting: Internal Medicine

## 2018-08-02 ENCOUNTER — Encounter: Payer: Self-pay | Admitting: Internal Medicine

## 2018-08-22 ENCOUNTER — Other Ambulatory Visit (INDEPENDENT_AMBULATORY_CARE_PROVIDER_SITE_OTHER): Payer: Medicare Other

## 2018-08-22 ENCOUNTER — Encounter: Payer: Self-pay | Admitting: Internal Medicine

## 2018-08-22 ENCOUNTER — Ambulatory Visit: Payer: Medicare Other | Admitting: Internal Medicine

## 2018-08-22 ENCOUNTER — Other Ambulatory Visit: Payer: Self-pay

## 2018-08-22 ENCOUNTER — Ambulatory Visit (INDEPENDENT_AMBULATORY_CARE_PROVIDER_SITE_OTHER): Payer: Medicare Other | Admitting: Internal Medicine

## 2018-08-22 VITALS — BP 114/68 | HR 51 | Temp 98.0°F | Ht 67.0 in | Wt 142.0 lb

## 2018-08-22 DIAGNOSIS — E538 Deficiency of other specified B group vitamins: Secondary | ICD-10-CM

## 2018-08-22 DIAGNOSIS — N320 Bladder-neck obstruction: Secondary | ICD-10-CM

## 2018-08-22 DIAGNOSIS — E559 Vitamin D deficiency, unspecified: Secondary | ICD-10-CM

## 2018-08-22 DIAGNOSIS — E785 Hyperlipidemia, unspecified: Secondary | ICD-10-CM

## 2018-08-22 DIAGNOSIS — E611 Iron deficiency: Secondary | ICD-10-CM | POA: Diagnosis not present

## 2018-08-22 DIAGNOSIS — F411 Generalized anxiety disorder: Secondary | ICD-10-CM | POA: Diagnosis not present

## 2018-08-22 DIAGNOSIS — M4712 Other spondylosis with myelopathy, cervical region: Secondary | ICD-10-CM | POA: Diagnosis not present

## 2018-08-22 LAB — CBC WITH DIFFERENTIAL/PLATELET
Basophils Absolute: 0 10*3/uL (ref 0.0–0.1)
Basophils Relative: 0.6 % (ref 0.0–3.0)
Eosinophils Absolute: 0.1 10*3/uL (ref 0.0–0.7)
Eosinophils Relative: 1.6 % (ref 0.0–5.0)
HCT: 48.4 % (ref 39.0–52.0)
Hemoglobin: 16.5 g/dL (ref 13.0–17.0)
Lymphocytes Relative: 43.4 % (ref 12.0–46.0)
Lymphs Abs: 2 10*3/uL (ref 0.7–4.0)
MCHC: 34 g/dL (ref 30.0–36.0)
MCV: 93.1 fl (ref 78.0–100.0)
Monocytes Absolute: 0.6 10*3/uL (ref 0.1–1.0)
Monocytes Relative: 12.6 % — ABNORMAL HIGH (ref 3.0–12.0)
Neutro Abs: 1.9 10*3/uL (ref 1.4–7.7)
Neutrophils Relative %: 41.8 % — ABNORMAL LOW (ref 43.0–77.0)
Platelets: 180 10*3/uL (ref 150.0–400.0)
RBC: 5.2 Mil/uL (ref 4.22–5.81)
RDW: 14.6 % (ref 11.5–15.5)
WBC: 4.6 10*3/uL (ref 4.0–10.5)

## 2018-08-22 LAB — BASIC METABOLIC PANEL
BUN: 13 mg/dL (ref 6–23)
CO2: 30 mEq/L (ref 19–32)
Calcium: 9.5 mg/dL (ref 8.4–10.5)
Chloride: 103 mEq/L (ref 96–112)
Creatinine, Ser: 0.95 mg/dL (ref 0.40–1.50)
GFR: 99.15 mL/min (ref >60.00)
Glucose, Bld: 85 mg/dL (ref 70–99)
Potassium: 4.1 mEq/L (ref 3.5–5.1)
Sodium: 141 mEq/L (ref 135–145)

## 2018-08-22 LAB — HEPATIC FUNCTION PANEL
ALT: 13 U/L (ref 0–53)
AST: 11 U/L (ref 0–37)
Albumin: 4.2 g/dL (ref 3.5–5.2)
Alkaline Phosphatase: 64 U/L (ref 39–117)
Bilirubin, Direct: 0.2 mg/dL (ref 0.0–0.3)
Total Bilirubin: 1.2 mg/dL (ref 0.2–1.2)
Total Protein: 6.3 g/dL (ref 6.0–8.3)

## 2018-08-22 LAB — LIPID PANEL
Cholesterol: 129 mg/dL (ref 0–200)
HDL: 50.5 mg/dL (ref >39.00)
LDL Cholesterol: 68 mg/dL (ref 0–99)
NonHDL: 78.12
Total CHOL/HDL Ratio: 3
Triglycerides: 53 mg/dL (ref 0.0–149.0)
VLDL: 10.6 mg/dL (ref 0.0–40.0)

## 2018-08-22 LAB — VITAMIN B12: Vitamin B-12: 319 pg/mL (ref 211–911)

## 2018-08-22 LAB — IBC PANEL
Iron: 112 ug/dL (ref 42–165)
Saturation Ratios: 32.4 % (ref 20.0–50.0)
Transferrin: 247 mg/dL (ref 212.0–360.0)

## 2018-08-22 LAB — PSA: PSA: 0.37 ng/mL (ref 0.10–4.00)

## 2018-08-22 LAB — TSH: TSH: 0.97 u[IU]/mL (ref 0.35–4.50)

## 2018-08-22 LAB — VITAMIN D 25 HYDROXY (VIT D DEFICIENCY, FRACTURES): VITD: 20.03 ng/mL — ABNORMAL LOW (ref 30.00–100.00)

## 2018-08-22 NOTE — Patient Instructions (Signed)

## 2018-08-22 NOTE — Progress Notes (Signed)
Subjective:    Patient ID: Danny Hopkins, male    DOB: Aug 24, 1960, 58 y.o.   MRN: [id number]  HPI  Here for yearly f/u;  Overall doing ok;  Pt denies Chest pain, worsening SOB, DOE, wheezing, orthopnea, PND, worsening LE edema, palpitations, dizziness or syncope.  Pt denies neurological change such as new headache, facial or extremity weakness.  Pt denies polydipsia, polyuria, or low sugar symptoms. Pt states overall good compliance with treatment and medications, good tolerability, and has been trying to follow appropriate diet.  Pt denies worsening depressive symptoms, suicidal ideation or panic. No fever, night sweats, wt loss, loss of appetite, or other constitutional symptoms.  Pt states good ability with ADL's, has low fall risk, home safety reviewed and adequate, no other significant changes in hearing or vision, and only occasionally active with exercise. Walks with cane after interrmittent random spasms to RLE wih pain due to prior spine disease.  No new complaints Past Medical History:  Diagnosis Date  . Arthritis   . History of chicken pox   . 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 Stroke [facility])    Past Surgical History:  Procedure Laterality Date  . cervical staph infection     2006  . INGUINAL HERNIA REPAIR     kindergarten    reports that he has never smoked. He has never used smokeless tobacco. He reports current alcohol use of about 2.0 standard drinks of alcohol per week. He reports current drug use. Drug: Marijuana. family history is not on file. No Known Allergies Current Outpatient Medications on File Prior to Visit  Medication Sig Dispense Refill  . ALPRAZolam (XANAX) 1 MG tablet TAKE 1 TABLET BY MOUTH TWICE A DAY AS NEEDED 60 tablet 3  . atorvastatin (LIPITOR) 20 MG tablet TAKE 1 TABLET BY MOUTH EVERY DAY 90 tablet 3  . clotrimazole-betamethasone (LOTRISONE) cream APPLY TO AFFECTED AREA TWICE A DAY 30 g 2  . HYDROcodone-acetaminophen (NORCO)  10-325 MG per tablet Take 1 tablet by mouth every 8 (eight) hours as needed.     . tamsulosin (FLOMAX) 0.4 MG CAPS capsule TAKE 1 CAPSULE BY MOUTH EVERY DAY 90 capsule 3   No current facility-administered medications on file prior to visit.    Review of Systems Constitutional: Negative for other unusual diaphoresis, sweats, appetite or weight changes HENT: Negative for other worsening hearing loss, ear pain, facial swelling, mouth sores or neck stiffness.   Eyes: Negative for other worsening pain, redness or other visual disturbance.  Respiratory: Negative for other stridor or swelling Cardiovascular: Negative for other palpitations or other chest pain  Gastrointestinal: Negative for worsening diarrhea or loose stools, blood in stool, distention or other pain Genitourinary: Negative for hematuria, flank pain or other change in urine volume.  Musculoskeletal: Negative for myalgias or other joint swelling.  Skin: Negative for other color change, or other wound or worsening drainage.  Neurological: Negative for other syncope or numbness. Hematological: Negative for other adenopathy or swelling Psychiatric/Behavioral: Negative for hallucinations, other worsening agitation, SI, self-injury, or new decreased concentration All other system neg per pt    Objective:   Physical Exam BP 114/68   Pulse (!) 51   Temp 98 F (36.7 C) (Oral)   Ht 5\' 7"  (1.702 m)   Wt 142 lb (64.4 kg)   SpO2 98%   BMI 22.24 kg/m  VS noted,  Constitutional: Pt is oriented to person, place, and time. Appears well-developed and well-nourished, in no  significant distress and comfortable Head: Normocephalic and atraumatic  Eyes: Conjunctivae and EOM are normal. Pupils are equal, round, and reactive to light Right Ear: External ear normal without discharge Left Ear: External ear normal without discharge Nose: Nose without discharge or deformity Mouth/Throat: Oropharynx is without other ulcerations and moist  Neck:  Normal range of motion. Neck supple. No JVD present. No tracheal deviation present or significant neck LA or mass Cardiovascular: Normal rate, regular rhythm, normal heart sounds and intact distal pulses.   Pulmonary/Chest: WOB normal and breath sounds without rales or wheezing  Abdominal: Soft. Bowel sounds are normal. NT. No HSM  Musculoskeletal: Normal range of motion. Exhibits no edema Lymphadenopathy: Has no other cervical adenopathy.  Neurological: Pt is alert and oriented to person, place, and time. Pt has normal reflexes. No cranial nerve deficit. Motor grossly intact, abnormal gait with spasmodic standing and ambulating Skin: Skin is warm and dry. No rash noted or new ulcerations Psychiatric:  Has nervous mood and affect. Behavior is normal without agitation No other exam findings Lab Results  Component Value Date   WBC 4.6 08/22/2018   HGB 16.5 08/22/2018   HCT 48.4 08/22/2018   PLT 180.0 08/22/2018   GLUCOSE 85 08/22/2018   CHOL 129 08/22/2018   TRIG 53.0 08/22/2018   HDL 50.50 08/22/2018   LDLCALC 68 08/22/2018   ALT 13 08/22/2018   AST 11 08/22/2018   NA 141 08/22/2018   K 4.1 08/22/2018   CL 103 08/22/2018   CREATININE 0.95 08/22/2018   BUN 13 08/22/2018   CO2 30 08/22/2018   TSH 0.97 08/22/2018   PSA 0.37 08/22/2018        Assessment & Plan:

## 2018-08-23 ENCOUNTER — Telehealth: Payer: Self-pay

## 2018-08-23 ENCOUNTER — Other Ambulatory Visit (INDEPENDENT_AMBULATORY_CARE_PROVIDER_SITE_OTHER): Payer: Medicare Other

## 2018-08-23 ENCOUNTER — Telehealth: Payer: Self-pay | Admitting: *Deleted

## 2018-08-23 DIAGNOSIS — E785 Hyperlipidemia, unspecified: Secondary | ICD-10-CM

## 2018-08-23 LAB — URINALYSIS, ROUTINE W REFLEX MICROSCOPIC
Bilirubin Urine: NEGATIVE
Hgb urine dipstick: NEGATIVE
Ketones, ur: NEGATIVE
Leukocytes,Ua: NEGATIVE
Nitrite: NEGATIVE
Specific Gravity, Urine: 1.025 (ref 1.000–1.030)
Total Protein, Urine: NEGATIVE
Urine Glucose: NEGATIVE
Urobilinogen, UA: 1 (ref 0.0–1.0)
pH: 6.5 (ref 5.0–8.0)

## 2018-08-23 NOTE — Telephone Encounter (Signed)
-----   Message from Biagio Borg, MD sent at 08/22/2018  8:50 PM EDT ----- Left message on MyChart, pt to cont same tx except  The test results show that your current treatment is OK, except the Vitamin D level is mildly low.  Please take OTC Vitamin D3 at 2000 units per day indefinitely, as this will help your bone health.    Katyra Tomassetti to please inform pt

## 2018-08-23 NOTE — Telephone Encounter (Signed)
Pt has been informed of results and expressed understanding.  °

## 2018-08-23 NOTE — Telephone Encounter (Signed)
Pt contacted off recall list for yearly f/u. He has been scheduled with Kathyrn Drown, 08/27/2018.  Pt understands he will get a call 15 mins prior to go over his vitals, allergies, and med list.  Pt doesn't have mychart gave verbal consent for virtual visit.      Virtual Visit Pre-Appointment Phone Call  "(Name), I am calling you today to discuss your upcoming appointment. We are currently trying to limit exposure to the virus that causes COVID-19 by seeing patients at home rather than in the office."  1. "What is the BEST phone number to call the day of the visit?" - include this in appointment notes  2. "Do you have or have access to (through a family member/friend) a smartphone with video capability that we can use for your visit?" a. If yes - list this number in appt notes as "cell" (if different from BEST phone #) and list the appointment type as a VIDEO visit in appointment notes b. If no - list the appointment type as a PHONE visit in appointment notes  3. Confirm consent - "In the setting of the current Covid19 crisis, you are scheduled for a (phone or video) visit with your provider on (date) at (time).  Just as we do with many in-office visits, in order for you to participate in this visit, we must obtain consent.  If you'd like, I can send this to your mychart (if signed up) or email for you to review.  Otherwise, I can obtain your verbal consent now.  All virtual visits are billed to your insurance company just like a normal visit would be.  By agreeing to a virtual visit, we'd like you to understand that the technology does not allow for your provider to perform an examination, and thus may limit your provider's ability to fully assess your condition. If your provider identifies any concerns that need to be evaluated in person, we will make arrangements to do so.  Finally, though the technology is pretty good, we cannot assure that it will always work on either your or our end, and in  the setting of a video visit, we may have to convert it to a phone-only visit.  In either situation, we cannot ensure that we have a secure connection.  Are you willing to proceed?" STAFF: Did the patient verbally acknowledge consent to telehealth visit? Document YES/NO here: YES  4. Advise patient to be prepared - "Two hours prior to your appointment, go ahead and check your blood pressure, pulse, oxygen saturation, and your weight (if you have the equipment to check those) and write them all down. When your visit starts, your provider will ask you for this information. If you have an Apple Watch or Kardia device, please plan to have heart rate information ready on the day of your appointment. Please have a pen and paper handy nearby the day of the visit as well."  5. Give patient instructions for MyChart download to smartphone OR Doximity/Doxy.me as below if video visit (depending on what platform provider is using)  6. Inform patient they will receive a phone call 15 minutes prior to their appointment time (may be from unknown caller ID) so they should be prepared to answer    TELEPHONE CALL NOTE  Danny Hopkins has been deemed a candidate for a follow-up tele-health visit to limit community exposure during the Covid-19 pandemic. I spoke with the patient via phone to ensure availability of phone/video source, confirm preferred email & phone  number, and discuss instructions and expectations.  I reminded Danny Hopkins to be prepared with any vital sign and/or heart rhythm information that could potentially be obtained via home monitoring, at the time of his visit. I reminded Danny Hopkins to expect a phone call prior to his visit.  Jeanann Lewandowsky, [place] 08/23/2018 9:40 AM   INSTRUCTIONS FOR DOWNLOADING THE MYCHART APP TO SMARTPHONE  - The patient must first make sure to have activated MyChart and know their login information - If Apple, go to [employer] and type in MyChart in the search  bar and download the app. If Android, ask patient to go to [employer] and type in [place] in the search bar and download the app. The app is free but as with any other app downloads, their phone may require them to verify saved payment information or Apple/Android password.  - The patient will need to then log into the app with their MyChart username and password, and select [facility] as their healthcare provider to link the account. When it is time for your visit, go to the MyChart app, find appointments, and click Begin Video Visit. Be sure to Select Allow for your device to access the Microphone and Camera for your visit. You will then be connected, and your provider will be with you shortly.  **If they have any issues connecting, or need assistance please contact MyChart service desk [phone])**  **If using a computer, in order to ensure the best quality for their visit they will need to use either of the following Internet Browsers: [employer], or Google Chrome**  IF USING DOXIMITY or DOXY.ME - The patient will receive a link just prior to their visit by text.     FULL LENGTH CONSENT FOR TELE-HEALTH VISIT   I hereby voluntarily request, consent and authorize [facility] and its employed or contracted physicians, physician assistants, nurse practitioners or other licensed health care professionals (the Practitioner), to provide me with telemedicine health care services (the "Services") as deemed necessary by the treating Practitioner. I acknowledge and consent to receive the Services by the Practitioner via telemedicine. I understand that the telemedicine visit will involve communicating with the Practitioner through live audiovisual communication technology and the disclosure of certain medical information by electronic transmission. I acknowledge that I have been given the opportunity to request an in-person assessment or other available alternative prior to the  telemedicine visit and am voluntarily participating in the telemedicine visit.  I understand that I have the right to withhold or withdraw my consent to the use of telemedicine in the course of my care at any time, without affecting my right to future care or treatment, and that the Practitioner or I may terminate the telemedicine visit at any time. I understand that I have the right to inspect all information obtained and/or recorded in the course of the telemedicine visit and may receive copies of available information for a reasonable fee.  I understand that some of the potential risks of receiving the Services via telemedicine include:  Marland Kitchen Delay or interruption in medical evaluation due to technological equipment failure or disruption; . Information transmitted may not be sufficient (e.g. poor resolution of images) to allow for appropriate medical decision making by the Practitioner; and/or  . In rare instances, security protocols could fail, causing a breach of personal health information.  Furthermore, I acknowledge that it is my responsibility to provide information about my medical history, conditions and care that is complete and  accurate to the best of my ability. I acknowledge that Practitioner's advice, recommendations, and/or decision may be based on factors not within their control, such as incomplete or inaccurate data provided by me or distortions of diagnostic images or specimens that may result from electronic transmissions. I understand that the practice of medicine is not an exact science and that Practitioner makes no warranties or guarantees regarding treatment outcomes. I acknowledge that I will receive a copy of this consent concurrently upon execution via email to the email address I last provided but may also request a printed copy by calling the office of [facility].    I understand that my insurance will be billed for this visit.   I have read or had this consent read to me.  . I understand the contents of this consent, which adequately explains the benefits and risks of the Services being provided via telemedicine.  . I have been provided ample opportunity to ask questions regarding this consent and the Services and have had my questions answered to my satisfaction. . I give my informed consent for the services to be provided through the use of telemedicine in my medical care  By participating in this telemedicine visit I agree to the above.

## 2018-08-24 NOTE — Telephone Encounter (Signed)
Pt has been informed to take OTC as suggested by PCP.

## 2018-08-24 NOTE — Telephone Encounter (Signed)
Pt called and stated that he is taking a multi vitamins with 15 mg of vitamins D . Pt would like to know if he still need to take the 2000 units a day. Please advise

## 2018-08-25 ENCOUNTER — Encounter: Payer: Self-pay | Admitting: Internal Medicine

## 2018-08-25 DIAGNOSIS — E559 Vitamin D deficiency, unspecified: Secondary | ICD-10-CM | POA: Insufficient documentation

## 2018-08-25 NOTE — Assessment & Plan Note (Signed)
Asympt, for psa with labs 

## 2018-08-25 NOTE — Assessment & Plan Note (Signed)
stable overall by history and exam, recent data reviewed with pt, and pt to continue medical treatment as before,  to f/u any worsening symptoms or concerns, for lipids with labs 

## 2018-08-25 NOTE — Assessment & Plan Note (Signed)
stable overall by history and exam, recent data reviewed with pt, and pt to continue medical treatment as before,  to f/u any worsening symptoms or concerns  

## 2018-08-25 NOTE — Assessment & Plan Note (Signed)
For vit d level, may need replacement

## 2018-08-26 NOTE — Progress Notes (Signed)
Virtual Visit via Video Note   This visit type was conducted due to national recommendations for restrictions regarding the COVID-19 Pandemic (e.g. social distancing) in an effort to limit this patient's exposure and mitigate transmission in our community.  Due to his co-morbid illnesses, this patient is at least at moderate risk for complications without adequate follow up.  This format is felt to be most appropriate for this patient at this time.  All issues noted in this document were discussed and addressed.  A limited physical exam was performed with this format.  Please refer to the patient's chart for his consent to telehealth for [facility].   Date:  08/27/2018   ID:  Danny Hopkins, DOB July 07, 1960, MRN [id number]  Patient Location: Home Provider Location: Home  PCP:  Biagio Borg, MD  Cardiologist:  Ena Dawley, MD   Evaluation Performed:  Follow-Up Visit  Chief Complaint: 1 year follow-up, seen for Dr. Meda Coffee  History of Present Illness:    Danny Hopkins is a 58 y.o. male with a history of bradycardia, hyperlipidemia, stroke>>with residual gait problem, despite that patient able to fully work and completely independent and bradycardia.   Danny Hopkins was last seen by his primary cardiologist, Dr. Meda Coffee on 07/03/2017 for bradycardia.  At that time, he had recently presented to his primary care physician 06/22/2017 and was found to be in sinus bradycardia.  He had no symptoms of dizziness, presyncope or syncopal episodes.  He denied chest pain, shortness of breath or LE swelling.  EKG showed bradycardia with negative T waves in inferior lateral leads which was noted to be unchanged from prior EKG. Given TWI a stress test was performed to evaluate for ischemia.  Bradycardia was evaluated using a Bruce protocol to evaluate for chronotropic incompetence.   Stress test was performed 07/12/2017 and found to be normal with no evidence of ischemia or previous infarction. Visually  estimated LV function at 53%, considered a low risk study.   Today [name] is doing very well from a cardiac perspective.  His BP is very well controlled at 120/72 today.  HR is 74 bpm.  He denies chest pain, PND, palpitations, dizziness, fatigue, shortness of breath, LE swelling, orthopnea symptoms or syncope.  He was recently seen by his primary care provider on 08/22/2018 with lab work.  Everything looks stable. He follows with neurology for post CVA care. Continues with statin therapy. Overall, doing well with no specific complaint.    The patient does not have symptoms concerning for COVID-19 infection (fever, chills, cough, or new shortness of breath).   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    tamsulosin (FLOMAX) 0.4 MG CAPS capsule TAKE 1 CAPSULE BY MOUTH EVERY DAY    Allergies:  Patient has no known allergies.   Social History   Tobacco Use   Smoking status: Never Smoker   Smokeless tobacco: Never Used  Substance Use Topics   Alcohol use: Yes    Alcohol/week: 2.0 standard drinks    Types: 2 Cans of beer per week    Comment: 6 can of beer weekly   Drug use: Yes    Types: Marijuana     Family Hx: The patient's family history is negative for Stomach cancer, Rectal cancer, Pancreatic cancer, and Colon cancer.  ROS:   Please see the  history of present illness.     All other systems reviewed and are negative.  Prior CV studies:   The following studies were reviewed today:  Echocardiogram: 06/30/2017 - Left ventricle: The cavity size was normal. Wall thickness was increased in a pattern of mild LVH. Systolic function was normal. The estimated ejection fraction was in the range of 60% to 65%. Wall motion was normal; there were no regional wall motion abnormalities. Left ventricular diastolic function parameters were normal. - Aortic valve: Transvalvular velocity was within the normal range. There was no stenosis. There was no regurgitation. - Mitral valve: Transvalvular velocity was within the normal range. There was no evidence for stenosis. There was trivial regurgitation. - Right ventricle: The cavity size was normal. Wall thickness was normal. - Atrial septum: No defect or patent foramen ovale was identified by color flow Doppler. - Tricuspid valve: There was trivial regurgitation. - Pulmonary arteries: Systolic pressure was within the normal range. PA peak pressure: 22 mm Hg (S).  Labs/Other Tests and Data Reviewed:    EKG:  An ECG dated 07/03/2017 was personally reviewed today and demonstrated:  NSR with TWI in inferior leads, HR 69  Recent Labs: 08/22/2018: ALT 13; BUN 13; Creatinine, Ser 0.95; Hemoglobin 16.5; Platelets 180.0; Potassium 4.1; Sodium 141; TSH 0.97   Recent Lipid Panel Lab Results  Component Value Date/Time   CHOL 129 08/22/2018 01:36 PM   TRIG 53.0 08/22/2018 01:36 PM   HDL 50.50 08/22/2018 01:36 PM   CHOLHDL 3 08/22/2018 01:36 PM   LDLCALC 68 08/22/2018 01:36 PM    Wt Readings from Last 3 Encounters:  08/27/18 148 lb (67.1 kg)  08/22/18 142 lb (64.4 kg)  09/27/17 141 lb (64 kg)     Objective:    Vital Signs:  BP 120/72    Pulse 76    Ht 5\' 7"  (1.702 m)    Wt 148 lb (67.1 kg)    SpO2 96%    BMI 23.18 kg/m    VITAL SIGNS:  reviewed GEN:  no acute  distress EYES:  sclerae anicteric RESPIRATORY:  normal respiratory effort SKIN:  no rash, lesions or ulcers. MUSCULOSKELETAL:  no obvious deformities. NEURO:  alert and oriented x 3, no obvious focal deficit PSYCH:  normal affect  ASSESSMENT & PLAN:    1.  Bradycardia: -HR today, 74bpm with a average HR of  -Nuclear stress test performed last year to evaluate for chronotropic incompetence which was found to be normal -Denies dizziness, chronic fatigue or syncope -If no other recurrence, could consider follow-up on a PRN basis  2.  Abnormal EKG: -Referred by PCP for bradycardia>> EKG with DWI in inferior lateral leads which was unchanged from prior tracing however nuclear stress test was performed to evaluate for ischemia which was found to be normal with no ischemia or prior infarct noted.  LV function was estimated at 53%. -Needs repeat at next office  visit   3.  Hyperlipidemia: -Last LDL, 68 on 08/22/2018 at goal -Followed by PCP -Continue atorvastatin  4.  Prior stroke: -Followed by neurology -No deficit, continues to work -Continue atorvastatin   COVID-19 Education: The signs and symptoms of COVID-19 were discussed with the patient and how to seek care for testing (follow up with PCP or arrange E-visit). The importance of social distancing was discussed today.  Time:   Today, I have spent 15 minutes with the patient with telehealth technology discussing the above problems.     Medication Adjustments/Labs and Tests Ordered: Current medicines are reviewed at length with the patient today.  Concerns regarding medicines are outlined above.   Tests Ordered: No orders of the defined types were placed in this encounter.   Medication Changes: No orders of the defined types were placed in this encounter.   Disposition:  Follow up Dr. Meda Coffee in 1 year or sooner if needed  Signed, Kathyrn Drown, NP  08/27/2018 11:29 AM    [place]

## 2018-08-27 ENCOUNTER — Encounter: Payer: Self-pay | Admitting: Cardiology

## 2018-08-27 ENCOUNTER — Telehealth (INDEPENDENT_AMBULATORY_CARE_PROVIDER_SITE_OTHER): Payer: Medicare Other | Admitting: Cardiology

## 2018-08-27 ENCOUNTER — Other Ambulatory Visit: Payer: Self-pay

## 2018-08-27 VITALS — BP 120/72 | HR 76 | Ht 67.0 in | Wt 148.0 lb

## 2018-08-27 DIAGNOSIS — R001 Bradycardia, unspecified: Secondary | ICD-10-CM

## 2018-08-27 DIAGNOSIS — E782 Mixed hyperlipidemia: Secondary | ICD-10-CM

## 2018-08-27 NOTE — Patient Instructions (Signed)
Medication Instructions:  Your physician recommends that you continue on your current medications as directed. Please refer to the Current Medication list given to you today.  If you need a refill on your cardiac medications before your next appointment, please call your pharmacy.   Lab work: NONE If you have labs (blood work) drawn today and your tests are completely normal, you will receive your results only by: Marland Kitchen MyChart Message (if you have MyChart) OR . A paper copy in the mail If you have any lab test that is abnormal or we need to change your treatment, we will call you to review the results.  Testing/Procedures: NONE  Follow-Up: At [facility], you and your health needs are our priority.  As part of our continuing mission to provide you with exceptional heart care, we have created designated Provider Care Teams.  These Care Teams include your primary Cardiologist (physician) and Advanced Practice Providers (APPs -  Physician Assistants and Nurse Practitioners) who all work together to provide you with the care you need, when you need it. You will need a follow up appointment in 12 months.  Please call our office 2 months in advance to schedule this appointment.  You may see Ena Dawley, MD or one of the following Advanced Practice Providers on your designated Care Team:   [place], PA-C Melina Copa, PA-C . Ermalinda Barrios, PA-C

## 2018-09-26 ENCOUNTER — Other Ambulatory Visit: Payer: Self-pay | Admitting: Internal Medicine

## 2018-09-26 NOTE — Telephone Encounter (Signed)
Done erx 

## 2018-11-29 ENCOUNTER — Other Ambulatory Visit: Payer: Self-pay | Admitting: Internal Medicine

## 2018-11-29 DIAGNOSIS — L309 Dermatitis, unspecified: Secondary | ICD-10-CM

## 2018-12-10 DIAGNOSIS — Z23 Encounter for immunization: Secondary | ICD-10-CM | POA: Diagnosis not present

## 2018-12-26 DIAGNOSIS — M4712 Other spondylosis with myelopathy, cervical region: Secondary | ICD-10-CM | POA: Diagnosis not present

## 2019-01-28 ENCOUNTER — Other Ambulatory Visit: Payer: Self-pay | Admitting: Internal Medicine

## 2019-01-28 NOTE — Telephone Encounter (Signed)
Done erx 

## 2019-01-28 NOTE — Telephone Encounter (Signed)
[place] Controlled Database Checked Last filled: 12/26/18 # 60 LOV w/you: 08/22/18 Next appt w/you: 08/23/19

## 2019-03-19 DIAGNOSIS — Z20828 Contact with and (suspected) exposure to other viral communicable diseases: Secondary | ICD-10-CM | POA: Diagnosis not present

## 2019-05-01 DIAGNOSIS — M4712 Other spondylosis with myelopathy, cervical region: Secondary | ICD-10-CM | POA: Diagnosis not present

## 2019-05-27 ENCOUNTER — Other Ambulatory Visit: Payer: Self-pay | Admitting: Internal Medicine

## 2019-05-27 MED ORDER — ALPRAZOLAM 1 MG PO TABS: 1.0000 mg | ORAL_TABLET | Freq: Two times a day (BID) | ORAL | 2 refills | Status: DC | PRN

## 2019-05-27 NOTE — Telephone Encounter (Signed)
Done erx 

## 2019-05-29 DIAGNOSIS — M4712 Other spondylosis with myelopathy, cervical region: Secondary | ICD-10-CM | POA: Diagnosis not present

## 2019-05-29 DIAGNOSIS — R252 Cramp and spasm: Secondary | ICD-10-CM | POA: Insufficient documentation

## 2019-07-16 DIAGNOSIS — Z23 Encounter for immunization: Secondary | ICD-10-CM | POA: Diagnosis not present

## 2019-07-17 DIAGNOSIS — Z23 Encounter for immunization: Secondary | ICD-10-CM | POA: Diagnosis not present

## 2019-07-24 ENCOUNTER — Other Ambulatory Visit: Payer: Self-pay | Admitting: Internal Medicine

## 2019-07-31 DIAGNOSIS — M4712 Other spondylosis with myelopathy, cervical region: Secondary | ICD-10-CM | POA: Diagnosis not present

## 2019-08-17 ENCOUNTER — Other Ambulatory Visit: Payer: Self-pay | Admitting: Internal Medicine

## 2019-08-17 NOTE — Telephone Encounter (Signed)
Please refill as per office routine med refill policy (all routine meds refilled for 3 mo or monthly per pt preference up to one year from last visit, then month to month grace period for 3 mo, then further med refills will have to be denied)  

## 2019-08-23 ENCOUNTER — Encounter: Payer: Self-pay | Admitting: Internal Medicine

## 2019-08-23 ENCOUNTER — Ambulatory Visit (INDEPENDENT_AMBULATORY_CARE_PROVIDER_SITE_OTHER): Payer: Medicare Other | Admitting: Internal Medicine

## 2019-08-23 ENCOUNTER — Other Ambulatory Visit: Payer: Self-pay

## 2019-08-23 VITALS — BP 100/80 | HR 58 | Temp 98.2°F | Ht 67.0 in | Wt 140.8 lb

## 2019-08-23 DIAGNOSIS — G894 Chronic pain syndrome: Secondary | ICD-10-CM

## 2019-08-23 DIAGNOSIS — F329 Major depressive disorder, single episode, unspecified: Secondary | ICD-10-CM | POA: Diagnosis not present

## 2019-08-23 DIAGNOSIS — F32A Depression, unspecified: Secondary | ICD-10-CM

## 2019-08-23 DIAGNOSIS — E559 Vitamin D deficiency, unspecified: Secondary | ICD-10-CM | POA: Diagnosis not present

## 2019-08-23 DIAGNOSIS — E538 Deficiency of other specified B group vitamins: Secondary | ICD-10-CM

## 2019-08-23 DIAGNOSIS — F411 Generalized anxiety disorder: Secondary | ICD-10-CM | POA: Diagnosis not present

## 2019-08-23 DIAGNOSIS — R269 Unspecified abnormalities of gait and mobility: Secondary | ICD-10-CM

## 2019-08-23 DIAGNOSIS — E785 Hyperlipidemia, unspecified: Secondary | ICD-10-CM

## 2019-08-23 DIAGNOSIS — Z0000 Encounter for general adult medical examination without abnormal findings: Secondary | ICD-10-CM

## 2019-08-23 DIAGNOSIS — N320 Bladder-neck obstruction: Secondary | ICD-10-CM

## 2019-08-23 HISTORY — DX: Depression, unspecified: F32.A

## 2019-08-23 LAB — CBC WITH DIFFERENTIAL/PLATELET
Basophils Absolute: 0 10*3/uL (ref 0.0–0.1)
Basophils Relative: 0.3 % (ref 0.0–3.0)
Eosinophils Absolute: 0.1 10*3/uL (ref 0.0–0.7)
Eosinophils Relative: 1.5 % (ref 0.0–5.0)
HCT: 49.9 % (ref 39.0–52.0)
Hemoglobin: 16.7 g/dL (ref 13.0–17.0)
Lymphocytes Relative: 47.5 % — ABNORMAL HIGH (ref 12.0–46.0)
Lymphs Abs: 2.2 10*3/uL (ref 0.7–4.0)
MCHC: 33.5 g/dL (ref 30.0–36.0)
MCV: 92.8 fl (ref 78.0–100.0)
Monocytes Absolute: 0.6 10*3/uL (ref 0.1–1.0)
Monocytes Relative: 12.8 % — ABNORMAL HIGH (ref 3.0–12.0)
Neutro Abs: 1.8 10*3/uL (ref 1.4–7.7)
Neutrophils Relative %: 37.9 % — ABNORMAL LOW (ref 43.0–77.0)
Platelets: 198 10*3/uL (ref 150.0–400.0)
RBC: 5.38 Mil/uL (ref 4.22–5.81)
RDW: 13.8 % (ref 11.5–15.5)
WBC: 4.7 10*3/uL (ref 4.0–10.5)

## 2019-08-23 LAB — HEPATIC FUNCTION PANEL
ALT: 18 U/L (ref 0–53)
AST: 16 U/L (ref 0–37)
Albumin: 4.5 g/dL (ref 3.5–5.2)
Alkaline Phosphatase: 68 U/L (ref 39–117)
Bilirubin, Direct: 0.2 mg/dL (ref 0.0–0.3)
Total Bilirubin: 1.2 mg/dL (ref 0.2–1.2)
Total Protein: 6.6 g/dL (ref 6.0–8.3)

## 2019-08-23 LAB — VITAMIN D 25 HYDROXY (VIT D DEFICIENCY, FRACTURES): VITD: 45.15 ng/mL (ref 30.00–100.00)

## 2019-08-23 LAB — BASIC METABOLIC PANEL
BUN: 23 mg/dL (ref 6–23)
CO2: 29 mEq/L (ref 19–32)
Calcium: 10 mg/dL (ref 8.4–10.5)
Chloride: 101 mEq/L (ref 96–112)
Creatinine, Ser: 0.83 mg/dL (ref 0.40–1.50)
GFR: 115.45 mL/min (ref >60.00)
Glucose, Bld: 90 mg/dL (ref 70–99)
Potassium: 4.2 mEq/L (ref 3.5–5.1)
Sodium: 136 mEq/L (ref 135–145)

## 2019-08-23 LAB — URINALYSIS, ROUTINE W REFLEX MICROSCOPIC
Bilirubin Urine: NEGATIVE
Hgb urine dipstick: NEGATIVE
Ketones, ur: NEGATIVE
Leukocytes,Ua: NEGATIVE
Nitrite: NEGATIVE
Specific Gravity, Urine: >=1.03 — AB (ref 1.000–1.030)
Total Protein, Urine: NEGATIVE
Urine Glucose: NEGATIVE
Urobilinogen, UA: 0.2 (ref 0.0–1.0)
pH: 5.5 (ref 5.0–8.0)

## 2019-08-23 LAB — LIPID PANEL
Cholesterol: 133 mg/dL (ref 0–200)
HDL: 46.9 mg/dL (ref >39.00)
LDL Cholesterol: 75 mg/dL (ref 0–99)
NonHDL: 86.24
Total CHOL/HDL Ratio: 3
Triglycerides: 58 mg/dL (ref 0.0–149.0)
VLDL: 11.6 mg/dL (ref 0.0–40.0)

## 2019-08-23 LAB — TSH: TSH: 1.46 u[IU]/mL (ref 0.35–4.50)

## 2019-08-23 LAB — VITAMIN B12: Vitamin B-12: 294 pg/mL (ref 211–911)

## 2019-08-23 LAB — PSA: PSA: 0.43 ng/mL (ref 0.10–4.00)

## 2019-08-23 MED ORDER — TAMSULOSIN HCL 0.4 MG PO CAPS: ORAL_CAPSULE | ORAL | 3 refills | Status: DC

## 2019-08-23 MED ORDER — ATORVASTATIN CALCIUM 20 MG PO TABS: ORAL_TABLET | ORAL | 3 refills | Status: DC

## 2019-08-23 MED ORDER — ALPRAZOLAM 1 MG PO TABS: 1.0000 mg | ORAL_TABLET | Freq: Two times a day (BID) | ORAL | 2 refills | Status: DC | PRN

## 2019-08-23 NOTE — Patient Instructions (Addendum)
Please call if you would want to start the celexa for nerves and depression  Please continue all other medications as before, and refills have been done if requested.  Please have the pharmacy call with any other refills you may need.  Please continue your efforts at being more active, low cholesterol diet, and weight control.  You are otherwise up to date with prevention measures today.  Please keep your appointments with your specialists as you may have planned  You will be contacted regarding the referral for: colonoscopy  Please go to the LAB at the blood drawing area for the tests to be done  You will be contacted by phone if any changes need to be made immediately.  Otherwise, you will receive a letter about your results with an explanation, but please check with MyChart first.  Please remember to sign up for MyChart if you have not done so, as this will be important to you in the future with finding out test results, communicating by private email, and scheduling acute appointments online when needed.  Please make an Appointment to return for your 1 year visit, or sooner if needed

## 2019-08-23 NOTE — Progress Notes (Signed)
Subjective:    Patient ID: Danny Hopkins, male    DOB: 02-Aug-1960, 59 y.o.   MRN: [id number]  HPI  Here to f/u; overall doing ok,  Pt denies chest pain, increasing sob or doe, wheezing, orthopnea, PND, increased LE swelling, palpitations, dizziness or syncope.  Pt denies new neurological symptoms such as new headache, or facial or extremity weakness or numbness.  Pt denies polydipsia, polyuria, or low sugar episode.  Pt states overall good compliance with meds, mostly trying to follow appropriate diet, with wt overall stable,  but little exercise however, has ongoing chronic back pain with spasm followed per neurology and pain management, now on oral baclofen but has declined the suggestion so far for implanted baclofen pump.  Has had mild worsening depressive symptoms, but no suicidal ideation, or panic; has ongoing anxiety, overall stable  Denies urinary symptoms such as dysuria, frequency, urgency, flank pain, hematuria or n/v, fever, chills. Past Medical History:  Diagnosis Date  . Arthritis   . Depression 08/23/2019  . History of chicken pox   . 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 Stroke [facility])    Past Surgical History:  Procedure Laterality Date  . cervical staph infection     2006  . INGUINAL HERNIA REPAIR     kindergarten    reports that he has never smoked. He has never used smokeless tobacco. He reports current alcohol use of about 2.0 standard drinks of alcohol per week. He reports current drug use. Drug: Marijuana. family history is not on file. No Known Allergies Current Outpatient Medications on File Prior to Visit  Medication Sig Dispense Refill  . clotrimazole-betamethasone (LOTRISONE) cream APPLY TO AFFECTED AREA TWICE A DAY 30 g 2  . HYDROcodone-acetaminophen (NORCO) 10-325 MG per tablet Take 1 tablet by mouth every 8 (eight) hours as needed.     Marland Kitchen SHINGRIX injection      No current facility-administered medications on file prior to visit.    Review of Systems All otherwise neg per pt     Objective:   Physical Exam BP 100/80 (BP Location: Left Arm, Patient Position: Sitting, Cuff Size: Large)   Pulse (!) 58   Temp 98.2 F (36.8 C) (Oral)   Ht 5\' 7"  (1.702 m)   Wt 140 lb 12.8 oz (63.9 kg)   SpO2 97%   BMI 22.05 kg/m  VS noted,  Constitutional: Pt appears in NAD HENT: Head: NCAT.  Right Ear: External ear normal.  Left Ear: External ear normal.  Eyes: . Pupils are equal, round, and reactive to light. Conjunctivae and EOM are normal Nose: without d/c or deformity Neck: Neck supple. Gross normal ROM Cardiovascular: Normal rate and regular rhythm.   Pulmonary/Chest: Effort normal and breath sounds without rales or wheezing.  Abd:  Soft, NT, ND, + BS, no organomegaly Neurological: Pt is alert. At baseline orientation, motor grossly intact Skin: Skin is warm. No rashes, other new lesions, no LE edema Psychiatric: Pt behavior is normal without agitation  All otherwise neg per pt Lab Results  Component Value Date   WBC 4.7 08/23/2019   HGB 16.7 08/23/2019   HCT 49.9 08/23/2019   PLT 198.0 08/23/2019   GLUCOSE 90 08/23/2019   CHOL 133 08/23/2019   TRIG 58.0 08/23/2019   HDL 46.90 08/23/2019   LDLCALC 75 08/23/2019   ALT 18 08/23/2019   AST 16 08/23/2019   NA 136 08/23/2019   K 4.2 08/23/2019   CL 101 08/23/2019  CREATININE 0.83 08/23/2019   BUN 23 08/23/2019   CO2 29 08/23/2019   TSH 1.46 08/23/2019   PSA 0.43 08/23/2019      Assessment & Plan:

## 2019-08-24 ENCOUNTER — Encounter: Payer: Self-pay | Admitting: Internal Medicine

## 2019-08-24 NOTE — Assessment & Plan Note (Signed)
Chronic post stroke with chronic spasticity, urged to reconsider the baclofen pump implantation

## 2019-08-24 NOTE — Assessment & Plan Note (Addendum)
Ongoing chronic stable, cont benzo prn  I spent 45 minutes in preparing to see the patient by review of recent labs, imaging and procedures, obtaining and reviewing separately obtained history, communicating with the patient and family or caregiver, ordering medications, tests or procedures, and documenting clinical information in the EHR including the differential Dx, treatment, and any further evaluation and other management of anxiety, depression, chronic pain, neurologic gait disorder, HLD, due for f/u psa, vit d deficiency

## 2019-08-24 NOTE — Assessment & Plan Note (Signed)
Continue f/u pain management

## 2019-08-24 NOTE — Assessment & Plan Note (Signed)
stable overall by history and exam, recent data reviewed with pt, and pt to continue medical treatment as before,  to f/u any worsening symptoms or concerns  

## 2019-08-24 NOTE — Assessment & Plan Note (Signed)
Also for psa with labs

## 2019-08-24 NOTE — Assessment & Plan Note (Signed)
For oral replacement 

## 2019-08-24 NOTE — Assessment & Plan Note (Signed)
Declines celexa for now,  to f/u any worsening symptoms or concerns

## 2019-08-27 ENCOUNTER — Encounter: Payer: Self-pay | Admitting: Internal Medicine

## 2019-10-09 ENCOUNTER — Other Ambulatory Visit: Payer: Self-pay

## 2019-10-09 ENCOUNTER — Ambulatory Visit (AMBULATORY_SURGERY_CENTER): Payer: Self-pay

## 2019-10-09 ENCOUNTER — Encounter: Payer: Self-pay | Admitting: Internal Medicine

## 2019-10-09 VITALS — Ht 67.0 in | Wt 140.0 lb

## 2019-10-09 DIAGNOSIS — Z86010 Personal history of colonic polyps: Secondary | ICD-10-CM

## 2019-10-09 MED ORDER — SUTAB 1479-225-188 MG PO TABS: 1.0000 | ORAL_TABLET | ORAL | 0 refills | Status: DC

## 2019-10-09 NOTE — Progress Notes (Signed)
No egg or soy allergy known to patient  No issues with past sedation with any surgeries or procedures No intubation problems in the past  No diet pills per patient No home 02 use per patient  No blood thinners per patient  Pt denies issues with constipation  No A fib or A flutter  EMMI video to pt or MyChart  COVID 19 guidelines implemented in [facility] today   Coupon given to pt in [facility] today   COVID vaccine completed on 06/2019 per pt;   Due to the COVID-19 pandemic we are asking patients to follow these guidelines. Please only bring one care partner. Please be aware that your care partner may wait in the car in the parking lot or if they feel like they will be too hot to wait in the car, they may wait in the lobby on the 4th floor. All care partners are required to wear a mask the entire time (we do not have any that we can provide them), they need to practice social distancing, and we will do a Covid check for all patient's and care partners when you arrive. Also we will check their temperature and your temperature. If the care partner waits in their car they need to stay in the parking lot the entire time and we will call them on their cell phone when the patient is ready for discharge so they can bring the car to the front of the building. Also all patient's will need to wear a mask into building.

## 2019-10-14 DIAGNOSIS — G8929 Other chronic pain: Secondary | ICD-10-CM | POA: Diagnosis not present

## 2019-10-14 DIAGNOSIS — M549 Dorsalgia, unspecified: Secondary | ICD-10-CM | POA: Diagnosis not present

## 2019-10-14 DIAGNOSIS — Z8673 Personal history of transient ischemic attack (TIA), and cerebral infarction without residual deficits: Secondary | ICD-10-CM | POA: Diagnosis not present

## 2019-10-14 DIAGNOSIS — K5900 Constipation, unspecified: Secondary | ICD-10-CM | POA: Diagnosis not present

## 2019-10-14 DIAGNOSIS — N400 Enlarged prostate without lower urinary tract symptoms: Secondary | ICD-10-CM | POA: Diagnosis not present

## 2019-10-14 DIAGNOSIS — E785 Hyperlipidemia, unspecified: Secondary | ICD-10-CM | POA: Diagnosis not present

## 2019-10-14 DIAGNOSIS — Z9181 History of falling: Secondary | ICD-10-CM | POA: Diagnosis not present

## 2019-10-14 DIAGNOSIS — R269 Unspecified abnormalities of gait and mobility: Secondary | ICD-10-CM | POA: Diagnosis not present

## 2019-10-22 ENCOUNTER — Encounter: Payer: Self-pay | Admitting: Certified Registered Nurse Anesthetist

## 2019-10-23 ENCOUNTER — Encounter: Payer: Self-pay | Admitting: Internal Medicine

## 2019-10-23 ENCOUNTER — Other Ambulatory Visit: Payer: Self-pay

## 2019-10-23 ENCOUNTER — Ambulatory Visit (AMBULATORY_SURGERY_CENTER): Payer: Medicare HMO | Admitting: Internal Medicine

## 2019-10-23 VITALS — BP 118/67 | HR 63 | Temp 96.8°F | Resp 16 | Ht 67.0 in | Wt 140.0 lb

## 2019-10-23 DIAGNOSIS — D123 Benign neoplasm of transverse colon: Secondary | ICD-10-CM | POA: Diagnosis not present

## 2019-10-23 DIAGNOSIS — Z86010 Personal history of colonic polyps: Secondary | ICD-10-CM

## 2019-10-23 DIAGNOSIS — D122 Benign neoplasm of ascending colon: Secondary | ICD-10-CM

## 2019-10-23 MED ORDER — SODIUM CHLORIDE 0.9 % IV SOLN: 500.0000 mL | Freq: Once | INTRAVENOUS | Status: DC

## 2019-10-23 NOTE — Op Note (Signed)
[facility] Patient Name: Danny Hopkins Procedure Date: 10/23/2019 10:13 AM MRN: [id number] Endoscopist: Jerene Bears , MD Age: 59 Referring MD:  Date of Birth: 28-Dec-1960 Gender: Male Account #: [id number] Procedure:                Colonoscopy Indications:              High risk colon cancer surveillance: Personal                            history of non-advanced adenoma and sessile                            serrated colon polyp, Last colonoscopy: June 2015 Medicines:                Monitored Anesthesia Care Procedure:                Pre-Anesthesia Assessment:                           - Prior to the procedure, a History and Physical                            was performed, and patient medications and                            allergies were reviewed. The patient's tolerance of                            previous anesthesia was also reviewed. The risks                            and benefits of the procedure and the sedation                            options and risks were discussed with the patient.                            All questions were answered, and informed consent                            was obtained. Prior Anticoagulants: The patient has                            taken no previous anticoagulant or antiplatelet                            agents. ASA Grade Assessment: III - A patient with                            severe systemic disease. After reviewing the risks                            and benefits, the patient was deemed in  satisfactory condition to undergo the procedure.                           After obtaining informed consent, the colonoscope                            was passed under direct vision. Throughout the                            procedure, the patient's blood pressure, pulse, and                            oxygen saturations were monitored continuously. The                            Colonoscope was  introduced through the anus and                            advanced to the cecum, identified by appendiceal                            orifice and ileocecal valve. The colonoscopy was                            performed without difficulty. The patient tolerated                            the procedure well. The quality of the bowel                            preparation was good. The ileocecal valve,                            appendiceal orifice, and rectum were photographed. Scope In: 10:27:40 AM Scope Out: 10:44:13 AM Scope Withdrawal Time: 0 hours 11 minutes 47 seconds  Total Procedure Duration: 0 hours 16 minutes 33 seconds  Findings:                 The digital rectal exam was normal.                           A 4 mm polyp was found in the ascending colon. The                            polyp was sessile. The polyp was removed with a                            cold snare. Resection and retrieval were complete.                           A 5 mm polyp was found in the transverse colon. The                            polyp was  sessile. The polyp was removed with a                            cold snare. Resection and retrieval were complete.                           Internal hemorrhoids were found during                            retroflexion. The hemorrhoids were small. Complications:            No immediate complications. Estimated Blood Loss:     Estimated blood loss was minimal. Impression:               - One 4 mm polyp in the ascending colon, removed                            with a cold snare. Resected and retrieved.                           - One 5 mm polyp in the transverse colon, removed                            with a cold snare. Resected and retrieved.                           - Internal hemorrhoids. Recommendation:           - Patient has a contact number available for                            emergencies. The signs and symptoms of potential                             delayed complications were discussed with the                            patient. Return to normal activities tomorrow.                            Written discharge instructions were provided to the                            patient.                           - Resume previous diet.                           - Continue present medications.                           - Await pathology results.                           - Repeat colonoscopy is recommended for  surveillance. The colonoscopy date will be                            determined after pathology results from today's                            exam become available for review. Jerene Bears, MD 10/23/2019 10:48:34 AM This report has been signed electronically.

## 2019-10-23 NOTE — Patient Instructions (Signed)
Handout on polyps and hemorrhoids given.    YOU HAD AN ENDOSCOPIC PROCEDURE TODAY AT THE [facility]:   Refer to the procedure report that was given to you for any specific questions about what was found during the examination.  If the procedure report does not answer your questions, please call your gastroenterologist to clarify.  If you requested that your care partner not be given the details of your procedure findings, then the procedure report has been included in a sealed envelope for you to review at your convenience later.  YOU SHOULD EXPECT: Some feelings of bloating in the abdomen. Passage of more gas than usual.  Walking can help get rid of the air that was put into your GI tract during the procedure and reduce the bloating. If you had a lower endoscopy (such as a colonoscopy or flexible sigmoidoscopy) you may notice spotting of blood in your stool or on the toilet paper. If you underwent a bowel prep for your procedure, you may not have a normal bowel movement for a few days.  Please Note:  You might notice some irritation and congestion in your nose or some drainage.  This is from the oxygen used during your procedure.  There is no need for concern and it should clear up in a day or so.  SYMPTOMS TO REPORT IMMEDIATELY:  Following lower endoscopy (colonoscopy or flexible sigmoidoscopy):  Excessive amounts of blood in the stool  Significant tenderness or worsening of abdominal pains  Swelling of the abdomen that is new, acute  Fever of 100F or higher   For urgent or emergent issues, a gastroenterologist can be reached at any hour by calling [phone]. Do not use MyChart messaging for urgent concerns.    DIET:  We do recommend a small meal at first, but then you may proceed to your regular diet.  Drink plenty of fluids but you should avoid alcoholic beverages for 24 hours.  ACTIVITY:  You should plan to take it easy for the rest of today and you should NOT DRIVE  or use heavy machinery until tomorrow (because of the sedation medicines used during the test).    FOLLOW UP: Our staff will call the number listed on your records 48-72 hours following your procedure to check on you and address any questions or concerns that you may have regarding the information given to you following your procedure. If we do not reach you, we will leave a message.  We will attempt to reach you two times.  During this call, we will ask if you have developed any symptoms of COVID 19. If you develop any symptoms (ie: fever, flu-like symptoms, shortness of breath, cough etc.) before then, please call [phone].  If you test positive for Covid 19 in the 2 weeks post procedure, please call and report this information to us.    If any biopsies were taken you will be contacted by phone or by letter within the next 1-3 weeks.  Please call us at [phone] if you have not heard about the biopsies in 3 weeks.    SIGNATURES/CONFIDENTIALITY: You and/or your care partner have signed paperwork which will be entered into your electronic medical record.  These signatures attest to the fact that that the information above on your After Visit Summary has been reviewed and is understood.  Full responsibility of the confidentiality of this discharge information lies with you and/or your care-partner.  

## 2019-10-23 NOTE — Progress Notes (Signed)
Report given to PACU, vss 

## 2019-10-23 NOTE — Progress Notes (Signed)
Ephedrine 10 mg given IV due to low BP and low HR, MD updated.

## 2019-10-23 NOTE — Progress Notes (Signed)
Patient had pre visit state no change

## 2019-10-25 ENCOUNTER — Telehealth: Payer: Self-pay | Admitting: *Deleted

## 2019-10-25 NOTE — Telephone Encounter (Signed)
All questions answered

## 2019-10-25 NOTE — Telephone Encounter (Signed)
°  Follow up Call-  Call back number 10/23/2019  Post procedure Call Back phone  # [phone]  Permission to leave phone message Yes  Some recent data might be hidden     Patient questions:  Do you have a fever, pain , or abdominal swelling? No. Pain Score  0 *  Have you tolerated food without any problems? Yes.    Have you been able to return to your normal activities? Yes.    Do you have any questions about your discharge instructions: Diet   No. Medications  No. Follow up visit  No.  Do you have questions or concerns about your Care? No.  Actions: * If pain score is 4 or above: No action needed, pain <4.  1. Have you developed a fever since your procedure? no  2.   Have you had an respiratory symptoms (SOB or cough) since your procedure? no  3.   Have you tested positive for COVID 19 since your procedure no  4.   Have you had any family members/close contacts diagnosed with the COVID 19 since your procedure?  no   If yes to any of these questions please route to Joylene John, RN and Erenest Rasher, RN

## 2019-10-25 NOTE — Telephone Encounter (Signed)
Patient is calling back is requesting to speak with you again.

## 2019-10-29 ENCOUNTER — Encounter: Payer: Self-pay | Admitting: Internal Medicine

## 2019-11-26 ENCOUNTER — Other Ambulatory Visit: Payer: Self-pay | Admitting: Internal Medicine

## 2019-11-26 NOTE — Telephone Encounter (Signed)
Done erx 

## 2019-12-04 DIAGNOSIS — M4712 Other spondylosis with myelopathy, cervical region: Secondary | ICD-10-CM | POA: Diagnosis not present

## 2019-12-18 DIAGNOSIS — M21962 Unspecified acquired deformity of left lower leg: Secondary | ICD-10-CM | POA: Diagnosis not present

## 2019-12-18 DIAGNOSIS — M21961 Unspecified acquired deformity of right lower leg: Secondary | ICD-10-CM | POA: Diagnosis not present

## 2019-12-18 DIAGNOSIS — M19071 Primary osteoarthritis, right ankle and foot: Secondary | ICD-10-CM | POA: Diagnosis not present

## 2019-12-18 DIAGNOSIS — R262 Difficulty in walking, not elsewhere classified: Secondary | ICD-10-CM | POA: Diagnosis not present

## 2020-01-07 ENCOUNTER — Ambulatory Visit (INDEPENDENT_AMBULATORY_CARE_PROVIDER_SITE_OTHER): Payer: Medicare HMO | Admitting: Internal Medicine

## 2020-01-07 ENCOUNTER — Encounter: Payer: Self-pay | Admitting: Internal Medicine

## 2020-01-07 ENCOUNTER — Other Ambulatory Visit: Payer: Self-pay

## 2020-01-07 DIAGNOSIS — F32A Depression, unspecified: Secondary | ICD-10-CM | POA: Diagnosis not present

## 2020-01-07 DIAGNOSIS — R69 Illness, unspecified: Secondary | ICD-10-CM | POA: Diagnosis not present

## 2020-01-07 DIAGNOSIS — F411 Generalized anxiety disorder: Secondary | ICD-10-CM

## 2020-01-07 DIAGNOSIS — G43909 Migraine, unspecified, not intractable, without status migrainosus: Secondary | ICD-10-CM | POA: Insufficient documentation

## 2020-01-07 DIAGNOSIS — G43809 Other migraine, not intractable, without status migrainosus: Secondary | ICD-10-CM

## 2020-01-07 MED ORDER — OMEPRAZOLE 20 MG PO CPDR: 20.0000 mg | DELAYED_RELEASE_CAPSULE | Freq: Every day | ORAL | 11 refills | Status: DC | PRN

## 2020-01-07 MED ORDER — NAPROXEN 500 MG PO TABS
500.0000 mg | ORAL_TABLET | Freq: Two times a day (BID) | ORAL | 2 refills | Status: DC | PRN
Start: 2020-01-07 — End: 2020-05-26

## 2020-01-07 MED ORDER — SUMATRIPTAN SUCCINATE 100 MG PO TABS: 100.0000 mg | ORAL_TABLET | ORAL | 11 refills | Status: AC | PRN

## 2020-01-07 NOTE — Progress Notes (Signed)
Subjective:    Patient ID: Danny Hopkins, male    DOB: 1960/08/15, 59 y.o.   MRN: [id number]  HPI  Here with c/o 4 days onset right sided throbbing HA with some blurry vision, nausea without vomiting, worse to bending and going up stairs, some better in the AM, worse in the PM again.  Pt denies new neurological symptoms such as new facial or extremity weakness or numbness  Pt denies chest pain, increased sob or doe, wheezing, orthopnea, PND, increased LE swelling, palpitations, dizziness or syncope.   Pt denies polydipsia, polyuria  Denies worsening depressive symptoms, suicidal ideation, or panic Past Medical History:  Diagnosis Date  . Anxiety    07/2019  . Arthritis   . Depression 08/23/2019  . History of chicken pox   . Hyperlipidemia    2015  . 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 Stroke [facility])    2006   Past Surgical History:  Procedure Laterality Date  . cervical staph infection     2006  . COLONOSCOPY     2015  . INGUINAL HERNIA REPAIR     kindergarten    reports that he has never smoked. He has never used smokeless tobacco. He reports current alcohol use of about 2.0 standard drinks of alcohol per week. He reports previous drug use. Drug: Marijuana. family history is not on file. No Known Allergies Current Outpatient Medications on File Prior to Visit  Medication Sig Dispense Refill  . ALPRAZolam (XANAX) 1 MG tablet TAKE 1 TABLET (1 MG TOTAL) BY MOUTH 2 (TWO) TIMES DAILY AS NEEDED. 60 tablet 2  . atorvastatin (LIPITOR) 20 MG tablet TAKE 1 TABLET BY MOUTH EVERY DAy 90 tablet 3  . baclofen (LIORESAL) 10 MG tablet Take by mouth every 4 (four) hours.    Marland Kitchen HYDROcodone-acetaminophen (NORCO) 10-325 MG per tablet Take 1 tablet by mouth every 8 (eight) hours as needed.     Marland Kitchen SHINGRIX injection     . tamsulosin (FLOMAX) 0.4 MG CAPS capsule TAKE 1 CAPSULE BY MOUTH EVERY DAY 90 capsule 3   No current facility-administered medications on file prior to  visit.   Review of Systems All otherwise neg per pt    Objective:   Physical Exam BP 120/60 (BP Location: Left Arm, Patient Position: Sitting, Cuff Size: Large)   Pulse (!) 48   Temp 98.6 F (37 C) (Oral)   Ht 5\' 7"  (1.702 m)   Wt 139 lb (63 kg)   SpO2 92%   BMI 21.77 kg/m   VS noted,  Constitutional: Pt appears in NAD HENT: Head: NCAT.  Right Ear: External ear normal.  Left Ear: External ear normal.  Eyes: . Pupils are equal, round, and reactive to light. Conjunctivae and EOM are normal Nose: without d/c or deformity Neck: Neck supple. Gross normal ROM Cardiovascular: Normal rate and regular rhythm.   Pulmonary/Chest: Effort normal and breath sounds without rales or wheezing.  Neurological: Pt is alert. At baseline orientation, motor grossly intact, cn 2-1 2 intact Skin: Skin is warm. No rashes, other new lesions, no LE edema Psychiatric: Pt behavior is normal without agitation  All otherwise neg per pt Lab Results  Component Value Date   WBC 4.7 08/23/2019   HGB 16.7 08/23/2019   HCT 49.9 08/23/2019   PLT 198.0 08/23/2019   GLUCOSE 90 08/23/2019   CHOL 133 08/23/2019   TRIG 58.0 08/23/2019   HDL 46.90 08/23/2019   LDLCALC 75 08/23/2019  ALT 18 08/23/2019   AST 16 08/23/2019   NA 136 08/23/2019   K 4.2 08/23/2019   CL 101 08/23/2019   CREATININE 0.83 08/23/2019   BUN 23 08/23/2019   CO2 29 08/23/2019   TSH 1.46 08/23/2019   PSA 0.43 08/23/2019      Assessment & Plan:

## 2020-01-07 NOTE — Patient Instructions (Signed)
Please take all new medication as prescribed - the imitrex and naproxyn for the Headaches as needed  Please also take a prilosec when you take a naproxyn as well to prevent stomach issues  Please continue all other medications as before, and refills have been done if requested.  Please have the pharmacy call with any other refills you may need.  Please continue your efforts at being more active, low cholesterol diet, and weight control.  Please keep your appointments with your specialists as you may have planned

## 2020-01-13 ENCOUNTER — Other Ambulatory Visit: Payer: Self-pay | Admitting: Internal Medicine

## 2020-01-13 DIAGNOSIS — L309 Dermatitis, unspecified: Secondary | ICD-10-CM

## 2020-01-15 DIAGNOSIS — M21962 Unspecified acquired deformity of left lower leg: Secondary | ICD-10-CM | POA: Diagnosis not present

## 2020-01-15 DIAGNOSIS — L84 Corns and callosities: Secondary | ICD-10-CM | POA: Diagnosis not present

## 2020-01-15 DIAGNOSIS — M19071 Primary osteoarthritis, right ankle and foot: Secondary | ICD-10-CM | POA: Diagnosis not present

## 2020-01-15 DIAGNOSIS — M21961 Unspecified acquired deformity of right lower leg: Secondary | ICD-10-CM | POA: Diagnosis not present

## 2020-01-19 ENCOUNTER — Encounter: Payer: Self-pay | Admitting: Internal Medicine

## 2020-01-19 NOTE — Assessment & Plan Note (Addendum)
Mild to mod, for imitrex and naproxn prn,  to f/u any worsening symptoms or concerns  I spent 31 minutes in preparing to see the patient by review of recent labs, imaging and procedures, obtaining and reviewing separately obtained history, communicating with the patient and family or caregiver, ordering medications, tests or procedures, and documenting clinical information in the EHR including the differential Dx, treatment, and any further evaluation and other management of migraine, anxiety, depression

## 2020-01-19 NOTE — Assessment & Plan Note (Signed)
stable overall by history and exam, recent data reviewed with pt, and pt to continue medical treatment as before,  to f/u any worsening symptoms or concerns  

## 2020-01-21 DIAGNOSIS — M21961 Unspecified acquired deformity of right lower leg: Secondary | ICD-10-CM | POA: Diagnosis not present

## 2020-01-21 DIAGNOSIS — L84 Corns and callosities: Secondary | ICD-10-CM | POA: Diagnosis not present

## 2020-01-21 DIAGNOSIS — M21962 Unspecified acquired deformity of left lower leg: Secondary | ICD-10-CM | POA: Diagnosis not present

## 2020-01-21 DIAGNOSIS — M19071 Primary osteoarthritis, right ankle and foot: Secondary | ICD-10-CM | POA: Diagnosis not present

## 2020-02-10 ENCOUNTER — Telehealth: Payer: Self-pay | Admitting: Internal Medicine

## 2020-02-10 NOTE — Telephone Encounter (Signed)
Follow up message   Patient requesting antidepressant be called to pharmacy  Appointment scheduled for 11/16 (please advise if appointment needs to be canceled)

## 2020-02-10 NOTE — Telephone Encounter (Signed)
Patient called and was wondering if something could be called in for his depression. He said that it is hard for him to get up and go and do things, he said that he is also having a loss of appetite. The medication can be sent to  [id number] - [place], [place]   Please call the patient if rx is sent [phone]

## 2020-02-10 NOTE — Telephone Encounter (Signed)
Sent to Dr. John. 

## 2020-02-10 NOTE — Telephone Encounter (Signed)
Ok to see at [facility]

## 2020-02-10 NOTE — Telephone Encounter (Signed)
Sent to Dr. John to advise. 

## 2020-02-11 ENCOUNTER — Ambulatory Visit (INDEPENDENT_AMBULATORY_CARE_PROVIDER_SITE_OTHER): Payer: Medicare HMO | Admitting: Internal Medicine

## 2020-02-11 ENCOUNTER — Other Ambulatory Visit: Payer: Self-pay

## 2020-02-11 ENCOUNTER — Encounter: Payer: Self-pay | Admitting: Internal Medicine

## 2020-02-11 ENCOUNTER — Telehealth: Payer: Self-pay | Admitting: Internal Medicine

## 2020-02-11 VITALS — BP 110/60 | HR 76 | Temp 98.7°F | Ht 67.0 in | Wt 134.0 lb

## 2020-02-11 DIAGNOSIS — F5101 Primary insomnia: Secondary | ICD-10-CM

## 2020-02-11 DIAGNOSIS — F411 Generalized anxiety disorder: Secondary | ICD-10-CM | POA: Diagnosis not present

## 2020-02-11 DIAGNOSIS — R69 Illness, unspecified: Secondary | ICD-10-CM | POA: Diagnosis not present

## 2020-02-11 DIAGNOSIS — F32A Depression, unspecified: Secondary | ICD-10-CM

## 2020-02-11 DIAGNOSIS — G43809 Other migraine, not intractable, without status migrainosus: Secondary | ICD-10-CM | POA: Diagnosis not present

## 2020-02-11 MED ORDER — ALPRAZOLAM 1 MG PO TABS: ORAL_TABLET | ORAL | 2 refills | Status: DC

## 2020-02-11 MED ORDER — PAROXETINE HCL 10 MG PO TABS
10.0000 mg | ORAL_TABLET | Freq: Every day | ORAL | 3 refills | Status: DC
Start: 2020-02-11 — End: 2021-01-21

## 2020-02-11 NOTE — Patient Instructions (Signed)
Please take all new medication as prescribed - the paxil 10 mg  Please call in 3-4 wks if you would want the higher 20 mg dose  Please continue all other medications as discussed - the xanax  Please have the pharmacy call with any other refills you may need.  Please continue your efforts at being more active, low cholesterol diet, and weight control.  Please keep your appointments with your specialists as you may have planned

## 2020-02-11 NOTE — Progress Notes (Signed)
Subjective:    Patient ID: Danny Hopkins, male    DOB: 23-Apr-1960, 59 y.o.   MRN: [id number]  HPI  Here to f/u; overall doing ok,  Pt denies chest pain, increasing sob or doe, wheezing, orthopnea, PND, increased LE swelling, palpitations, dizziness or syncope.  Pt denies new neurological symptoms such as new headache, or facial or extremity weakness or numbness.  Pt denies polydipsia, polyuria, or low sugar episode.  Pt states overall good compliance with meds, mostly trying to follow appropriate diet, with wt overall stable,  but little exercise however. But Lost 6 lbs with lower mood, and c/o worsening depression symptoms x 3-4 wks without SI or HI, but has worsening sleep as well. Wt Readings from Last 3 Encounters:  02/11/20 134 lb (60.8 kg)  01/07/20 139 lb (63 kg)  10/23/19 140 lb (63.5 kg)   Past Medical History:  Diagnosis Date  . Anxiety    07/2019  . Arthritis   . Depression 08/23/2019  . History of chicken pox   . Hyperlipidemia    2015  . 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 Stroke [facility])    2006   Past Surgical History:  Procedure Laterality Date  . cervical staph infection     2006  . COLONOSCOPY     2015  . INGUINAL HERNIA REPAIR     kindergarten    reports that he has never smoked. He has never used smokeless tobacco. He reports current alcohol use of about 2.0 standard drinks of alcohol per week. He reports previous drug use. Drug: Marijuana. family history is not on file. No Known Allergies Current Outpatient Medications on File Prior to Visit  Medication Sig Dispense Refill  . atorvastatin (LIPITOR) 20 MG tablet TAKE 1 TABLET BY MOUTH EVERY DAy 90 tablet 3  . baclofen (LIORESAL) 10 MG tablet Take by mouth every 4 (four) hours.    . clotrimazole-betamethasone (LOTRISONE) cream APPLY TO AFFECTED AREA TWICE A DAY 30 g 2  . HYDROcodone-acetaminophen (NORCO) 10-325 MG per tablet Take 1 tablet by mouth every 8 (eight) hours as needed.      . naproxen (NAPROSYN) 500 MG tablet Take 1 tablet (500 mg total) by mouth 2 (two) times daily as needed for headache. 60 tablet 2  . omeprazole (PRILOSEC) 20 MG capsule Take 1 capsule (20 mg total) by mouth daily as needed. 30 capsule 11  . SHINGRIX injection     . SUMAtriptan (IMITREX) 100 MG tablet Take 1 tablet (100 mg total) by mouth every 2 (two) hours as needed for migraine or headache. May repeat in 2 hours if headache persists or recurs. 10 tablet 11  . tamsulosin (FLOMAX) 0.4 MG CAPS capsule TAKE 1 CAPSULE BY MOUTH EVERY DAY 90 capsule 3   No current facility-administered medications on file prior to visit.   Review of Systems All otherwise neg per pt    Objective:   Physical Exam BP 110/60 (BP Location: Left Arm, Patient Position: Sitting, Cuff Size: Large)   Pulse 76   Temp 98.7 F (37.1 C) (Oral)   Ht 5\' 7"  (1.702 m)   Wt 134 lb (60.8 kg)   SpO2 99%   BMI 20.99 kg/m  VS noted,  Constitutional: Pt appears in NAD HENT: Head: NCAT.  Right Ear: External ear normal.  Left Ear: External ear normal.  Eyes: . Pupils are equal, round, and reactive to light. Conjunctivae and EOM are normal Nose: without d/c or deformity  Neck: Neck supple. Gross normal ROM Cardiovascular: Normal rate and regular rhythm.   Pulmonary/Chest: Effort normal and breath sounds without rales or wheezing.  Neurological: Pt is alert. At baseline orientation, motor grossly intact Skin: Skin is warm. No rashes, other new lesions, no LE edema Psychiatric: Pt behavior is normal without agitation but depressed and nervous affect All otherwise neg per pt Lab Results  Component Value Date   WBC 4.7 08/23/2019   HGB 16.7 08/23/2019   HCT 49.9 08/23/2019   PLT 198.0 08/23/2019   GLUCOSE 90 08/23/2019   CHOL 133 08/23/2019   TRIG 58.0 08/23/2019   HDL 46.90 08/23/2019   LDLCALC 75 08/23/2019   ALT 18 08/23/2019   AST 16 08/23/2019   NA 136 08/23/2019   K 4.2 08/23/2019   CL 101 08/23/2019    CREATININE 0.83 08/23/2019   BUN 23 08/23/2019   CO2 29 08/23/2019   TSH 1.46 08/23/2019   PSA 0.43 08/23/2019      Assessment & Plan:

## 2020-02-11 NOTE — Telephone Encounter (Signed)
Patient unsure about the two medications he was prescribed today by Dr. Jenny Reichmann and wanted to talk to someone about them.

## 2020-02-12 NOTE — Telephone Encounter (Signed)
Tried calling pt to get clarity on exactly what his questions are about his meds he was rx'd on 02/10/2020.  **Pt phone went to VM and I was unable to lvm due to pts vm being full.

## 2020-02-12 NOTE — Telephone Encounter (Signed)
    Patient calling with questing regarding PARoxetine (PAXIL) 10 MG tablet Patient is concerned about possible weight loss.

## 2020-02-13 NOTE — Telephone Encounter (Signed)
Patient requesting return call °

## 2020-02-13 NOTE — Telephone Encounter (Signed)
   Patient states when he read up on about paxil it stated it makes people lose weight and he had talked to Dr. Jenny Reichmann about getting on medication that will help him eat, gain weight, and help with his depression. Patient has not started this medication because he is worried about the potential weight loss and doesn't want to start if he will just have to stop it.

## 2020-02-13 NOTE — Telephone Encounter (Signed)
Sent to Dr. John to advise. 

## 2020-02-13 NOTE — Telephone Encounter (Signed)
No, remember the information they list is a legal thing to protect the company and often has nothing to do with real life common side effects  Ok to take the paxil.  It has been out since 1996 and we have used this for many patients, and no one loses wt; they only gain wt as we mentioned

## 2020-02-14 NOTE — Telephone Encounter (Signed)
    Patient made aware of Dr Judi Cong message regarding Paxil

## 2020-02-16 ENCOUNTER — Encounter: Payer: Self-pay | Admitting: Internal Medicine

## 2020-02-16 NOTE — Assessment & Plan Note (Addendum)
With recent wt loss, d/w pt options such as celexa, cymbalta, effexor but will go with paxil 10 qd as this should assist with wt gain as well, consider increase to 20 mg in 3-4 wks  I spent 31 minutes in preparing to see the patient by review of recent labs, imaging and procedures, obtaining and reviewing separately obtained history, communicating with the patient and family or caregiver, ordering medications, tests or procedures, and documenting clinical information in the EHR including the differential Dx, treatment, and any further evaluation and other management of depression, anxiety, insomnia, migriaines

## 2020-02-16 NOTE — Assessment & Plan Note (Signed)
Ok for xanax 1 mg in am and 1.5 mg qhs prn

## 2020-02-16 NOTE — Assessment & Plan Note (Signed)
Ok for use slightly increased xanax qhs to assist with sleep

## 2020-02-16 NOTE — Assessment & Plan Note (Signed)
stable persistent overall, recent data reviewed with pt, and pt to continue medical treatment as before,  to f/u any worsening symptoms or concerns

## 2020-02-25 ENCOUNTER — Other Ambulatory Visit: Payer: Self-pay | Admitting: Internal Medicine

## 2020-03-11 DIAGNOSIS — Z23 Encounter for immunization: Secondary | ICD-10-CM | POA: Diagnosis not present

## 2020-03-16 DIAGNOSIS — M21961 Unspecified acquired deformity of right lower leg: Secondary | ICD-10-CM | POA: Diagnosis not present

## 2020-03-16 DIAGNOSIS — M19071 Primary osteoarthritis, right ankle and foot: Secondary | ICD-10-CM | POA: Diagnosis not present

## 2020-03-16 DIAGNOSIS — M21962 Unspecified acquired deformity of left lower leg: Secondary | ICD-10-CM | POA: Diagnosis not present

## 2020-03-16 DIAGNOSIS — M21379 Foot drop, unspecified foot: Secondary | ICD-10-CM | POA: Diagnosis not present

## 2020-03-23 DIAGNOSIS — M79672 Pain in left foot: Secondary | ICD-10-CM | POA: Diagnosis not present

## 2020-03-23 DIAGNOSIS — B079 Viral wart, unspecified: Secondary | ICD-10-CM | POA: Diagnosis not present

## 2020-03-23 DIAGNOSIS — M79671 Pain in right foot: Secondary | ICD-10-CM | POA: Diagnosis not present

## 2020-03-23 DIAGNOSIS — M2041 Other hammer toe(s) (acquired), right foot: Secondary | ICD-10-CM | POA: Diagnosis not present

## 2020-03-23 DIAGNOSIS — M65871 Other synovitis and tenosynovitis, right ankle and foot: Secondary | ICD-10-CM | POA: Diagnosis not present

## 2020-04-02 DIAGNOSIS — M4712 Other spondylosis with myelopathy, cervical region: Secondary | ICD-10-CM | POA: Diagnosis not present

## 2020-04-13 ENCOUNTER — Telehealth: Payer: Self-pay | Admitting: Internal Medicine

## 2020-04-16 DIAGNOSIS — B079 Viral wart, unspecified: Secondary | ICD-10-CM | POA: Diagnosis not present

## 2020-04-17 ENCOUNTER — Other Ambulatory Visit: Payer: Self-pay

## 2020-04-17 ENCOUNTER — Ambulatory Visit (INDEPENDENT_AMBULATORY_CARE_PROVIDER_SITE_OTHER): Payer: Medicare HMO | Admitting: Internal Medicine

## 2020-04-17 DIAGNOSIS — F32A Depression, unspecified: Secondary | ICD-10-CM | POA: Diagnosis not present

## 2020-04-17 DIAGNOSIS — F411 Generalized anxiety disorder: Secondary | ICD-10-CM

## 2020-04-17 DIAGNOSIS — R69 Illness, unspecified: Secondary | ICD-10-CM | POA: Diagnosis not present

## 2020-04-17 DIAGNOSIS — M79602 Pain in left arm: Secondary | ICD-10-CM

## 2020-04-17 NOTE — Progress Notes (Signed)
Established Patient Office Visit  Subjective:  Patient ID: Danny Hopkins, male    DOB: Jun 29, 1960  Age: 60 y.o. MRN: [id number]       Chief Complaint:  Left upper arm pain       HPI:  Danny Hopkins is a 60 y.o. male here with c/o left upper arm pain, sharp, mild but now mod to severe in the past wk, constant but worse to abduct the arm, less o with forward elevation, without trauma, fall , fever, red, swelling.  Nothing else seems to make better or worse.  Pt denies chest pain, increased sob or doe, wheezing, orthopnea, PND, increased LE swelling, palpitations, dizziness or syncope.   Pt denies polydipsia, polyuria,  Denies worsening depressive symptoms, suicidal ideation, or panic, but has chronic mild low grade depressive symptoms but declines specific tx today .       Wt Readings from Last 3 Encounters:  04/17/20 137 lb (62.1 kg)  02/11/20 134 lb (60.8 kg)  01/07/20 139 lb (63 kg)   BP Readings from Last 3 Encounters:  04/17/20 110/76  02/11/20 110/60  01/07/20 120/60    Past Medical History:  Diagnosis Date  . Anxiety    07/2019  . Arthritis   . Depression 08/23/2019  . History of chicken pox   . Hyperlipidemia    2015  . 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 Stroke [facility])    2006   Past Surgical History:  Procedure Laterality Date  . cervical staph infection     2006  . COLONOSCOPY     2015  . INGUINAL HERNIA REPAIR     kindergarten    reports that he has never smoked. He has never used smokeless tobacco. He reports current alcohol use of about 2.0 standard drinks of alcohol per week. He reports previous drug use. Drug: Marijuana. family history is not on file. No Known Allergies Current Outpatient Medications on File Prior to Visit  Medication Sig Dispense Refill  . ALPRAZolam (XANAX) 1 MG tablet 1 tab by mouth in the AM, and 1.5 tab in the PM as needed 75 tablet 2  . atorvastatin (LIPITOR) 20 MG tablet TAKE 1 TABLET BY MOUTH EVERY  DAy 90 tablet 3  . baclofen (LIORESAL) 10 MG tablet Take by mouth every 4 (four) hours.    . clotrimazole-betamethasone (LOTRISONE) cream APPLY TO AFFECTED AREA TWICE A DAY 30 g 2  . HYDROcodone-acetaminophen (NORCO) 10-325 MG per tablet Take 1 tablet by mouth every 8 (eight) hours as needed.     . naproxen (NAPROSYN) 500 MG tablet Take 1 tablet (500 mg total) by mouth 2 (two) times daily as needed for headache. 60 tablet 2  . omeprazole (PRILOSEC) 20 MG capsule Take 1 capsule (20 mg total) by mouth daily as needed. 30 capsule 11  . PARoxetine (PAXIL) 10 MG tablet Take 1 tablet (10 mg total) by mouth daily. 90 tablet 3  . SHINGRIX injection     . SUMAtriptan (IMITREX) 100 MG tablet Take 1 tablet (100 mg total) by mouth every 2 (two) hours as needed for migraine or headache. May repeat in 2 hours if headache persists or recurs. 10 tablet 11  . tamsulosin (FLOMAX) 0.4 MG CAPS capsule TAKE 1 CAPSULE BY MOUTH EVERY DAY 90 capsule 3   No current facility-administered medications on file prior to visit.        ROS:  All others reviewed and negative.  Objective  PE:  BP 110/76   Pulse 68   Temp 98.2 F (36.8 C) (Temporal)   Ht 5\' 7"  (1.702 m)   Wt 137 lb (62.1 kg)   SpO2 99%   BMI 21.46 kg/m                 Constitutional: Pt appears in NAD               HENT: Head: NCAT.                Right Ear: External ear normal.                 Left Ear: External ear normal.                Eyes: . Pupils are equal, round, and reactive to light. Conjunctivae and EOM are normal               Nose: without d/c or deformity               Neck: Neck supple. Gross normal ROM               Cardiovascular: Normal rate and regular rhythm.                 Pulmonary/Chest: Effort normal and breath sounds without rales or wheezing.                Left upper arm with point tenderness at the left deltoid humerus insertion site               Abd:  Soft, NT, ND, + BS, no organomegaly                Neurological: Pt is alert. At baseline orientation, motor grossly intact               Skin: Skin is warm. No rashes, no other new lesions, LE edema -none               Psychiatric: Pt behavior is normal without agitation   Assessment/Plan:  Danny Hopkins is a 60 y.o. Black or African American [2] male with  has a past medical history of Anxiety, Arthritis, Depression (08/23/2019), History of chicken pox, Hyperlipidemia, Neurologic gait disorder (07/24/2013), and Stroke [facility]).   Assessment Plan  See problem oriented assessment and plan Labs/data reviewed for each problem:  Micro: none  Cardiac tracings I have personally interpreted today:  none  Pertinent Radiological findings (summarize): none    There are no preventive care reminders to display for this patient.  There are no preventive care reminders to display for this patient.  Lab Results  Component Value Date   TSH 1.46 08/23/2019   Lab Results  Component Value Date   WBC 4.7 08/23/2019   HGB 16.7 08/23/2019   HCT 49.9 08/23/2019   MCV 92.8 08/23/2019   PLT 198.0 08/23/2019   Lab Results  Component Value Date   NA 136 08/23/2019   K 4.2 08/23/2019   CO2 29 08/23/2019   GLUCOSE 90 08/23/2019   BUN 23 08/23/2019   CREATININE 0.83 08/23/2019   BILITOT 1.2 08/23/2019   ALKPHOS 68 08/23/2019   AST 16 08/23/2019   ALT 18 08/23/2019   PROT 6.6 08/23/2019   ALBUMIN 4.5 08/23/2019   CALCIUM 10.0 08/23/2019   GFR 115.45 08/23/2019   Lab Results  Component Value Date   CHOL 133 08/23/2019   Lab Results  Component  Value Date   HDL 46.90 08/23/2019   Lab Results  Component Value Date   LDLCALC 75 08/23/2019   Lab Results  Component Value Date   TRIG 58.0 08/23/2019   Lab Results  Component Value Date   CHOLHDL 3 08/23/2019   No results found for: HGBA1C    Assessment & Plan:   Problem List Items Addressed This Visit      High   Left arm pain    C/w tendonitis at the deltoid humerus insertion  site; has hydrocodone per NS already, ok for topical volt gel prn, and salon Paz type lidocaine patch prn, and see sports med in this building if not improved        Medium   Generalized anxiety disorder    Stable, to continue current tx - xanax prn and paxil       Depression    Stable for now, declines specific change in tx or referral for counseling         No orders of the defined types were placed in this encounter.   Follow-up: Return if symptoms worsen or fail to improve.   Cathlean Cower, MD 04/19/2020 8:12 PM [facility]

## 2020-04-17 NOTE — Patient Instructions (Signed)
Ok to try the OTC Voltaren gel as needed to the part of the left arm that hurts  Also, you can try OTC Marlene Lard (which is a lidocaine patch) or similar patch for the pain as well  Please see Sports Medicine on the first floor if you are not getting better in 1 wk  Please continue all other medications as before, and refills have been done if requested.  Please have the pharmacy call with any other refills you may need.  Please continue your efforts at being more active, low cholesterol diet, and weight control.  Please keep your appointments with your specialists as you may have planned

## 2020-04-19 ENCOUNTER — Encounter: Payer: Self-pay | Admitting: Internal Medicine

## 2020-04-19 DIAGNOSIS — M79602 Pain in left arm: Secondary | ICD-10-CM | POA: Insufficient documentation

## 2020-04-19 NOTE — Assessment & Plan Note (Addendum)
Stable, to continue current tx - xanax prn and paxil

## 2020-04-19 NOTE — Assessment & Plan Note (Signed)
C/w tendonitis at the deltoid humerus insertion site; has hydrocodone per NS already, ok for topical volt gel prn, and salon Paz type lidocaine patch prn, and see sports med in this building if not improved

## 2020-04-19 NOTE — Assessment & Plan Note (Signed)
Stable for now, declines specific change in tx or referral for counseling

## 2020-04-21 NOTE — Progress Notes (Signed)
Subjective:    CC: L shoulder/upper arm pain  I, Molly Weber, LAT, ATC, am serving as scribe for Dr. Lynne Leader.  HPI: Pt is a 60 y/o male presenting w/ c/o L shoulder/upper arm pain since he had a stroke in 2006 and has worsened over the past month.  He locates his pain to his L ant shoulder and ant upper arm.  He has a hx of a stroke in 2006.  His stroke results in left hand weakness, and gait abnormality.  Radiating pain: yes in his L arm into his L forearm and hand Neck pain: No L shoulder mechanical symptoms: No L UE numbness/tingling: Yes into his L UE intermittently into his L hand Aggravating factors: L shoulder aBd: L arm hanging at his side Treatments tried: hydrocodone; Salon paz  Pertinent review of Systems: No fevers or chills  Relevant historical information: Stroke, muscle spasticity, left hand weakness.   Objective:    Vitals:   04/22/20 1049  BP: 94/60  Pulse: 60  SpO2: 100%   General: Well Developed, well nourished, and in no acute distress.   MSK: Left shoulder normal musculature. Nontender. Range of motion abduction limited to 140 degrees.  Otherwise external and internal rotational range of motion normal. Intact strength abduction external and internal rotation. Positive Hawkins and Neer's test. Negative Yergason's and speeds test. Positive empty can test.  Lab and Radiology Results  Procedure: Real-time Ultrasound Guided Injection of left subacromial bursa Device: Philips Affiniti 50G Images permanently stored and available for review in PACS Ultrasound examination prior to injection reveals intact rotator cuff tendons.  Moderate subacromial bursitis present. Verbal informed consent obtained.  Discussed risks and benefits of procedure. Warned about infection bleeding damage to structures skin hypopigmentation and fat atrophy among others. Patient expresses understanding and agreement Time-out conducted.   Noted no overlying erythema,  induration, or other signs of local infection.   Skin prepped in a sterile fashion.   Local anesthesia: Topical Ethyl chloride.   With sterile technique and under real time ultrasound guidance:  40 mg of Kenalog and 2 mL of Marcaine injected into subacromial bursa. Fluid seen entering the bursa.   Completed without difficulty   Pain immediately resolved suggesting accurate placement of the medication.   Advised to call if fevers/chills, erythema, induration, drainage, or persistent bleeding.   Images permanently stored and available for review in the ultrasound unit.  Impression: Technically successful ultrasound guided injection.   X-ray images left shoulder obtained today personally independently interpreted. Anterior cervical fusion plate present.  AC DJD present mildly.  Mild glenohumeral DJD. Await formal radiology review     Impression and Recommendations:    Assessment and Plan: 60 y.o. male with left shoulder pain thought to be due to subacromial bursitis.  Patient had dramatic improvement following the injection.  It is not clear why he developed bursitis.  He does not use his left arm very much because of his left arm and hand weakness as result of the stroke.  Plan for injection today.  Also refer to physical therapy.  Recheck back in 8 weeks.  Additionally will proceed with x-ray..  Recommend Voltaren gel as well.  PDMP not reviewed this encounter. Orders Placed This Encounter  Procedures  . [place] LIMITED JOINT SPACE STRUCTURES UP LEFT(NO LINKED CHARGES)    Order Specific Question:   Reason for Exam (SYMPTOM  OR DIAGNOSIS REQUIRED)    Answer:   L shoulder pain    Order Specific Question:  Preferred imaging location?    Answer:   [place]  . DG Shoulder Left    Standing Status:   Future    Number of Occurrences:   1    Standing Expiration Date:   05/23/2020    Order Specific Question:   Reason for Exam (SYMPTOM  OR DIAGNOSIS REQUIRED)    Answer:    L shoulder pain    Order Specific Question:   Preferred imaging location?    Answer:   [facility]  . Ambulatory referral to Physical Therapy    Referral Priority:   Routine    Referral Type:   Physical Medicine    Referral Reason:   Specialty Services Required    Requested Specialty:   Physical Therapy   No orders of the defined types were placed in this encounter.   Discussed warning signs or symptoms. Please see discharge instructions. Patient expresses understanding.   The above documentation has been reviewed and is accurate and complete Lynne Leader, M.D.

## 2020-04-22 ENCOUNTER — Ambulatory Visit: Payer: Medicare HMO

## 2020-04-22 ENCOUNTER — Other Ambulatory Visit: Payer: Self-pay

## 2020-04-22 ENCOUNTER — Encounter: Payer: Self-pay | Admitting: Family Medicine

## 2020-04-22 ENCOUNTER — Ambulatory Visit: Payer: Medicare HMO | Admitting: Family Medicine

## 2020-04-22 ENCOUNTER — Ambulatory Visit (INDEPENDENT_AMBULATORY_CARE_PROVIDER_SITE_OTHER): Payer: Medicare HMO

## 2020-04-22 ENCOUNTER — Ambulatory Visit: Payer: Self-pay

## 2020-04-22 VITALS — BP 94/60 | HR 60 | Ht 67.0 in | Wt 137.6 lb

## 2020-04-22 DIAGNOSIS — R269 Unspecified abnormalities of gait and mobility: Secondary | ICD-10-CM | POA: Diagnosis not present

## 2020-04-22 DIAGNOSIS — G8929 Other chronic pain: Secondary | ICD-10-CM

## 2020-04-22 DIAGNOSIS — Z8673 Personal history of transient ischemic attack (TIA), and cerebral infarction without residual deficits: Secondary | ICD-10-CM

## 2020-04-22 DIAGNOSIS — M25512 Pain in left shoulder: Secondary | ICD-10-CM | POA: Diagnosis not present

## 2020-04-22 DIAGNOSIS — M19012 Primary osteoarthritis, left shoulder: Secondary | ICD-10-CM | POA: Diagnosis not present

## 2020-04-22 NOTE — Patient Instructions (Addendum)
You had a L shoulder injection today.  Call or go to the ER if you develop a large red swollen joint with extreme pain or oozing puss.   Please get an Xray today before you leave  I've referred you to Physical Therapy.  Let us know if you don't hear from them in one week.  Please use voltaren gel up to 4x daily for pain as needed.   Recheck with me in 8 weeks.   Let me know sooner if needed.

## 2020-04-22 NOTE — Progress Notes (Signed)
Mild arthritis present in the shoulder.  No fractures visible.

## 2020-04-23 ENCOUNTER — Other Ambulatory Visit: Payer: Self-pay

## 2020-04-23 ENCOUNTER — Ambulatory Visit: Payer: Medicare HMO | Attending: Family Medicine

## 2020-04-23 DIAGNOSIS — G8929 Other chronic pain: Secondary | ICD-10-CM | POA: Diagnosis not present

## 2020-04-23 DIAGNOSIS — R293 Abnormal posture: Secondary | ICD-10-CM | POA: Diagnosis not present

## 2020-04-23 DIAGNOSIS — M6281 Muscle weakness (generalized): Secondary | ICD-10-CM | POA: Diagnosis not present

## 2020-04-23 DIAGNOSIS — M25612 Stiffness of left shoulder, not elsewhere classified: Secondary | ICD-10-CM | POA: Diagnosis not present

## 2020-04-23 DIAGNOSIS — R2689 Other abnormalities of gait and mobility: Secondary | ICD-10-CM | POA: Insufficient documentation

## 2020-04-23 DIAGNOSIS — M25512 Pain in left shoulder: Secondary | ICD-10-CM | POA: Insufficient documentation

## 2020-04-23 DIAGNOSIS — R296 Repeated falls: Secondary | ICD-10-CM | POA: Insufficient documentation

## 2020-04-23 DIAGNOSIS — M545 Low back pain, unspecified: Secondary | ICD-10-CM | POA: Diagnosis not present

## 2020-04-23 NOTE — Therapy (Signed)
[place] [place], [place], [address] Phone: [phone]   Fax:  [phone]  Physical Therapy Evaluation  Patient Details  Name: Danny Hopkins MRN: [id number] Date of Birth: March 04, 1961 Referring Provider (PT): Marikay Alar Date: 04/23/2020   PT End of Session - 04/23/20 [id number]    Visit Number 1    Number of Visits 13    Date for PT Re-Evaluation 06/04/20    PT Start Time 1100    PT Stop Time 1150    PT Time Calculation (min) 50 min           Past Medical History:  Diagnosis Date  . Anxiety    07/2019  . Arthritis   . Depression 08/23/2019  . History of chicken pox   . Hyperlipidemia    2015  . 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 Stroke [facility])    2006    Past Surgical History:  Procedure Laterality Date  . cervical staph infection     2006  . COLONOSCOPY     2015  . INGUINAL HERNIA REPAIR     kindergarten    There were no vitals filed for this visit.    Subjective Assessment - 04/23/20 1059    Subjective Pt had a stroke in 2006 primarily effecting left side. At this time he reports also having an infection in one of his vertebrae causing some paralysis. He regained some function after this but about 3 years ao began to experience L shoulder pain/achiness, especially when sleeping on arm or when he is standing and it is hanging to the side. He has spasticity all along his left side. 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    Pertinent History Stroke 2006 with LUE/LLE involvement. Middle low back pain which began after stroke.    Limitations Lifting;Standing;Walking;House hold activities    How long can you sit comfortably? unlimited    How long can you stand comfortably? 5 minutes    How long can you  walk comfortably? 47minutes    Diagnostic tests Reports yesterday had [place] and cortisone shot. Reports feeling  L shoulder pain improvement post injection.    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 Yes   None today. Yesterday achy pain 5/10 but improved with patch.   Pain Score 0-No pain    Pain Location Shoulder    Pain Orientation Left    Pain Descriptors / Indicators Aching    Pain Type Chronic pain    Pain Radiating Towards lower upper arm, forearm    Pain Onset More than a month ago    Pain Frequency Constant    Aggravating Factors  standing with it hanging to the side, trying to reach overhead or out to the side.   pain increase to about 5-6/10 at worst   Pain Relieving Factors salonpas patch    Effect of Pain on Daily Activities cannot reach overhead, difficulty with dressing,    Multiple Pain Sites Yes    Pain Score 5    Pain Location Back    Pain Orientation Lower    Pain Descriptors / Indicators Heaviness  Pain Type Chronic pain    Pain Onset More than a month ago    Pain Frequency Constant    Aggravating Factors  when standing for while, when walking for a while    Pain Relieving Factors Laying down, pain med (hydrocodone daily)    Effect of Pain on Daily Activities Increased time to complete housework              [facility] PT Assessment - 04/23/20 0001      Assessment   Medical Diagnosis Chronic L shoulder pain    Referring Provider (PT) Georgina Snell    Onset Date/Surgical Date --   2006 - stroke   Hand Dominance Right    Next MD Visit May 2022      Precautions   Precautions Fall      Balance Screen   Has the patient fallen in the past 6 months Yes    How many times? 4-5 times, inside of the home    Has the patient had a decrease in activity level because of a fear of falling?  Yes    Is the patient reluctant to leave their home because of a fear of falling?  Yes       [place] residence    Living Arrangements Alone    Available Help at Discharge Friend(s)    Type of Home Apartment    Home Access Level entry    Additional Comments Much difficulty to do stairs, worried about legs buckling  especially with descent      Prior Function   Level of Independence --   Modified independent with ADLs, gait, transfers post stroke. Pt report decreasing function and balance since december.   Vocation Full time Optician, dispensing detailing   recently more difficult to get around. Hasnt been able to do so since warmer days december 2021.   Leisure detailing cars, watching tv, walking the dog      Cognition   Overall Cognitive Status Within Functional Limits for tasks assessed      Observation/Other Assessments   Focus on Therapeutic Outcomes (FOTO)  30/100: Stage 2 - poor shoulder function      Posture/Postural Control   Posture/Postural Control Postural limitations    Postural Limitations Rounded Shoulders;Forward head;Decreased lumbar lordosis;Posterior pelvic tilt;Flexed trunk;Weight shift right      Tone   Assessment Location Left Upper Extremity;Left Lower Extremity      ROM / Strength   AROM / PROM / Strength AROM;Strength      AROM   Overall AROM Comments Shoulder flexion: R=160, L=125. Abduction: R=160, L=140. ER: L=60, R=45+ pain at posterior shoulder.   Spastic flexion pattern kicks in at 90 deg flex/abd with minimal control to keep hand from hitting head   Right Shoulder Internal Rotation --   T10   Right Shoulder External Rotation --   T5   Left Shoulder Internal Rotation --   L1   Left Shoulder External Rotation --   left spine of scapula   Right/Left Elbow --   B Elbow AROM WFL   Right/Left Wrist --   Gross LEFT hand,wrist, finger limited ROM     Strength   Strength Assessment Site Shoulder;Elbow;Forearm;Hip;Knee;Lumbar    Right/Left Shoulder Left    Right Shoulder Flexion 5/5     Right Shoulder Extension 5/5    Right Shoulder ABduction 5/5    Right Shoulder External Rotation 5/5    Left  Shoulder Flexion 4/5   within available range   Left Shoulder Extension 4/5    Left Shoulder ABduction 3+/5    Left Shoulder External Rotation 3/5    Right/Left Elbow Right;Left    Right Elbow Flexion 5/5    Right Elbow Extension 5/5    Left Elbow Flexion 5/5    Left Elbow Extension 3/5    Right/Left Hip Right;Left    Right Hip Flexion 4+/5    Left Hip Flexion 2+/5    Right/Left Knee Left;Right    Right Knee Extension 4+/5    Left Knee Extension 3+/5    Lumbar Flexion 3/5    Lumbar Extension 3/5      Flexibility   Soft Tissue Assessment /Muscle Length yes    Hamstrings Miderate tightness L>R      Transfers   Five time sit to stand comments  23"   > 15 sec = risk of falls     Ambulation/Gait   Stairs Yes    Stairs Assistance 6: Modified independent (Device/Increase time);5: Supervision    Stairs Assistance Details (indicate cue type and reason) CS-CGA    Stair Management Technique Two rails;Step to pattern;Forwards;Backwards   Right lead up, left lead down   Height of Stairs 4    Gait Comments Slighlty crouched gait, general instability with frequent lateral deviations with attempt to ambulate straight path. slight circumduction L, diminished ws left. increasing crouch with prolonged standing , dcrd endurance      Balance   Balance Assessed Yes      Standardized Balance Assessment   Standardized Balance Assessment Timed Up and Go Test      Timed Up and Go Test   TUG Normal TUG    Normal TUG (seconds) 19   Normal: >13.5 sec indicates high fall risk     LUE Tone   LUE Tone Moderate   Minimal active isolated UE movements out of spastic pattern     LLE Tone   LLE Tone Moderate                      Objective measurements completed on examination: See above findings.               PT Education - 04/23/20 1241    Education Details POC  and HEP. Fall prevention methods reviewed with recommendation to utilize cane especialy when out in the community.    Person(s) Educated Patient    Methods Explanation;Demonstration;Verbal cues;Handout;Tactile cues    Comprehension Verbalized understanding;Returned demonstration            PT Short Term Goals - 04/23/20 1253      PT SHORT TERM GOAL #1   Title Pt will be independent with initial HEP    Time 2    Period Weeks    Status New    Target Date 05/07/20             PT Long Term Goals - 04/23/20 1254      PT LONG TERM GOAL #1   Title Pt will complete 5xSTS in </= 12 seconds to demo decreased falls risk.    Time 6    Period Weeks    Status New    Target Date 06/18/20      PT LONG TERM GOAL #2   Title Pt will be able to negotiate stairs safely without LOB.    Time 6    Period Weeks    Status New  Target Date 06/04/20      PT LONG TERM GOAL #3   Title FOTO to at least 58% to indicate significant funcitonal improvement in L shoulder function.    Time 6    Period Weeks    Status New    Target Date 06/04/20      PT LONG TERM GOAL #4   Title Pt will be able to reach overhead to at least 150 deg on the left    Time 6    Period Weeks    Status New    Target Date 06/04/20                  Plan - 04/23/20 1244    Clinical Impression Statement Pt had a stroke in 2006 with left side involvement. . About 3 years ao began to experience L shoulder pain/achiness, especially when sleeping on arm or when he is standing and it is hanging to the side. He has spasticity all along his left side. 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 Pt presents with limited shoulder ROM, general ms weakness, decreased ms endurance, limited active isolated UE movement against spastic pattern, decreased balance,  abnormal gait and posture. As a result, pt has diffiuclty with dressing, reahcing overhead/lifting, ADLs, and has difficulty negotiting the home and community environments safely. He scores 23" with 5TSTS and 19" with Timed up and go both indicating high falls risk at this time. Amelio would benefit from skilled physical therapy to improve overall functional strength and mobility, and decrease risk of falls.    Personal Factors and Comorbidities Comorbidity 3+;Fitness;Time since onset of injury/illness/exacerbation    Examination-Activity Limitations Bend;Locomotion Level;Reach Overhead;Transfers;Carry;Dressing;Hygiene/Grooming;Lift;Toileting;Stand;Stairs;Squat    Examination-Participation Restrictions Cleaning;Occupation;Community Activity;Laundry;Shop    Stability/Clinical Decision Making Evolving/Moderate complexity    Clinical Decision Making Moderate    Rehab Potential Good    PT Frequency 2x / week    PT Duration 6 weeks    PT Treatment/Interventions Iontophoresis 4mg /ml Dexamethasone;Moist Heat;Cryotherapy;Traction;Electrical Stimulation;Gait training;Stair training;Functional mobility training;Therapeutic activities;Therapeutic exercise;Balance training;Neuromuscular re-education;Manual techniques;Patient/family education;Taping;Dry needling;ADLs/Self Care Home Management    PT Next Visit Plan Follow up on HEP carryover. Treatment to focus on strengthening UE extensors, RTC, improving ROM. LE strengthening  and balance as tolerated. Modalities and manual as needed.    PT Home Exercise Plan Seated push ups, Sit <> stands,seated march, supine shoulder press    Recommended Other Services Pt may benefit from Occupational therapy evaluation for hand function/ADLs    Consulted and Agree with Plan of Care Patient           Patient will benefit from skilled therapeutic intervention in order to improve the following deficits and impairments:  Abnormal gait,Decreased coordination,Decreased range of  motion,Difficulty walking,Increased fascial restricitons,Impaired tone,Impaired UE functional use,Increased muscle spasms,Decreased endurance,Decreased activity tolerance,Pain,Improper body mechanics,Impaired flexibility,Decreased balance,Decreased mobility,Decreased strength,Postural dysfunction  Visit Diagnosis: Chronic left shoulder pain - Plan: PT plan of care cert/re-cert  Repeated falls - Plan: PT plan of care cert/re-cert  Muscle weakness (generalized) - Plan: PT plan of care cert/re-cert  Stiffness of left shoulder, not elsewhere classified - Plan: PT plan of care cert/re-cert  Abnormal posture - Plan: PT plan of care cert/re-cert  Chronic low back pain without sciatica, unspecified back pain laterality - Plan: PT plan of care cert/re-cert  Other abnormalities of gait and mobility -  Plan: PT plan of care cert/re-cert     Problem List Patient Active Problem List   Diagnosis Date Noted  . Left arm pain 04/19/2020  . Migraines 01/07/2020  . Depression 08/23/2019  . Vitamin D deficiency 08/25/2018  . Laceration of left hand 09/27/2017  . Acute pharyngitis 07/14/2017  . Chronic tonsillitis 07/12/2017  . Chronic pain syndrome 06/22/2017  . Bradycardia 06/22/2017  . Abnormal ECG 06/22/2017  . Smoker 06/21/2016  . Scalp pain 07/30/2015  . Muscle spasticity 06/24/2015  . Dandruff 06/24/2015  . Lymphadenitis 06/24/2015  . Burning with urination 01/25/2015  . Balanitis 01/25/2015  . Sleeping difficulties 02/04/2014  . Bladder neck obstruction 02/04/2014  . Hyperlipidemia 02/04/2014  . Other malaise and fatigue 07/24/2013  . Stroke ([place]) 07/24/2013  . Routine general medical examination at a health care facility 07/24/2013  . 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 Insomnia 10/12/2012  . Generalized anxiety disorder 10/12/2012  . Eczema 10/12/2012  . History of stroke 10/12/2012    Hall Busing, PT, DPT 04/23/2020, 1:04 PM  [place]. [place], [place], [address] Phone: [phone]   Fax:  [phone]  Name: Danny Hopkins MRN: [id number] Date of Birth: Oct 21, 1960

## 2020-04-23 NOTE — Patient Instructions (Signed)
Access Code: [email or web address] URL: [email or web address] Date: 04/23/2020 Prepared by: Sherlynn Stalls  Exercises Seated March - 1 x daily - 7 x weekly - 3 sets - 10 reps Sit to Stand with Counter Support - 1 x daily - 7 x weekly - 3 sets - 10 reps Supine Shoulder Press AAROM in Abduction with Dowel (cane) - 1 x daily - 7 x weekly - 3 sets - 10 reps Seated Chair Push Ups - 1 x daily - 7 x weekly - 3 sets - 10 reps

## 2020-04-24 ENCOUNTER — Ambulatory Visit: Payer: Medicare HMO

## 2020-04-27 ENCOUNTER — Ambulatory Visit: Payer: Medicare HMO | Admitting: Physical Therapy

## 2020-04-28 NOTE — Progress Notes (Signed)
° °  I, Peterson Lombard, LAT, ATC acting as a scribe for Lynne Leader, MD.  Danny Hopkins is a 60 y.o. male who presents to [place] [facility] today for low back pain and gait abnormalities. Pt suffered a stroke in 2006. Pt was last seen by Dr. Georgina Snell on 04/22/20 for chronic L shoulder pain and neurologic gait disorder. At last visit, pt was advised to use Voltaren gel and was referred to PT of which he's completed 1 visit. Today, pt locates pain to middle of low back which is effecting his gait.   LE numbness/tingling: no LE weakness: yes Aggravates: standing, whenever he gets tired Rx tried: Hydrocodone   Dx imaging: 04/22/20 L shoulder XR  03/31/10 L-spine MRI  Pertinent review of systems: No fevers or chills  Relevant historical information: Free of stroke resulted in lower extremity weakness   Exam:  BP 98/78 (BP Location: Right Arm, Patient Position: Sitting, Cuff Size: Normal)    Pulse 60    Ht 5\' 7"  (1.702 m)    Wt 132 lb 9.6 oz (60.1 kg)    SpO2 99%    BMI 20.77 kg/m  General: Well Developed, well nourished, and in no acute distress.   MSK: L-spine normal-appearing Nontender L-spine midline. Tender palpation lumbar paraspinal musculature. Lumbar motion decreased extension normal flexion and lateral flexion and rotation. Lower extremity strength Hip flexion 5/5 bilaterally. Hip abduction 4/5 bilaterally. Hip adduction 5/5 bilaterally. Knee extension 5/5 bilaterally. Knee flexion 5/5 bilaterally. Foot dorsiflexion 4/5 left.  5/5 right. Foot plantar flexion 5/5 bilaterally.  Significant abnormal gait.    Lab and Radiology Results X-ray L-spine images obtained today personally and independently interpreted. DDD L5-S1.  Minimal scoliotic curve concave left Await formal radiology review   Assessment and Plan: 60 y.o. male with low back pain.  This is been worsening for a few months now.  Patient has an abnormal gait as result of his stroke which  may be contributory to his back pain.  However I believe the majority of his pain is muscle dysfunction and spasm.  He is a good candidate for physical therapy.  Plan for referral to physical therapy and recheck back in a month.  If not improving would proceed to MRI of lumbar spine.  Recheck back sooner if needed.  PDMP not reviewed this encounter. Orders Placed This Encounter  Procedures   DG Lumbar Spine 2-3 Views    Standing Status:   Future    Number of Occurrences:   1    Standing Expiration Date:   04/29/2021    Order Specific Question:   Reason for Exam (SYMPTOM  OR DIAGNOSIS REQUIRED)    Answer:   low back pain    Order Specific Question:   Preferred imaging location?    Answer:   [facility]   Ambulatory referral to Physical Therapy    Referral Priority:   Routine    Referral Type:   Physical Medicine    Referral Reason:   Specialty Services Required    Requested Specialty:   Physical Therapy   No orders of the defined types were placed in this encounter.    Discussed warning signs or symptoms. Please see discharge instructions. Patient expresses understanding.   The above documentation has been reviewed and is accurate and complete Lynne Leader, M.D.

## 2020-04-29 ENCOUNTER — Ambulatory Visit (INDEPENDENT_AMBULATORY_CARE_PROVIDER_SITE_OTHER): Payer: Medicare HMO

## 2020-04-29 ENCOUNTER — Ambulatory Visit: Payer: Medicare HMO | Admitting: Physical Therapy

## 2020-04-29 ENCOUNTER — Ambulatory Visit: Payer: Medicare HMO | Admitting: Family Medicine

## 2020-04-29 ENCOUNTER — Other Ambulatory Visit: Payer: Self-pay

## 2020-04-29 VITALS — BP 98/78 | HR 60 | Ht 67.0 in | Wt 132.6 lb

## 2020-04-29 DIAGNOSIS — M545 Low back pain, unspecified: Secondary | ICD-10-CM

## 2020-04-29 NOTE — Patient Instructions (Signed)
Thank you for coming in today.  I've referred you to Physical Therapy.  Let us know if you don't hear from them in one week.  Please get an Xray today before you leave  Recheck with me in 1 month.   Keep me updated.

## 2020-04-30 DIAGNOSIS — L600 Ingrowing nail: Secondary | ICD-10-CM | POA: Diagnosis not present

## 2020-04-30 DIAGNOSIS — B079 Viral wart, unspecified: Secondary | ICD-10-CM | POA: Diagnosis not present

## 2020-04-30 NOTE — Progress Notes (Signed)
Spoke w/ pt and conveyed Dr. Corey's result note. Pt verbalized understanding.  

## 2020-04-30 NOTE — Progress Notes (Signed)
Xray lumbar spine shows mild arthritis.

## 2020-05-04 ENCOUNTER — Ambulatory Visit: Payer: Medicare HMO | Admitting: Physical Therapy

## 2020-05-06 ENCOUNTER — Other Ambulatory Visit: Payer: Self-pay

## 2020-05-06 ENCOUNTER — Ambulatory Visit: Payer: Medicare HMO | Attending: Family Medicine

## 2020-05-06 DIAGNOSIS — M25612 Stiffness of left shoulder, not elsewhere classified: Secondary | ICD-10-CM | POA: Diagnosis not present

## 2020-05-06 DIAGNOSIS — G8929 Other chronic pain: Secondary | ICD-10-CM | POA: Diagnosis not present

## 2020-05-06 DIAGNOSIS — R296 Repeated falls: Secondary | ICD-10-CM | POA: Insufficient documentation

## 2020-05-06 DIAGNOSIS — R293 Abnormal posture: Secondary | ICD-10-CM | POA: Diagnosis not present

## 2020-05-06 DIAGNOSIS — M545 Low back pain, unspecified: Secondary | ICD-10-CM | POA: Insufficient documentation

## 2020-05-06 DIAGNOSIS — M6281 Muscle weakness (generalized): Secondary | ICD-10-CM | POA: Insufficient documentation

## 2020-05-06 DIAGNOSIS — R2689 Other abnormalities of gait and mobility: Secondary | ICD-10-CM | POA: Insufficient documentation

## 2020-05-06 NOTE — Patient Instructions (Signed)
Access Code: [email or web address] URL: [email or web address] Date: 05/06/2020 Prepared by: Sherlynn Stalls  Exercises Seated March - 1 x daily - 7 x weekly - 3 sets - 10 reps Sit to Stand with Counter Support - 1 x daily - 7 x weekly - 3 sets - 10 reps Seated Chair Push Ups - 1 x daily - 7 x weekly - 3 sets - 10 reps Supine Bridge - 1 x daily - 7 x weekly - 3 sets - 10 reps Supine Lower Trunk Rotation - 1 x daily - 7 x weekly - 3 sets - 10 reps

## 2020-05-06 NOTE — Therapy (Signed)
[place]. [place], [place], [address] Phone: [phone]   Fax:  [phone]  Physical Therapy Treatment  Patient Details  Name: Danny Hopkins MRN: [id number] Date of Birth: [birth year] Referring Provider (PT): Marikay Alar Date: 05/06/2020   PT End of Session - 05/06/20 1224    Visit Number 2    Number of Visits 13    Date for PT Re-Evaluation 06/04/20    PT Start Time 1324    PT Stop Time 1100    PT Time Calculation (min) 45 min    Equipment Utilized During Treatment Gait belt    Activity Tolerance Patient tolerated treatment well    Behavior During Therapy [facility] for tasks assessed/performed           Past Medical History:  Diagnosis Date  . Anxiety    07/2019  . Arthritis   . Depression 08/23/2019  . History of chicken pox   . Hyperlipidemia    2015  . 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 Stroke [facility])    2006    Past Surgical History:  Procedure Laterality Date  . cervical staph infection     2006  . COLONOSCOPY     2015  . INGUINAL HERNIA REPAIR     kindergarten    There were no vitals filed for this visit.   Subjective Assessment - 05/06/20 1218    Subjective Pt saw Dr Georgina Snell MD Orthopedics on 2/2 at which time it was decided to discontinue PT with L shoulder pain focus and instead focus on PT for LBP and gait/strength/balance, which was included in initial PT eval. Pt reports has been doing original HEP and shoudler exercises and shoulder is feeling great, no big pain.  LBP continues to bother and feel real stiff. Notices as he gets tired he starts to bend forward and knees come together to stay standing. Says with walking dog for a bit starts to get top of foot pain when he steps (stepping with forefoot first) and then gets better with putting weight on heel.    Pertinent History Stroke 2006 with LUE/LLE involvement. Middle low back pain which began after  stroke.    Limitations Lifting;Standing;Walking;House hold activities    How long can you sit comfortably? unlimited    How long can you stand comfortably? 5 minutes    How long can you walk comfortably? 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 Yes    Pain Score 5     Pain Location Back    Pain Orientation Lower    Pain Descriptors / Indicators Tightness    Pain Type Chronic pain                             [facility] Adult PT Treatment/Exercise - 05/06/20 1027      Ambulation/Gait   Ambulation/Gait Yes    Ambulation/Gait Assistance 6: Modified independent (Device/Increase time)   Increased time. Gait belt. VCs for upright posture. 75 ft x 2 with VCs for widening BOS to decrease scissoring. Did well with constant cueing. As verbal cues were faded scissoring reemerged requiring intermittent cue. Increased scissoring with turns.   Stairs Yes    Stairs Assistance 6: Modified independent (Device/Increase time);5: Supervision  Stairs Assistance Details (indicate cue type and reason) CS-CGA    Stair Management Technique Two rails;Alternating pattern   UE dependence   Height of Stairs --   4 and 6   Pre-Gait Activities Stepping with heel strike and weight aceptance, clearing foot over orange cones. External cue for trunk extension "dont let your head hit my hand". x 10 alternating BUE support with UE dependence, x 5 alt with LUE support.      Exercises   Exercises Lumbar;Knee/Hip;Ankle      Lumbar Exercises: Stretches   Lower Trunk Rotation Limitations x 10 B    Other Lumbar Stretch Exercise Seated physioball forward flexion, R/L stretch 10 each direction      Lumbar Exercises: Aerobic   Recumbent Bike L1 x6 minutes      Lumbar Exercises: Seated   Sit to Stand Limitations 10 x 2 from high perch/elevated mat table.      Lumbar Exercises: Supine   Bridge  Limitations 10 x 2 cues for neutral positioning/ prevent ADD      Knee/Hip Exercises: Standing   Hip Abduction Stengthening;AROM;10 reps   B alternating, BUE support  bars   Hip Extension AROM;Stengthening;10 reps   B alternating, BUE support  bars   Gait Training 75 ft  cues for heel strike, step width, trunk posture.    Other Standing Knee Exercises Heel taps to 4" step x10 LLE only, Heel taps to 6" step alternating x 15 B   1 HR support, CS   Other Standing Knee Exercises Marches alternating BUE support in  bars                  PT Education - 05/06/20 1222    Education Details Updated HEP provided with focus on Lumbar mobility, hip extensor strength. Access code [facility]    Person(s) Educated Patient    Methods Explanation;Demonstration;Handout;Verbal cues    Comprehension Verbalized understanding;Returned demonstration            PT Short Term Goals - 04/23/20 1253      PT SHORT TERM GOAL #1   Title Pt will be independent with initial HEP    Time 2    Period Weeks    Status New    Target Date 05/07/20             PT Long Term Goals - 05/06/20 1237      PT LONG TERM GOAL #1   Title Pt will complete 5xSTS in </= 12 seconds to demo decreased falls risk.    Period Weeks    Status New    Target Date 06/04/20      PT LONG TERM GOAL #2   Title Pt will be able to negotiate stairs safely without LOB.    Time 6    Period Weeks    Status New    Target Date 06/04/20      PT LONG TERM GOAL #3   Title FOTO to at least 58% to indicate significant funcitonal improvement in L shoulder function.    Time 6    Period Weeks    Status New    Target Date 06/04/20      PT LONG TERM GOAL #4   Title Pt will be able to reach overhead to at least 150 deg on the left    Time 6    Period Weeks    Status New    Target Date 06/04/20      PT LONG TERM  GOAL #5   Title pain <3/10 upon getting out of bed in the morning    Time 6    Period Weeks    Status New     Target Date 06/04/20      Additional Long Term Goals   Additional Long Term Goals Yes      PT LONG TERM GOAL #6   Title Get BBS score and increase by at least 5 points    Time 6    Period Weeks    Status New    Target Date 06/04/20                 Plan - 05/06/20 1224    Clinical Impression Statement Pt tolerated first treatment session very nicely. Focus of session on LE strengthening with dynamic balance, gait trianing. With all standing activities he needed alot of cueing to keep trunk upright as he began to fatigue, decreased ms endurance. he did well with cues with external focus ( Ex  bring knee to touch railing with standing marches, dont let your head touch my hand with pre gait activities to promote upright trunk, dont let toes hit cones with pregait stepping fr foot clearance). He is very UE dependent with standing exercises and stairs but was willing to attempt standing exercises with 1 hand supported. observed increased scissoring, adductor tone with gait today causing LOB/gait instability as a result of NBOS. He did well with verbal cues for taking wide steps (hip width apart) but required frequent cues.  LLE much weaker than left limiting foot clearance. he will benefit from continued gait trianing, strengthening, endurance training and balance to decrease falls rsk and improve safety    Personal Factors and Comorbidities Comorbidity 3+;Fitness;Time since onset of injury/illness/exacerbation    Examination-Activity Limitations Bend;Locomotion Level;Reach Overhead;Transfers;Carry;Dressing;Hygiene/Grooming;Lift;Toileting;Stand;Stairs;Squat    Examination-Participation Restrictions Cleaning;Occupation;Community Activity;Laundry;Shop    Rehab Potential Good    PT Frequency 2x / week    PT Duration 6 weeks    PT Treatment/Interventions Iontophoresis 4mg /ml Dexamethasone;Moist Heat;Cryotherapy;Traction;Electrical Stimulation;Gait training;Stair training;Functional mobility  training;Therapeutic activities;Therapeutic exercise;Balance training;Neuromuscular re-education;Manual techniques;Patient/family education;Taping;Dry needling;ADLs/Self Care Home Management    PT Next Visit Plan Check components of Berg as tolerated next visit. Tx focus to include LE strengthening (emphasis on proximal strength - core, hip ext/abd)  and balance as tolerated. High intensity gait training for gait mechanics, endurance and stability as tolerated. Lumbar spine ROM and stabilization.  Modalities and manual as needed.    PT Home Exercise Plan [id number]    Consulted and Agree with Plan of Care Patient           Patient will benefit from skilled therapeutic intervention in order to improve the following deficits and impairments:  Abnormal gait,Decreased coordination,Decreased range of motion,Difficulty walking,Increased fascial restricitons,Impaired tone,Impaired UE functional use,Increased muscle spasms,Decreased endurance,Decreased activity tolerance,Pain,Improper body mechanics,Impaired flexibility,Decreased balance,Decreased mobility,Decreased strength,Postural dysfunction  Visit Diagnosis: Repeated falls  Muscle weakness (generalized)  Abnormal posture  Chronic low back pain without sciatica, unspecified back pain laterality  Other abnormalities of gait and mobility  Stiffness of left shoulder, not elsewhere classified     Problem List Patient Active Problem List   Diagnosis Date Noted  . Left arm pain 04/19/2020  . Migraines 01/07/2020  . Depression 08/23/2019  . Vitamin D deficiency 08/25/2018  . Laceration of left hand 09/27/2017  . Acute pharyngitis 07/14/2017  . Chronic tonsillitis 07/12/2017  . Chronic pain syndrome 06/22/2017  . Bradycardia 06/22/2017  . Abnormal ECG 06/22/2017  . Smoker 06/21/2016  .  Scalp pain 07/30/2015  . Muscle spasticity 06/24/2015  . Dandruff 06/24/2015  . Lymphadenitis 06/24/2015  . Burning with urination 01/25/2015  .  Balanitis 01/25/2015  . Sleeping difficulties 02/04/2014  . Bladder neck obstruction 02/04/2014  . Hyperlipidemia 02/04/2014  . Other malaise and fatigue 07/24/2013  . Stroke ([place]) 07/24/2013  . Routine general medical examination at a health care facility 07/24/2013  . 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 Insomnia 10/12/2012  . Generalized anxiety disorder 10/12/2012  . Eczema 10/12/2012  . History of stroke 10/12/2012    Hall Busing, PT, DPT 05/06/2020, 12:44 PM  [place]. [place], [place], [address] Phone: [phone]   Fax:  [phone]  Name: Danny Hopkins MRN: [id number] Date of Birth: September 21, 1960

## 2020-05-07 ENCOUNTER — Telehealth: Payer: Self-pay | Admitting: Internal Medicine

## 2020-05-07 NOTE — Telephone Encounter (Signed)
LVM for pt to rtn my call to schedule AWV with [facility]. Please schedule this appt if pt calls the office.  °

## 2020-05-11 ENCOUNTER — Encounter: Payer: Self-pay | Admitting: Physical Therapy

## 2020-05-11 ENCOUNTER — Other Ambulatory Visit: Payer: Self-pay

## 2020-05-11 ENCOUNTER — Ambulatory Visit: Payer: Medicare HMO | Admitting: Physical Therapy

## 2020-05-11 DIAGNOSIS — M545 Low back pain, unspecified: Secondary | ICD-10-CM | POA: Diagnosis not present

## 2020-05-11 DIAGNOSIS — R296 Repeated falls: Secondary | ICD-10-CM

## 2020-05-11 DIAGNOSIS — R2689 Other abnormalities of gait and mobility: Secondary | ICD-10-CM | POA: Diagnosis not present

## 2020-05-11 DIAGNOSIS — G8929 Other chronic pain: Secondary | ICD-10-CM

## 2020-05-11 DIAGNOSIS — R293 Abnormal posture: Secondary | ICD-10-CM

## 2020-05-11 DIAGNOSIS — M6281 Muscle weakness (generalized): Secondary | ICD-10-CM | POA: Diagnosis not present

## 2020-05-11 DIAGNOSIS — M25612 Stiffness of left shoulder, not elsewhere classified: Secondary | ICD-10-CM | POA: Diagnosis not present

## 2020-05-11 NOTE — Therapy (Signed)
[place]. [place], [place], [address] Phone: [phone]   Fax:  [phone]  Physical Therapy Treatment  Patient Details  Name: Danny Hopkins MRN: [id number] Date of Birth: February 04, 1961 Referring Provider (PT): Marikay Alar Date: 05/11/2020   PT End of Session - 05/11/20 1058    Visit Number 3    Number of Visits 13    Date for PT Re-Evaluation 06/04/20    PT Start Time 7253    PT Stop Time 1100    PT Time Calculation (min) 46 min           Past Medical History:  Diagnosis Date  . Anxiety    07/2019  . Arthritis   . Depression 08/23/2019  . History of chicken pox   . Hyperlipidemia    2015  . 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 Stroke [facility])    2006    Past Surgical History:  Procedure Laterality Date  . cervical staph infection     2006  . COLONOSCOPY     2015  . INGUINAL HERNIA REPAIR     kindergarten    There were no vitals filed for this visit.   Subjective Assessment - 05/11/20 1021    Subjective Feeling pretty good but back is stiff, pt requested a heating pad to his low back before activity    Currently in Pain? Yes    Pain Score 5     Pain Location Back                             [facility] Adult PT Treatment/Exercise - 05/11/20 0001      Lumbar Exercises: Aerobic   Nustep L3x5 min      Lumbar Exercises: Standing   Other Standing Lumbar Exercises OHP yellow ball 2x10      Lumbar Exercises: Seated   Other Seated Lumbar Exercises OHP yellow ball      Knee/Hip Exercises: Standing   Forward Step Up Both;2 sets;10 reps;Hand Hold: 2;Step Height: 4"    Other Standing Knee Exercises Standing marches CGA 2x10      Knee/Hip Exercises: Seated   Abduction/Adduction  Strengthening;Both;2 sets;15 reps    Abd/Adduction Limitations blue    Sit to Sand 2 sets;10 reps;without UE support      Modalities   Modalities Moist Heat       Moist Heat Therapy   Number Minutes Moist Heat 10 Minutes    Moist Heat Location Lumbar Spine                    PT Short Term Goals - 05/11/20 1058      PT SHORT TERM GOAL #1   Title Pt will be independent with initial HEP    Status Achieved             PT Long Term Goals - 05/11/20 1059      PT LONG TERM GOAL #1   Title Pt will complete 5xSTS in </= 12 seconds to demo decreased falls risk.    Status On-going                 Plan - 05/11/20 1059    Clinical Impression Statement Continues progression form last treatment session. Pt is much weaker on L side compared to right. Some hesitation to try new interventions. UE assist needed to complete step  ups. He has increase difficulty flexing L knee and hip with standing marches. Cues to maintained erect posture needed what standing OHP with yellow weighted ball.  Cues needed to activate L hip with seated abduction.    Personal Factors and Comorbidities Comorbidity 3+;Fitness;Time since onset of injury/illness/exacerbation    Examination-Activity Limitations Bend;Locomotion Level;Reach Overhead;Transfers;Carry;Dressing;Hygiene/Grooming;Lift;Toileting;Stand;Stairs;Squat    Examination-Participation Restrictions Cleaning;Occupation;Community Activity;Laundry;Shop    Stability/Clinical Decision Making Evolving/Moderate complexity    Rehab Potential Good    PT Frequency 2x / week    PT Duration 6 weeks    PT Treatment/Interventions Iontophoresis 4mg /ml Dexamethasone;Moist Heat;Cryotherapy;Traction;Electrical Stimulation;Gait training;Stair training;Functional mobility training;Therapeutic activities;Therapeutic exercise;Balance training;Neuromuscular re-education;Manual techniques;Patient/family education;Taping;Dry needling;ADLs/Self Care Home Management    PT Next Visit Plan Tx focus to include LE strengthening (emphasis on proximal strength - core, hip ext/abd)  and balance as tolerated. High intensity gait training for  gait mechanics, endurance and stability as tolerated. Lumbar spine ROM and stabilization.  Modalities and manual as needed.           Patient will benefit from skilled therapeutic intervention in order to improve the following deficits and impairments:  Abnormal gait,Decreased coordination,Decreased range of motion,Difficulty walking,Increased fascial restricitons,Impaired tone,Impaired UE functional use,Increased muscle spasms,Decreased endurance,Decreased activity tolerance,Pain,Improper body mechanics,Impaired flexibility,Decreased balance,Decreased mobility,Decreased strength,Postural dysfunction  Visit Diagnosis: Muscle weakness (generalized)  Abnormal posture  Chronic low back pain without sciatica, unspecified back pain laterality  Repeated falls     Problem List Patient Active Problem List   Diagnosis Date Noted  . Left arm pain 04/19/2020  . Migraines 01/07/2020  . Depression 08/23/2019  . Vitamin D deficiency 08/25/2018  . Laceration of left hand 09/27/2017  . Acute pharyngitis 07/14/2017  . Chronic tonsillitis 07/12/2017  . Chronic pain syndrome 06/22/2017  . Bradycardia 06/22/2017  . Abnormal ECG 06/22/2017  . Smoker 06/21/2016  . Scalp pain 07/30/2015  . Muscle spasticity 06/24/2015  . Dandruff 06/24/2015  . Lymphadenitis 06/24/2015  . Burning with urination 01/25/2015  . Balanitis 01/25/2015  . Sleeping difficulties 02/04/2014  . Bladder neck obstruction 02/04/2014  . Hyperlipidemia 02/04/2014  . Other malaise and fatigue 07/24/2013  . Stroke ([place]) 07/24/2013  . Routine general medical examination at a health care facility 07/24/2013  . 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 Insomnia 10/12/2012  . Generalized anxiety disorder 10/12/2012  . Eczema 10/12/2012  . History of stroke 10/12/2012    Scot Jun, PTA 05/11/2020, 11:09 AM  [place]. [place], [place], [address] Phone:  [phone]   Fax:  [phone]  Name: Danny Hopkins MRN: [id number] Date of Birth: December 17, 1960

## 2020-05-13 ENCOUNTER — Ambulatory Visit: Payer: Medicare HMO | Admitting: Physical Therapy

## 2020-05-14 DIAGNOSIS — B079 Viral wart, unspecified: Secondary | ICD-10-CM | POA: Diagnosis not present

## 2020-05-18 ENCOUNTER — Ambulatory Visit: Payer: Medicare HMO | Admitting: Physical Therapy

## 2020-05-18 ENCOUNTER — Other Ambulatory Visit: Payer: Self-pay

## 2020-05-19 ENCOUNTER — Ambulatory Visit (INDEPENDENT_AMBULATORY_CARE_PROVIDER_SITE_OTHER): Payer: Medicare HMO | Admitting: Internal Medicine

## 2020-05-19 ENCOUNTER — Encounter: Payer: Self-pay | Admitting: Internal Medicine

## 2020-05-19 VITALS — BP 118/62 | HR 75 | Temp 98.2°F | Resp 18 | Ht 67.0 in | Wt 129.8 lb

## 2020-05-19 DIAGNOSIS — E538 Deficiency of other specified B group vitamins: Secondary | ICD-10-CM

## 2020-05-19 DIAGNOSIS — R634 Abnormal weight loss: Secondary | ICD-10-CM

## 2020-05-19 DIAGNOSIS — E7800 Pure hypercholesterolemia, unspecified: Secondary | ICD-10-CM

## 2020-05-19 DIAGNOSIS — Z0001 Encounter for general adult medical examination with abnormal findings: Secondary | ICD-10-CM

## 2020-05-19 DIAGNOSIS — Z87891 Personal history of nicotine dependence: Secondary | ICD-10-CM | POA: Diagnosis not present

## 2020-05-19 DIAGNOSIS — E559 Vitamin D deficiency, unspecified: Secondary | ICD-10-CM | POA: Diagnosis not present

## 2020-05-19 DIAGNOSIS — J3489 Other specified disorders of nose and nasal sinuses: Secondary | ICD-10-CM

## 2020-05-19 DIAGNOSIS — R69 Illness, unspecified: Secondary | ICD-10-CM | POA: Diagnosis not present

## 2020-05-19 DIAGNOSIS — Z0000 Encounter for general adult medical examination without abnormal findings: Secondary | ICD-10-CM | POA: Diagnosis not present

## 2020-05-19 DIAGNOSIS — F32A Depression, unspecified: Secondary | ICD-10-CM

## 2020-05-19 LAB — CBC WITH DIFFERENTIAL/PLATELET
Basophils Absolute: 0 10*3/uL (ref 0.0–0.1)
Basophils Relative: 0.5 % (ref 0.0–3.0)
Eosinophils Absolute: 0.1 10*3/uL (ref 0.0–0.7)
Eosinophils Relative: 1.2 % (ref 0.0–5.0)
HCT: 48.3 % (ref 39.0–52.0)
Hemoglobin: 16.3 g/dL (ref 13.0–17.0)
Lymphocytes Relative: 33.6 % (ref 12.0–46.0)
Lymphs Abs: 2 10*3/uL (ref 0.7–4.0)
MCHC: 33.8 g/dL (ref 30.0–36.0)
MCV: 91.2 fl (ref 78.0–100.0)
Monocytes Absolute: 0.6 10*3/uL (ref 0.1–1.0)
Monocytes Relative: 10.3 % (ref 3.0–12.0)
Neutro Abs: 3.2 10*3/uL (ref 1.4–7.7)
Neutrophils Relative %: 54.4 % (ref 43.0–77.0)
Platelets: 169 10*3/uL (ref 150.0–400.0)
RBC: 5.29 Mil/uL (ref 4.22–5.81)
RDW: 14 % (ref 11.5–15.5)
WBC: 5.8 10*3/uL (ref 4.0–10.5)

## 2020-05-19 LAB — TSH: TSH: 1.17 u[IU]/mL (ref 0.35–4.50)

## 2020-05-19 LAB — VITAMIN D 25 HYDROXY (VIT D DEFICIENCY, FRACTURES): VITD: 31.68 ng/mL (ref 30.00–100.00)

## 2020-05-19 LAB — LIPID PANEL
Cholesterol: 136 mg/dL (ref 0–200)
HDL: 48.9 mg/dL (ref >39.00)
LDL Cholesterol: 69 mg/dL (ref 0–99)
NonHDL: 87.06
Total CHOL/HDL Ratio: 3
Triglycerides: 89 mg/dL (ref 0.0–149.0)
VLDL: 17.8 mg/dL (ref 0.0–40.0)

## 2020-05-19 LAB — BASIC METABOLIC PANEL
BUN: 22 mg/dL (ref 6–23)
CO2: 34 mEq/L — ABNORMAL HIGH (ref 19–32)
Calcium: 9.6 mg/dL (ref 8.4–10.5)
Chloride: 100 mEq/L (ref 96–112)
Creatinine, Ser: 0.98 mg/dL (ref 0.40–1.50)
GFR: 85.31 mL/min (ref >60.00)
Glucose, Bld: 67 mg/dL — ABNORMAL LOW (ref 70–99)
Potassium: 4.3 mEq/L (ref 3.5–5.1)
Sodium: 140 mEq/L (ref 135–145)

## 2020-05-19 LAB — HEPATIC FUNCTION PANEL
ALT: 14 U/L (ref 0–53)
AST: 14 U/L (ref 0–37)
Albumin: 4.2 g/dL (ref 3.5–5.2)
Alkaline Phosphatase: 74 U/L (ref 39–117)
Bilirubin, Direct: 0.2 mg/dL (ref 0.0–0.3)
Total Bilirubin: 1 mg/dL (ref 0.2–1.2)
Total Protein: 6.6 g/dL (ref 6.0–8.3)

## 2020-05-19 LAB — PSA: PSA: 0.37 ng/mL (ref 0.10–4.00)

## 2020-05-19 LAB — VITAMIN B12: Vitamin B-12: 410 pg/mL (ref 211–911)

## 2020-05-19 MED ORDER — DOXYCYCLINE HYCLATE 100 MG PO TABS: 100.0000 mg | ORAL_TABLET | Freq: Two times a day (BID) | ORAL | 0 refills | Status: DC

## 2020-05-19 MED ORDER — MUPIROCIN CALCIUM 2 % NA OINT: 1.0000 "application " | TOPICAL_OINTMENT | Freq: Two times a day (BID) | NASAL | 0 refills | Status: DC

## 2020-05-19 MED ORDER — ALPRAZOLAM 1 MG PO TABS
ORAL_TABLET | ORAL | 2 refills | Status: DC
Start: 2020-05-19 — End: 2020-08-10

## 2020-05-19 NOTE — Patient Instructions (Signed)
Please take all new medication as prescribed - the nasal ointment, but also the pill antibiotic if not improving  Please continue all other medications as before, and refills have been done if requested.  Please have the pharmacy call with any other refills you may need.  Please continue your efforts at being more active, low cholesterol diet, and weight control.  You are otherwise up to date with prevention measures today.  Please keep your appointments with your specialists as you may have planned  You will be contacted regarding the referral for: CT scan for chest abdomen and pelvis  Please go to the LAB at the blood drawing area for the tests to be done today  You will be contacted by phone if any changes need to be made immediately.  Otherwise, you will receive a letter about your results with an explanation, but please check with MyChart first.  Please remember to sign up for MyChart if you have not done so, as this will be important to you in the future with finding out test results, communicating by private email, and scheduling acute appointments online when needed.  Please make an Appointment to return in 6 months, or sooner if needed

## 2020-05-20 ENCOUNTER — Ambulatory Visit: Payer: Medicare HMO

## 2020-05-20 ENCOUNTER — Encounter: Payer: Self-pay | Admitting: Internal Medicine

## 2020-05-20 LAB — URINALYSIS, ROUTINE W REFLEX MICROSCOPIC
Bilirubin Urine: NEGATIVE
Hgb urine dipstick: NEGATIVE
Ketones, ur: NEGATIVE
Leukocytes,Ua: NEGATIVE
Nitrite: NEGATIVE
Specific Gravity, Urine: 1.025 (ref 1.000–1.030)
Total Protein, Urine: NEGATIVE
Urine Glucose: 100 — AB
Urobilinogen, UA: 1 (ref 0.0–1.0)
pH: 6 (ref 5.0–8.0)

## 2020-05-21 ENCOUNTER — Encounter: Payer: Self-pay | Admitting: Internal Medicine

## 2020-05-21 DIAGNOSIS — J3489 Other specified disorders of nose and nasal sinuses: Secondary | ICD-10-CM | POA: Insufficient documentation

## 2020-05-21 NOTE — Assessment & Plan Note (Signed)
Exam benign, non smoker but rapid wt loss very recent, will need ct chest/abd/pelvis r/o malignancy

## 2020-05-21 NOTE — Assessment & Plan Note (Signed)
Has not picked this up again, encourage to remain quit,  to f/u any worsening symptoms or concerns

## 2020-05-21 NOTE — Assessment & Plan Note (Addendum)
Stable, pt declines worsening or need for med change, to cont current med tx - paxil   Current Outpatient Medications (Cardiovascular):  .  atorvastatin (LIPITOR) 20 MG tablet, TAKE 1 TABLET BY MOUTH EVERY DAy  Current Outpatient Medications (Respiratory):  .  mupirocin nasal ointment (BACTROBAN) 2 %, Place 1 application into the nose 2 (two) times daily. Use one-half of tube in each nostril twice daily for five (5) days. After application, press sides of nose together and gently massage.  Current Outpatient Medications (Analgesics):  .  HYDROcodone-acetaminophen (NORCO) 10-325 MG per tablet, Take 1 tablet by mouth every 8 (eight) hours as needed.  .  naproxen (NAPROSYN) 500 MG tablet, Take 1 tablet (500 mg total) by mouth 2 (two) times daily as needed for headache. .  SUMAtriptan (IMITREX) 100 MG tablet, Take 1 tablet (100 mg total) by mouth every 2 (two) hours as needed for migraine or headache. May repeat in 2 hours if headache persists or recurs.   Current Outpatient Medications (Other):  .  baclofen (LIORESAL) 10 MG tablet, Take by mouth every 4 (four) hours. .  clotrimazole-betamethasone (LOTRISONE) cream, APPLY TO AFFECTED AREA TWICE A DAY .  doxycycline (VIBRA-TABS) 100 MG tablet, Take 1 tablet (100 mg total) by mouth 2 (two) times daily. Marland Kitchen  omeprazole (PRILOSEC) 20 MG capsule, Take 1 capsule (20 mg total) by mouth daily as needed. Marland Kitchen  PARoxetine (PAXIL) 10 MG tablet, Take 1 tablet (10 mg total) by mouth daily. Marland Kitchen  SHINGRIX injection,  .  tamsulosin (FLOMAX) 0.4 MG CAPS capsule, TAKE 1 CAPSULE BY MOUTH EVERY DAY .  ALPRAZolam (XANAX) 1 MG tablet, 1 tab by mouth in the AM, and 1.5 tab in the PM as needed

## 2020-05-21 NOTE — Assessment & Plan Note (Signed)
High suspicion for mrsa - for mupirocin asd, and doxycycline course if not improved,  to f/u any worsening symptoms or concerns

## 2020-05-21 NOTE — Assessment & Plan Note (Signed)
Lab Results  Component Value Date   LDLCALC 69 05/19/2020   Stable, pt to continue current statin lipiitor 20

## 2020-05-21 NOTE — Assessment & Plan Note (Signed)

## 2020-05-21 NOTE — Assessment & Plan Note (Signed)
Last vitamin D Lab Results  Component Value Date   VD25OH 31.68 05/19/2020   Stable, cont oral replacement

## 2020-05-21 NOTE — Progress Notes (Signed)
Patient ID: Danny Hopkins, male   DOB: May 20, 1960, 60 y.o.   MRN: [id number]         Chief Complaint:: wellness exam and Yearly F/U (Rm 13. He noticed a knot in his nose last week, he stated that he can't feel it now but he would still like it to be examined. He feels like he got punched in nose, soreness when he moves his nose around. )  and wt loss, depreission, former smoker, hld , vit d deficiency       HPI:  Danny Hopkins is a 60 y.o. male here for wellness exam; pt is up to date with immunizations and preventive referrals                        Also c/o 1 wk worsening nasal soreness mostly right and medial nasal, mild to mod, sore to touch or move the nose, constant, dull, not better or worse with anything.  Has recent hx of what sound like abseces small to torso and groin area in the past few months with spontaneous improvement, but keep coming back.                 Also c/o 1-2 month unexplained worsening lower appetite ad wt loss to 129 over the last few weeks since jan 26, but denies worsening psychological symptoms like depression or anxiety, fever, chills, HA, ST, cough. Cp, sob, abd pain, n/v/d, blood or recent trauma. Denies worsening reflux, abd pain, dysphagia, bowel change or blood     Wt Readings from Last 3 Encounters:  05/19/20 129 lb 12.8 oz (58.9 kg)  04/29/20 132 lb 9.6 oz (60.1 kg)  04/22/20 137 lb 9.6 oz (62.4 kg)   BP Readings from Last 3 Encounters:  05/19/20 118/62  04/29/20 98/78  04/22/20 94/60   Immunization History  Administered Date(s) Administered  . Influenza Inj Mdck Quad Pf 12/09/2017  . Influenza,inj,Quad PF,6+ Mos 02/04/2014, 01/20/2015, 12/23/2015, 12/13/2016, 12/10/2018  . Influenza-Unspecified 03/29/2011, 02/02/2012, 03/28/2018, 12/30/2019  . PFIZER(Purple Top)SARS-COV-2 Vaccination 06/25/2019, 07/16/2019, 03/11/2020  . Td 12/23/2015  . Tdap 03/28/2009, 09/20/2017, 10/29/2018  . Zoster Recombinat (Shingrix) 10/29/2018, 04/21/2019  There  are no preventive care reminders to display for this patient.    Past Medical History:  Diagnosis Date  . Anxiety    07/2019  . Arthritis   . Depression 08/23/2019  . History of chicken pox   . Hyperlipidemia    2015  . 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 Stroke [facility])    2006   Past Surgical History:  Procedure Laterality Date  . cervical staph infection     2006  . COLONOSCOPY     2015  . INGUINAL HERNIA REPAIR     kindergarten    reports that he has never smoked. He has never used smokeless tobacco. He reports current alcohol use of about 2.0 standard drinks of alcohol per week. He reports previous drug use. Drug: Marijuana. family history is not on file. No Known Allergies Current Outpatient Medications on File Prior to Visit  Medication Sig Dispense Refill  . atorvastatin (LIPITOR) 20 MG tablet TAKE 1 TABLET BY MOUTH EVERY DAy 90 tablet 3  . baclofen (LIORESAL) 10 MG tablet Take by mouth every 4 (four) hours.    . clotrimazole-betamethasone (LOTRISONE) cream APPLY TO AFFECTED AREA TWICE A DAY 30 g 2  . HYDROcodone-acetaminophen (NORCO) 10-325 MG per tablet Take 1 tablet by mouth  every 8 (eight) hours as needed.     . naproxen (NAPROSYN) 500 MG tablet Take 1 tablet (500 mg total) by mouth 2 (two) times daily as needed for headache. 60 tablet 2  . omeprazole (PRILOSEC) 20 MG capsule Take 1 capsule (20 mg total) by mouth daily as needed. 30 capsule 11  . PARoxetine (PAXIL) 10 MG tablet Take 1 tablet (10 mg total) by mouth daily. 90 tablet 3  . SHINGRIX injection     . SUMAtriptan (IMITREX) 100 MG tablet Take 1 tablet (100 mg total) by mouth every 2 (two) hours as needed for migraine or headache. May repeat in 2 hours if headache persists or recurs. 10 tablet 11  . tamsulosin (FLOMAX) 0.4 MG CAPS capsule TAKE 1 CAPSULE BY MOUTH EVERY DAY 90 capsule 3   No current facility-administered medications on file prior to visit.        ROS:  All others  reviewed and negative.  Objective        PE:  BP 118/62   Pulse 75   Temp 98.2 F (36.8 C) (Oral)   Resp 18   Ht 5\' 7"  (1.702 m)   Wt 129 lb 12.8 oz (58.9 kg)   SpO2 98%   BMI 20.33 kg/m                 Constitutional: Pt appears in NAD               HENT: Head: NCAT.                Right Ear: External ear normal.                 Left Ear: External ear normal.                Eyes: . Pupils are equal, round, and reactive to light. Conjunctivae and EOM are normal               Nose: without d/c or deformity, with tedner sore tip of nose with trace swelling, no overt abscess or drainage               Neck: Neck supple. Gross normal ROM               Cardiovascular: Normal rate and regular rhythm.                 Pulmonary/Chest: Effort normal and breath sounds without rales or wheezing.                Abd:  Soft, NT, ND, + BS, no organomegaly               Neurological: Pt is alert. At baseline orientation, motor grossly intact               Skin: Skin is warm. No rashes, no other new lesions, LE edema - none               Psychiatric: Pt behavior is normal without agitation   Micro: none  Cardiac tracings I have personally interpreted today:  none  Pertinent Radiological findings (summarize): none   Lab Results  Component Value Date   WBC 5.8 05/19/2020   HGB 16.3 05/19/2020   HCT 48.3 05/19/2020   PLT 169.0 05/19/2020   GLUCOSE 67 (L) 05/19/2020   CHOL 136 05/19/2020   TRIG 89.0 05/19/2020   HDL 48.90 05/19/2020   LDLCALC 69 05/19/2020   ALT  14 05/19/2020   AST 14 05/19/2020   NA 140 05/19/2020   K 4.3 05/19/2020   CL 100 05/19/2020   CREATININE 0.98 05/19/2020   BUN 22 05/19/2020   CO2 34 (H) 05/19/2020   TSH 1.17 05/19/2020   PSA 0.37 05/19/2020   Assessment/Plan:  Danny Hopkins is a 60 y.o. Black or African American [2] male with  has a past medical history of Anxiety, Arthritis, Depression (08/23/2019), History of chicken pox, Hyperlipidemia, Neurologic  gait disorder (07/24/2013), and Stroke [facility]).  Encounter for well adult exam with abnormal findings Age and sex appropriate education and counseling updated with regular exercise and diet Referrals for preventative services - none needed Immunizations addressed - none needed Smoking counseling  - none needed Evidence for depression or other mood disorder - none significant Most recent labs reviewed. I have personally reviewed and have noted: 1) the patient's medical and social history 2) The patient's current medications and supplements 3) The patient's height, weight, and BMI have been recorded in the chart   Weight loss Exam benign, non smoker but rapid wt loss very recent, will need ct chest/abd/pelvis r/o malignancy  Depression Stable, pt declines worsening or need for med change, to cont current med tx - paxil   Current Outpatient Medications (Cardiovascular):  .  atorvastatin (LIPITOR) 20 MG tablet, TAKE 1 TABLET BY MOUTH EVERY DAy  Current Outpatient Medications (Respiratory):  .  mupirocin nasal ointment (BACTROBAN) 2 %, Place 1 application into the nose 2 (two) times daily. Use one-half of tube in each nostril twice daily for five (5) days. After application, press sides of nose together and gently massage.  Current Outpatient Medications (Analgesics):  .  HYDROcodone-acetaminophen (NORCO) 10-325 MG per tablet, Take 1 tablet by mouth every 8 (eight) hours as needed.  .  naproxen (NAPROSYN) 500 MG tablet, Take 1 tablet (500 mg total) by mouth 2 (two) times daily as needed for headache. .  SUMAtriptan (IMITREX) 100 MG tablet, Take 1 tablet (100 mg total) by mouth every 2 (two) hours as needed for migraine or headache. May repeat in 2 hours if headache persists or recurs.   Current Outpatient Medications (Other):  .  baclofen (LIORESAL) 10 MG tablet, Take by mouth every 4 (four) hours. .  clotrimazole-betamethasone (LOTRISONE) cream, APPLY TO AFFECTED AREA TWICE A DAY .   doxycycline (VIBRA-TABS) 100 MG tablet, Take 1 tablet (100 mg total) by mouth 2 (two) times daily. Marland Kitchen  omeprazole (PRILOSEC) 20 MG capsule, Take 1 capsule (20 mg total) by mouth daily as needed. Marland Kitchen  PARoxetine (PAXIL) 10 MG tablet, Take 1 tablet (10 mg total) by mouth daily. Marland Kitchen  SHINGRIX injection,  .  tamsulosin (FLOMAX) 0.4 MG CAPS capsule, TAKE 1 CAPSULE BY MOUTH EVERY DAY .  ALPRAZolam (XANAX) 1 MG tablet, 1 tab by mouth in the AM, and 1.5 tab in the PM as needed   Former smoker Has not picked this up again, encourage to remain quit,  to f/u any worsening symptoms or concerns  Hyperlipidemia Lab Results  Component Value Date   LDLCALC 69 05/19/2020   Stable, pt to continue current statin lipiitor 20   Vitamin D deficiency Last vitamin D Lab Results  Component Value Date   VD25OH 31.68 05/19/2020   Stable, cont oral replacement  Sore in nose High suspicion for mrsa - for mupirocin asd, and doxycycline course if not improved,  to f/u any worsening symptoms or concerns  Followup: Return in about 6 months (around  11/16/2020).  Cathlean Cower, MD 05/21/2020 9:31 PM [facility]

## 2020-05-25 ENCOUNTER — Other Ambulatory Visit: Payer: Self-pay

## 2020-05-25 ENCOUNTER — Other Ambulatory Visit: Payer: Self-pay | Admitting: Internal Medicine

## 2020-05-25 ENCOUNTER — Ambulatory Visit: Payer: Medicare HMO | Admitting: Physical Therapy

## 2020-05-25 ENCOUNTER — Ambulatory Visit (INDEPENDENT_AMBULATORY_CARE_PROVIDER_SITE_OTHER)
Admission: RE | Admit: 2020-05-25 | Discharge: 2020-05-25 | Disposition: A | Discharge status: Home / Self Care | Payer: Medicare HMO | Source: Ambulatory Visit | Attending: Internal Medicine | Admitting: Internal Medicine

## 2020-05-25 DIAGNOSIS — R634 Abnormal weight loss: Secondary | ICD-10-CM

## 2020-05-26 ENCOUNTER — Other Ambulatory Visit: Payer: Medicare HMO

## 2020-05-26 ENCOUNTER — Other Ambulatory Visit: Payer: Self-pay | Admitting: Internal Medicine

## 2020-05-26 NOTE — Progress Notes (Signed)
   Rito Ehrlich, am serving as a Education administrator for Dr. Lynne Leader.  Danny Hopkins is a 60 y.o. male who presents to [place] at [facility] today for f/u LBPand gait abnormalities. Pt suffered a stroke in 2006. Pt was last seen by Dr. Georgina Snell on 04/29/20 and was advised to continue PT of which he's completed 3 visits. Today, pt states that he can tell the physical therapy is helping and he has noticed he is able to go longer periods of time between his back feeling weak. Patient states that he is able to stand straight up for longer periods of time before he starts feeling week and bending over.  LE numbness/tingling: no LE weakness: yes Aggravates: standing, whenever he gets tired Rx tried: Hydrocodone, Voltaren gel  Dx imaging: 04/29/20 L-spine XR             03/31/10 L-spine MRI  Pertinent review of systems: No fevers or chills  Relevant historical information: History of stroke resulted in leg weakness   Exam:  BP 100/70 (BP Location: Left Arm, Patient Position: Sitting, Cuff Size: Normal)   Pulse 70   Ht 5\' 7"  (1.702 m)   Wt 130 lb (59 kg)   SpO2 98%   BMI 20.36 kg/m  General: Well Developed, well nourished, and in no acute distress.   MSK: L-spine normal-appearing Nontender midline. Decreased lumbar range of motion however improved extension compared to last visit.        Assessment and Plan: 60 y.o. male with low back pain improving with physical therapy.  Plan to continue PT as patient is not fully improved.  Recheck back in 6 weeks.  If not significantly improved enough neck step would be MRI lumbar spine for potential injection planning.    Discussed warning signs or symptoms. Please see discharge instructions. Patient expresses understanding.   The above documentation has been reviewed and is accurate and complete Lynne Leader, M.D.  Total encounter time 20 minutes including face-to-face time with the patient and, reviewing past medical  record, and charting on the date of service.   Discussed treatment plan and options.

## 2020-05-27 ENCOUNTER — Encounter: Payer: Self-pay | Admitting: Internal Medicine

## 2020-05-27 ENCOUNTER — Telehealth: Payer: Self-pay | Admitting: Internal Medicine

## 2020-05-27 ENCOUNTER — Ambulatory Visit (INDEPENDENT_AMBULATORY_CARE_PROVIDER_SITE_OTHER): Payer: Medicare HMO | Admitting: Family Medicine

## 2020-05-27 ENCOUNTER — Other Ambulatory Visit: Payer: Self-pay

## 2020-05-27 ENCOUNTER — Telehealth: Payer: Self-pay

## 2020-05-27 ENCOUNTER — Encounter: Payer: Self-pay | Admitting: Family Medicine

## 2020-05-27 VITALS — BP 100/70 | HR 70 | Ht 67.0 in | Wt 130.0 lb

## 2020-05-27 DIAGNOSIS — M545 Low back pain, unspecified: Secondary | ICD-10-CM

## 2020-05-27 NOTE — Patient Instructions (Signed)
Thank you for coming in today.  Recheck in 6 weeks.   Continue PT and home exercises.   If not better next step is MRI.

## 2020-05-27 NOTE — Telephone Encounter (Signed)
Patient was supposed to get a CT scan and they cancelled his appointment because they said his insurance states he needs a chest xray first, he got the xray done and insurance is still saying they will not cover it so he is wondering what needs to be done.

## 2020-05-27 NOTE — Telephone Encounter (Signed)
I dont have anything else to offer at this time.  Please return for any worsening weight or other problems

## 2020-05-27 NOTE — Telephone Encounter (Signed)
Patient arrived to office to see Dr. Jenny Reichmann patient not scheduled and notified of Dr. Jenny Reichmann last Note     I dont have anything else to offer at this time.  Please return for any worsening weight or other problems

## 2020-05-28 DIAGNOSIS — L600 Ingrowing nail: Secondary | ICD-10-CM | POA: Diagnosis not present

## 2020-06-05 ENCOUNTER — Telehealth: Payer: Self-pay | Admitting: Internal Medicine

## 2020-06-05 DIAGNOSIS — Z87891 Personal history of nicotine dependence: Secondary | ICD-10-CM | POA: Diagnosis not present

## 2020-06-05 DIAGNOSIS — Z833 Family history of diabetes mellitus: Secondary | ICD-10-CM | POA: Diagnosis not present

## 2020-06-05 DIAGNOSIS — G8929 Other chronic pain: Secondary | ICD-10-CM | POA: Diagnosis not present

## 2020-06-05 DIAGNOSIS — E785 Hyperlipidemia, unspecified: Secondary | ICD-10-CM | POA: Diagnosis not present

## 2020-06-05 DIAGNOSIS — K219 Gastro-esophageal reflux disease without esophagitis: Secondary | ICD-10-CM | POA: Diagnosis not present

## 2020-06-05 DIAGNOSIS — K5900 Constipation, unspecified: Secondary | ICD-10-CM | POA: Diagnosis not present

## 2020-06-05 DIAGNOSIS — N529 Male erectile dysfunction, unspecified: Secondary | ICD-10-CM | POA: Diagnosis not present

## 2020-06-05 DIAGNOSIS — I69354 Hemiplegia and hemiparesis following cerebral infarction affecting left non-dominant side: Secondary | ICD-10-CM | POA: Diagnosis not present

## 2020-06-05 DIAGNOSIS — R69 Illness, unspecified: Secondary | ICD-10-CM | POA: Diagnosis not present

## 2020-06-05 DIAGNOSIS — N400 Enlarged prostate without lower urinary tract symptoms: Secondary | ICD-10-CM | POA: Diagnosis not present

## 2020-06-05 NOTE — Telephone Encounter (Signed)
Patient requesting prior auth be obtained for previously ordered  CT scan

## 2020-06-05 NOTE — Telephone Encounter (Signed)
Patient notified not Ct Scan prior auth approved

## 2020-06-05 NOTE — Telephone Encounter (Signed)
Uffortunately I dont have anything else to offer to justify besides wt loss and they have already turned it down

## 2020-06-08 NOTE — Telephone Encounter (Signed)
[employer] calling and says that the PA was denied. They filed a appeal and we need to fax over some information for the appeal. Medical records pertaining the issue of the patients weight loss.  Fax [phone]

## 2020-06-08 NOTE — Telephone Encounter (Signed)
[employer] called back with an appeal authorization number. I will call the pt and we will get the CT scan scheduled

## 2020-06-08 NOTE — Telephone Encounter (Signed)
Ok with me 

## 2020-06-08 NOTE — Telephone Encounter (Signed)
Yes. Holland Falling also left a me a vm regarding this. I called back an left a detailed vm stating we already sent in everything we had when we initially did the pre cert and that he had another appointment with [place] on 3/16.

## 2020-06-10 ENCOUNTER — Encounter: Payer: Self-pay | Admitting: Internal Medicine

## 2020-06-10 ENCOUNTER — Ambulatory Visit (INDEPENDENT_AMBULATORY_CARE_PROVIDER_SITE_OTHER): Payer: Medicare HMO | Admitting: Internal Medicine

## 2020-06-10 ENCOUNTER — Other Ambulatory Visit: Payer: Self-pay

## 2020-06-10 VITALS — BP 120/78 | HR 54 | Temp 98.1°F | Ht 67.0 in | Wt 134.0 lb

## 2020-06-10 DIAGNOSIS — R634 Abnormal weight loss: Secondary | ICD-10-CM | POA: Diagnosis not present

## 2020-06-10 DIAGNOSIS — F32A Depression, unspecified: Secondary | ICD-10-CM

## 2020-06-10 DIAGNOSIS — J3489 Other specified disorders of nose and nasal sinuses: Secondary | ICD-10-CM

## 2020-06-10 DIAGNOSIS — E559 Vitamin D deficiency, unspecified: Secondary | ICD-10-CM

## 2020-06-10 DIAGNOSIS — R69 Illness, unspecified: Secondary | ICD-10-CM | POA: Diagnosis not present

## 2020-06-10 DIAGNOSIS — R1310 Dysphagia, unspecified: Secondary | ICD-10-CM | POA: Insufficient documentation

## 2020-06-10 NOTE — Assessment & Plan Note (Signed)
Mild low normal ok to increase the vit d to 4000 u qd

## 2020-06-10 NOTE — Assessment & Plan Note (Signed)
Stable overall, cont paxil, delcnies need for referral

## 2020-06-10 NOTE — Assessment & Plan Note (Signed)
Near resolved, no further antibx needed, pt reassured,  to f/u any worsening symptoms or concerns

## 2020-06-10 NOTE — Patient Instructions (Signed)
Ok to increase the Vitamin D to 4000 units per day  Ok to take the hydrocodone in applesauce if that works better  Please continue all other medications as before, and refills have been done if requested.  Please have the pharmacy call with any other refills you may need.  Please continue your efforts at being more active, low cholesterol diet, and weight control.  You are otherwise up to date with prevention measures today.  Please keep your appointments with your specialists as you may have planned - the CT scan on Mar 29

## 2020-06-10 NOTE — Assessment & Plan Note (Signed)
?   Some improved, for f/u CT as ordered mar 29

## 2020-06-10 NOTE — Progress Notes (Signed)
Patient ID: Danny Hopkins, male   DOB: 12/23/1960, 60 y.o.   MRN: [id number]        Chief Complaint: follow up nasal sore, dysphagia, wt loss, low vit d       HPI:  Danny Hopkins is a 60 y.o. male here after recent visit with multiple issues, overal improved today s/p mupirocin/doxy course with right nasal sore infection now resolved except he can feel a kind of scab in the right nostril aree he tries to leave alone, and denies worsening fever, swelling, bleeding, pain or congestion.  Tolerated antibx well.  Currently taking vit D 2000 u qd.  Does still have dysphagia that is markedly noted but only in the right neck area and only with hydrocodone pill, even if he cuts it in half. It clearly gets hung up stuck each time and has to really work on grunting and swallowing to dislodge it and get it down.  Has no other trouble with any other pills, and none with solids or liquids otherwise.  Wt has now improved several lbs since last visit. Appetite maybe some improved for unclear reasons. Has CT planned for wt loss mar 29.  Recent CXR neg for acute.  Pt denies chest pain, increased sob or doe, wheezing, orthopnea, PND, increased LE swelling, palpitations, dizziness or syncope.  Denies worsening new focal neuro s/s.   Pt denies polydipsia, polyuria, or low sugar symptoms such as weakness or confusion improved with po intake.  Pt states overall good compliance with meds, trying to follow lower cholesterol, diabetic diet, wt overall stable but little exercise however.   Recent labs reveiwed with pt     Denies worsening depressive symptoms, suicidal ideation, or panic; has ongoing anxiety, Wt Readings from Last 3 Encounters:  06/10/20 134 lb (60.8 kg)  05/27/20 130 lb (59 kg)  05/19/20 129 lb 12.8 oz (58.9 kg)   BP Readings from Last 3 Encounters:  06/10/20 120/78  05/27/20 100/70  05/19/20 118/62         Past Medical History:  Diagnosis Date  . Anxiety    07/2019  . Arthritis   . Depression  08/23/2019  . History of chicken pox   . Hyperlipidemia    2015  . 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 Stroke [facility])    2006   Past Surgical History:  Procedure Laterality Date  . cervical staph infection     2006  . COLONOSCOPY     2015  . INGUINAL HERNIA REPAIR     kindergarten    reports that he has never smoked. He has never used smokeless tobacco. He reports current alcohol use of about 2.0 standard drinks of alcohol per week. He reports previous drug use. Drug: Marijuana. family history is not on file. No Known Allergies Current Outpatient Medications on File Prior to Visit  Medication Sig Dispense Refill  . ALPRAZolam (XANAX) 1 MG tablet 1 tab by mouth in the AM, and 1.5 tab in the PM as needed 75 tablet 2  . atorvastatin (LIPITOR) 20 MG tablet TAKE 1 TABLET BY MOUTH EVERY DAy 90 tablet 3  . baclofen (LIORESAL) 10 MG tablet Take by mouth every 4 (four) hours.    . clotrimazole-betamethasone (LOTRISONE) cream APPLY TO AFFECTED AREA TWICE A DAY 30 g 2  . HYDROcodone-acetaminophen (NORCO) 10-325 MG per tablet Take 1 tablet by mouth every 8 (eight) hours as needed.     . mupirocin nasal ointment (BACTROBAN) 2 %  Place 1 application into the nose 2 (two) times daily. Use one-half of tube in each nostril twice daily for five (5) days. After application, press sides of nose together and gently massage. 10 g 0  . naproxen (NAPROSYN) 500 MG tablet TAKE 1 TABLET (500 MG TOTAL) BY MOUTH 2 (TWO) TIMES DAILY AS NEEDED FOR HEADACHE. 60 tablet 2  . omeprazole (PRILOSEC) 20 MG capsule Take 1 capsule (20 mg total) by mouth daily as needed. 30 capsule 11  . PARoxetine (PAXIL) 10 MG tablet Take 1 tablet (10 mg total) by mouth daily. 90 tablet 3  . SHINGRIX injection     . SUMAtriptan (IMITREX) 100 MG tablet Take 1 tablet (100 mg total) by mouth every 2 (two) hours as needed for migraine or headache. May repeat in 2 hours if headache persists or recurs. 10 tablet 11  .  tamsulosin (FLOMAX) 0.4 MG CAPS capsule TAKE 1 CAPSULE BY MOUTH EVERY DAY 90 capsule 3   No current facility-administered medications on file prior to visit.        ROS:  All others reviewed and negative.  Objective        PE:  BP 120/78   Pulse (!) 54   Temp 98.1 F (36.7 C) (Oral)   Ht 5\' 7"  (1.702 m)   Wt 134 lb (60.8 kg)   SpO2 92%   BMI 20.99 kg/m                 Constitutional: Pt appears in NAD               HENT: Head: NCAT.                Right Ear: External ear normal.                 Left Ear: External ear normal.                Eyes: . Pupils are equal, round, and reactive to light. Conjunctivae and EOM are normal               Nose: without d/c or deformity               Neck: Neck supple. Gross normal ROM               Cardiovascular: Normal rate and regular rhythm.                 Pulmonary/Chest: Effort normal and breath sounds without rales or wheezing.                Abd:  Soft, NT, ND, + BS, no organomegaly               Neurological: Pt is alert. At baseline orientation, motor grossly intact               Skin: Skin is warm. No rashes, no other new lesions, LE edema - none               Psychiatric: Pt behavior is normal without agitation   Micro: none  Cardiac tracings I have personally interpreted today:  none  Pertinent Radiological findings (summarize): CXR- 05/25/2020  IMPRESSION: No acute chest findings. No explanation for weight loss   Lab Results  Component Value Date   WBC 5.8 05/19/2020   HGB 16.3 05/19/2020   HCT 48.3 05/19/2020   PLT 169.0 05/19/2020   GLUCOSE 67 (L) 05/19/2020   CHOL  136 05/19/2020   TRIG 89.0 05/19/2020   HDL 48.90 05/19/2020   LDLCALC 69 05/19/2020   ALT 14 05/19/2020   AST 14 05/19/2020   NA 140 05/19/2020   K 4.3 05/19/2020   CL 100 05/19/2020   CREATININE 0.98 05/19/2020   BUN 22 05/19/2020   CO2 34 (H) 05/19/2020   TSH 1.17 05/19/2020   PSA 0.37 05/19/2020   Assessment/Plan:  Danny Hopkins is a 60  y.o. Black or African American [2] male with  has a past medical history of Anxiety, Arthritis, Depression (08/23/2019), History of chicken pox, Hyperlipidemia, Neurologic gait disorder (07/24/2013), and Stroke [facility]).  Vitamin D deficiency Mild low normal ok to increase the vit d to 4000 u qd   Weight loss ? Some improved, for f/u CT as ordered mar 29  Depression Stable overall, cont paxil, delcnies need for referral  Sore in nose Near resolved, no further antibx needed, pt reassured,  to f/u any worsening symptoms or concerns   Dysphagia To hydrocdone only, ok to crush with applesauce to tolerate better,  to f/u any worsening symptoms or concerns  Followup: Return in about 5 months (around 11/16/2020).  Cathlean Cower, MD 06/10/2020 9:00 AM [facility]

## 2020-06-10 NOTE — Assessment & Plan Note (Signed)
To hydrocdone only, ok to crush with applesauce to tolerate better,  to f/u any worsening symptoms or concerns

## 2020-06-17 ENCOUNTER — Ambulatory Visit: Payer: Medicare HMO | Admitting: Family Medicine

## 2020-06-23 ENCOUNTER — Other Ambulatory Visit: Payer: Self-pay

## 2020-06-23 ENCOUNTER — Ambulatory Visit
Admission: RE | Admit: 2020-06-23 | Discharge: 2020-06-23 | Disposition: A | Discharge status: Home / Self Care | Payer: Medicare HMO | Source: Ambulatory Visit | Attending: Internal Medicine | Admitting: Internal Medicine

## 2020-06-23 DIAGNOSIS — R634 Abnormal weight loss: Secondary | ICD-10-CM

## 2020-06-23 DIAGNOSIS — R1110 Vomiting, unspecified: Secondary | ICD-10-CM | POA: Diagnosis not present

## 2020-06-23 DIAGNOSIS — I700 Atherosclerosis of aorta: Secondary | ICD-10-CM | POA: Diagnosis not present

## 2020-06-23 DIAGNOSIS — K7689 Other specified diseases of liver: Secondary | ICD-10-CM | POA: Diagnosis not present

## 2020-06-23 MED ORDER — IOPAMIDOL (ISOVUE-300) INJECTION 61%
100.0000 mL | Freq: Once | INTRAVENOUS | Status: AC | PRN
  Administered 2020-06-23: 100 mL via INTRAVENOUS

## 2020-06-24 ENCOUNTER — Encounter: Payer: Self-pay | Admitting: Internal Medicine

## 2020-07-01 ENCOUNTER — Telehealth: Payer: Self-pay | Admitting: Internal Medicine

## 2020-07-01 NOTE — Telephone Encounter (Signed)
Good news - it was all negative  IMPRESSION: No significant abnormality is noted in the chest, abdomen or pelvis.

## 2020-07-01 NOTE — Telephone Encounter (Signed)
Patient calling requesting the results from his CT scan from 03.29.22

## 2020-07-02 NOTE — Telephone Encounter (Signed)
Patient notified and verbalizes understanding.

## 2020-07-07 NOTE — Progress Notes (Deleted)
   I, Peterson Lombard, LAT, ATC acting as a scribe for Lynne Leader, MD.  Danny Hopkins is a 60 y.o. male who presents to [place] at [facility] today for f/u low back pain. Pt was last seen by Dr. Georgina Snell on 05/27/20 and was advised to cont PT. Today, pt reports   Dx imaging: 04/29/20 L-spine XR  03/31/10 L-spine MRI  Pertinent review of systems: ***  Relevant historical information: ***   Exam:  There were no vitals taken for this visit. General: Well Developed, well nourished, and in no acute distress.   MSK: ***    Lab and Radiology Results No results found for this or any previous visit (from the past 72 hour(s)). No results found.     Assessment and Plan: 60 y.o. male with ***   PDMP not reviewed this encounter. No orders of the defined types were placed in this encounter.  No orders of the defined types were placed in this encounter.    Discussed warning signs or symptoms. Please see discharge instructions. Patient expresses understanding.   ***

## 2020-07-08 ENCOUNTER — Ambulatory Visit: Payer: Medicare HMO | Admitting: Family Medicine

## 2020-07-14 ENCOUNTER — Encounter: Payer: Self-pay | Admitting: Family Medicine

## 2020-07-14 ENCOUNTER — Other Ambulatory Visit: Payer: Self-pay

## 2020-07-14 ENCOUNTER — Ambulatory Visit: Payer: Medicare HMO | Admitting: Family Medicine

## 2020-07-14 VITALS — BP 104/64 | HR 53 | Ht 67.0 in | Wt 136.6 lb

## 2020-07-14 DIAGNOSIS — M545 Low back pain, unspecified: Secondary | ICD-10-CM

## 2020-07-14 DIAGNOSIS — G8929 Other chronic pain: Secondary | ICD-10-CM

## 2020-07-14 NOTE — Progress Notes (Signed)
   I, Wendy Poet, LAT, ATC, am serving as scribe for Dr. Lynne Leader.  Danny Hopkins is a 60 y.o. male who presents to [facility] today for f/u bilateral low back pain and gait abnormalities. Pt suffered a stroke in 2006. Pt was last seen by Dr. Georgina Snell on 05/27/20 and was advised to cont PT of which he completed 0 additional visits. Today, pt reports that he's been feeling better in terms of pain but notes that he's having a hard time standing up straight after the first hour that he's awake.  He has been doing his HEP which is why he hasn't returned to PT.  He states that his back feels more stiff than painful at this point.  He notes after he has been standing for about an hour or 2 in the morning his back feels weak and fatigued and stiff.  Its not very painful.  Dx imaging: 04/29/20 L-spine XR  03/31/10 L-spine MRI  Pertinent review of systems: No fevers or chills  Relevant historical information: History of stroke   Exam:  BP 104/64 (BP Location: Right Arm, Patient Position: Sitting, Cuff Size: Normal)   Pulse (!) 53   Ht 5\' 7"  (1.702 m)   Wt 136 lb 9.6 oz (62 kg)   SpO2 97%   BMI 21.39 kg/m  General: Well Developed, well nourished, and in no acute distress.   MSK: L-spine nontender.  Lacks full extension range of motion.    Lab and Radiology Results  EXAM: LUMBAR SPINE - 3 VIEW  COMPARISON:  12/04/2006  FINDINGS: Five lumbar type vertebral bodies are well visualized. Vertebral body height is well maintained. Mild osteophytic changes are seen. No soft tissue abnormality is noted. Mild scoliosis concave to the left is noted.  IMPRESSION: Mild degenerative change without acute abnormality   Electronically Signed   By: Inez Catalina M.D.   On: 04/29/2020 23:49 I, Lynne Leader, personally (independently) visualized and performed the interpretation of the images attached in this note.     Assessment and Plan: 60 y.o. male with  lumbar pain/weakness.  Fundamentally I believe the symptoms that Danny Hopkins is experiencing is due to fundamental core weakness.  He experiences inability to fully extend his back and stand up straight after he has been standing for an hour or 2.  I believe this is core weakness.  Some of this is from his stroke but I think majority is muscle fatigue.  He had improvement with very limited early PT and I think will get significantly improved with more PT.  Encouraged him to complete physical therapy course.  New referral placed today.  Recheck in about 2 months. If not improved consider MRI to further evaluate cause of L-spine dysfunction and for potential facet injection planning.  PDMP not reviewed this encounter. Orders Placed This Encounter  Procedures  . Ambulatory referral to Physical Therapy    Referral Priority:   Routine    Referral Type:   Physical Medicine    Referral Reason:   Specialty Services Required    Requested Specialty:   Physical Therapy   No orders of the defined types were placed in this encounter.    Discussed warning signs or symptoms. Please see discharge instructions. Patient expresses understanding.   The above documentation has been reviewed and is accurate and complete Lynne Leader, M.D.

## 2020-07-14 NOTE — Patient Instructions (Signed)
Thank you for coming in today.  Work on strength with PT.   Recheck in 2 months.

## 2020-07-16 ENCOUNTER — Other Ambulatory Visit: Payer: Self-pay | Admitting: Internal Medicine

## 2020-07-20 ENCOUNTER — Telehealth: Payer: Self-pay | Admitting: Internal Medicine

## 2020-07-20 DIAGNOSIS — R269 Unspecified abnormalities of gait and mobility: Secondary | ICD-10-CM

## 2020-07-20 DIAGNOSIS — G894 Chronic pain syndrome: Secondary | ICD-10-CM

## 2020-07-20 DIAGNOSIS — Z8673 Personal history of transient ischemic attack (TIA), and cerebral infarction without residual deficits: Secondary | ICD-10-CM

## 2020-07-20 NOTE — Telephone Encounter (Signed)
Patient calling inquiring about in home health care.

## 2020-07-20 NOTE — Telephone Encounter (Signed)
Patient called and had some questions about a living will. He can be reached at [phone]. Please advise

## 2020-07-20 NOTE — Telephone Encounter (Signed)
Since he was here mar 22, we could order [place] with RN, PT and aide though he should remember this kind of [place] [employer] is always temporary until he is improved;   so just let me know

## 2020-07-21 NOTE — Telephone Encounter (Signed)
Spoke with patient and he wishes to proceed with HH, RN, and PT

## 2020-07-22 NOTE — Addendum Note (Signed)
Addended by: Biagio Borg on: 07/22/2020 05:33 PM   Modules accepted: Orders

## 2020-07-24 NOTE — Telephone Encounter (Signed)
Patient called and was wondering if he could do just home PT. He was wondering when he would be contacted so he knows if he needs to start at [place]. Please advise

## 2020-07-24 NOTE — Telephone Encounter (Signed)
Patient notified to continue care as is

## 2020-07-24 NOTE — Telephone Encounter (Signed)
We should continue as is for now, and the RN and aide would not come back if they determine no need to do so

## 2020-07-27 ENCOUNTER — Other Ambulatory Visit: Payer: Self-pay

## 2020-07-27 ENCOUNTER — Ambulatory Visit: Payer: Medicare HMO | Attending: Family Medicine

## 2020-07-27 DIAGNOSIS — M545 Low back pain, unspecified: Secondary | ICD-10-CM | POA: Insufficient documentation

## 2020-07-27 DIAGNOSIS — R293 Abnormal posture: Secondary | ICD-10-CM | POA: Insufficient documentation

## 2020-07-27 DIAGNOSIS — R296 Repeated falls: Secondary | ICD-10-CM | POA: Insufficient documentation

## 2020-07-27 DIAGNOSIS — R2681 Unsteadiness on feet: Secondary | ICD-10-CM | POA: Diagnosis present

## 2020-07-27 DIAGNOSIS — M6281 Muscle weakness (generalized): Secondary | ICD-10-CM | POA: Diagnosis present

## 2020-07-27 DIAGNOSIS — R2689 Other abnormalities of gait and mobility: Secondary | ICD-10-CM | POA: Diagnosis present

## 2020-07-27 DIAGNOSIS — G8929 Other chronic pain: Secondary | ICD-10-CM | POA: Insufficient documentation

## 2020-07-27 NOTE — Patient Instructions (Signed)
Access Code: [email or web address] URL: [email or web address] Date: 07/27/2020 Prepared by: Sherlynn Stalls  Exercises Supine Bridge - 1 x daily - 7 x weekly - 3 sets - 10 reps - 3- 5 second hold Supine Lower Trunk Rotation - 1 x daily - 7 x weekly - 3 sets - 10 reps Sit to Stand with Resistance Around Legs (RED TB)- 1 x daily - 7 x weekly - 3 sets - 10 reps Seated Hip Abduction with Resistance - 1 x daily - 7 x weekly - 3 sets - 10 reps Standing March with Counter Support - 1 x daily - 7 x weekly - 3 sets - 10 reps Standing Hip Abduction with Counter Support - 1 x daily - 7 x weekly - 3 sets - 10 reps Heat - 1 x daily - 7 x weekly - 10-20 minutes hold

## 2020-07-27 NOTE — Telephone Encounter (Signed)
Ok with me 

## 2020-07-27 NOTE — Therapy (Signed)
[place], [place], [address] Phone: [phone]   Fax:  [phone]  Physical Therapy Evaluation  Patient Details  Name: Danny Hopkins MRN: [id number] Date of Birth: 17-Sep-1960 Referring Provider (PT): Marikay Alar Date: 07/27/2020   PT End of Session - 07/27/20 1342    Visit Number 1    Number of Visits 13    Date for PT Re-Evaluation 09/14/20    PT Start Time 1316    PT Stop Time 1357    PT Time Calculation (min) 41 min    Activity Tolerance Patient tolerated treatment well    Behavior During Therapy [facility] for tasks assessed/performed           Past Medical History:  Diagnosis Date  . Anxiety    07/2019  . Arthritis   . Depression 08/23/2019  . History of chicken pox   . Hyperlipidemia    2015  . 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 Stroke [facility])    2006    Past Surgical History:  Procedure Laterality Date  . cervical staph infection     2006  . COLONOSCOPY     2015  . INGUINAL HERNIA REPAIR     kindergarten    There were no vitals filed for this visit.    Subjective Assessment - 07/27/20 1320    Subjective Was doing pretty well with home exercises from last time  and doing more car detailing but over time has been getting tired. Has about an hour after waking up before starting to slump down. Back gets sore and it just hurts.  Stumbles more when especailly when he gets tired "its like im drunk", will walk sideways. back is usually a bit sore in the morning since he gets alot of spasms when laying down at night.    Pertinent History Stroke 2006 with LUE/LLE involvement. Middle low back pain which began after stroke. He has spasticity all along his left side. 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 Yes    Pain Score 5     Pain Location Back    Pain Orientation Lower    Pain Descriptors / Indicators Tightness   "feels like something is poking out of my skin"   Pain Type Chronic pain    Pain Relieving Factors wears a back brace when going to walk. pain medication              [facility] PT Assessment - 07/27/20 0001      Assessment   Medical Diagnosis M54.50,G89.29 (ICD-10-CM) - Chronic midline low back pain without sciatica  Georgina Snell    Referring Provider (PT) Georgina Snell    Onset Date/Surgical Date --   2006 - stroke   Hand Dominance Right      Precautions   Precautions Fall      Balance Screen   Has the patient fallen in the past 6 months Yes    Has the patient had a decrease in activity level because of a fear of falling?  Yes    Is the patient reluctant to leave their home because of a fear of falling?  Yes      Home  [place] residence    Living Arrangements Alone    Available Help at Discharge Friend(s)    Type of Home Apartment    Home Access Level entry    Additional Comments Much difficulty to do stairs, worried about legs buckling  especially with descent      Prior Function   Level of Independence --   Modified independent with ADLs, gait, transfers post stroke. Pt report decreasing function and balance since december.   Vocation Full time Optician, dispensing detailing   recently more difficult to get around. Was able to get to do 1-2 while sitting inside car earlier in the year but this stopped with more fatigue   Leisure detailing cars, watching tv, walking the dog      Cognition   Overall Cognitive Status Within Functional Limits for tasks assessed      Posture/Postural Control   Posture/Postural Control Postural limitations    Postural Limitations Rounded Shoulders;Forward head;Decreased lumbar lordosis;Posterior pelvic tilt;Flexed  trunk;Weight shift right      AROM   Overall AROM Comments --   LUE. Spastic flexion pattern kicks in at 90 deg flex/abd with minimal control to keep hand from hitting head Lumbar ROM: Grossly limited:     Strength   Right Hip Flexion 4/5    Left Hip Flexion 2/5    Right Knee Flexion 4+/5    Right Knee Extension 4+/5    Left Knee Flexion 3+/5    Left Knee Extension 3-/5    Lumbar Flexion 2+/5    Lumbar Extension 2+/5      Flexibility   Soft Tissue Assessment /Muscle Length yes    Hamstrings Miderate tightness L>R      Transfers   Five time sit to stand comments  23"   > 15 sec = risk of falls.  Alot of compensations, valgus     Ambulation/Gait   Ambulation/Gait Assistance 6: Modified independent (Device/Increase time)   Increased time. Gait belt. VCs for upright posture. Increased scissoring with turns.   Stairs Yes    Stairs Assistance 6: Modified independent (Device/Increase time);5: Supervision    Stairs Assistance Details (indicate cue type and reason) CS-CGA    Stair Management Technique Two rails;Step to pattern;Forwards   Right lead up, left lead down   Height of Stairs 4   and 6   Gait Comments Crouched gait, general instability with frequent lateral deviations with attempt to ambulate straight path. slight circumduction L, diminished ws left. increasing crouch with prolonged standing , dcrd endurance      Balance   Balance Assessed Yes      Standardized Balance Assessment   Standardized Balance Assessment Timed Up and Go Test;Berg Balance Test      Berg Balance Test   Sit to Stand Able to stand using hands after several tries    Standing Unsupported Able to stand 2 minutes with supervision    Sitting with Back Unsupported but Feet Supported on Floor or Stool Able to sit safely and securely 2 minutes    Stand to Sit Controls descent by using hands    Transfers Able to transfer safely, definite need of hands    Standing Unsupported with Eyes Closed Able to stand 10  seconds with supervision    Standing Unsupported with Feet Together Able to place feet together independently and stand for 1 minute with supervision    From Standing, Reach Forward with Outstretched Arm Can  reach forward >12 cm safely (5")    From Standing Position, Pick up Object from [place] to pick up shoe, needs supervision    From Standing Position, Turn to Look Behind Over each Shoulder Looks behind one side only/other side shows less weight shift    Turn 360 Degrees Able to turn 360 degrees safely but slowly    Standing Unsupported, Alternately Place Feet on Step/Stool Able to complete >2 steps/needs minimal assist    Standing Unsupported, One Foot in [employer] balance while stepping or standing    Standing on One Leg Unable to try or needs assist to prevent fall    Total Score 33    Berg comment: High fall risk      Timed Up and Go Test   TUG Normal TUG    Normal TUG (seconds) 18   Normal: >13.5 sec indicates high fall risk     LUE Tone   LUE Tone Moderate   Minimal active isolated UE movements out of spastic pattern     LLE Tone   LLE Tone Moderate                      Objective measurements completed on examination: See above findings.               PT Education - 07/27/20 1359    Education Details Updated HEP with emphasis on proximal strengthening and mobility, balance. [email or web address]    Person(s) Educated Patient    Methods Explanation;Demonstration;Handout    Comprehension Verbalized understanding;Returned demonstration            PT Short Term Goals - 07/27/20 1411      PT SHORT TERM GOAL #1   Title Pt will be independent with initial HEP    Time 2    Status New    Target Date 08/10/20             PT Long Term Goals - 07/27/20 1412      PT LONG TERM GOAL #1   Title Pt will complete 5xSTS in </= 12 seconds with less compensations and knee valgus to demonstrate improved functional LE strength and balance    Time 6    Period  Weeks    Status New    Target Date 09/14/20      PT LONG TERM GOAL #2   Title Pt will be able to stand with sustained upright posture for at least 20 minutes with minimal c/o pain or fatigue to facilitate safe standing during ADLs at home    Time 6    Period Weeks    Status New    Target Date 09/14/20      PT LONG TERM GOAL #3   Title BLE to at least 4+/5    Baseline mild weakness on the RLE, singificant weakness along LLE    Time 6    Period Weeks    Status New    Target Date 09/14/20      PT LONG TERM GOAL #4   Title pain <3/10 upon getting out of bed in the morning    Time 6    Period Weeks    Status New    Target Date 09/14/20      PT LONG TERM GOAL #5   Title Pt will demo improved berg balance score to at least 43/56 to demonstrate decreased falls risk    Baseline 07/27/2020: 33/56    Time 6  Period Weeks    Status New      PT LONG TERM GOAL #6   Title Pt will ambulate with LRAD or I with minimal LOB or stumbling to facilitate safe negotiation of home and community environments.    Time 6    Period Weeks    Status New    Target Date 09/14/20                  Plan - 07/27/20 1343    Clinical Impression Statement Pt returns to this office after about 2 months for primary c/o low back pain and fatigue, balance issues. Pt had a stroke in 2006 with left side involvement, spasticity. Pt presents with limited LLE AROM, general ms weakness, decreased ms endurance, limited active isolated left UE/LE movement against spastic pattern, decreased balance, abnormal gait and posture. As a result, pt reports over past few months he has noticed he has about an hour in the morning before slouching and getting alot of fatigue with pain into his low back, having difficulty keeping up with ADLs, and has difficulty negotiating the home and community environments safely. He scores 23" with 5TSTS, 18" with Timed up and go, and 33/56 on the Berg indicating high falls risk at this time.  Danny Hopkins would benefit from skilled physical therapy to decrease pain as well as  improve overall functional strength, postural endurance, balance, and decrease risk of falls.    Personal Factors and Comorbidities Comorbidity 3+;Fitness;Time since onset of injury/illness/exacerbation    Examination-Activity Limitations Bend;Locomotion Level;Reach Overhead;Transfers;Carry;Dressing;Hygiene/Grooming;Lift;Toileting;Stand;Stairs;Squat    Examination-Participation Restrictions Cleaning;Occupation;Community Activity;Laundry;Shop    Stability/Clinical Decision Making Evolving/Moderate complexity    Clinical Decision Making Moderate    Rehab Potential Good    PT Frequency 1x / week    PT Duration 6 weeks    PT Treatment/Interventions Iontophoresis 4mg /ml Dexamethasone;Moist Heat;Cryotherapy;Traction;Electrical Stimulation;Gait training;Stair training;Functional mobility training;Therapeutic activities;Therapeutic exercise;Balance training;Neuromuscular re-education;Manual techniques;Patient/family education;Taping;Dry needling;ADLs/Self Care Home Management    PT Next Visit Plan Low back FOTO next visit. Can reassess HEP. Tx focus to include proximal strength - core, hip ext/abd, endurance  and balance as tolerated. High intensity gait training for gait mechanics, endurance and stability as tolerated. Lumbar spine ROM and stabilization. He may bring in cane from home - may benefit from gait trianing with this for safe use and improved stability.   Modalities and manual as needed.    PT Home Exercise Plan [email or web address]    Consulted and Agree with Plan of Care Patient           Patient will benefit from skilled therapeutic intervention in order to improve the following deficits and impairments:  Abnormal gait,Decreased coordination,Decreased range of motion,Difficulty walking,Increased fascial restricitons,Impaired tone,Impaired UE functional use,Increased muscle spasms,Decreased endurance,Decreased activity  tolerance,Pain,Improper body mechanics,Impaired flexibility,Decreased balance,Decreased mobility,Decreased strength,Postural dysfunction  Visit Diagnosis: Muscle weakness (generalized) - Plan: PT plan of care cert/re-cert  Abnormal posture - Plan: PT plan of care cert/re-cert  Chronic low back pain without sciatica, unspecified back pain laterality - Plan: PT plan of care cert/re-cert  Repeated falls - Plan: PT plan of care cert/re-cert  Unsteadiness on feet - Plan: PT plan of care cert/re-cert     Problem List Patient Active Problem List   Diagnosis Date Noted  . Dysphagia 06/10/2020  . Sore in nose 05/21/2020  . Weight loss 05/19/2020  . Left arm pain 04/19/2020  . Migraines 01/07/2020  . Depression 08/23/2019  . Spasticity 05/29/2019  . Vitamin D deficiency 08/25/2018  . Laceration  of left hand 09/27/2017  . Acute pharyngitis 07/14/2017  . Chronic tonsillitis 07/12/2017  . Chronic pain syndrome 06/22/2017  . Bradycardia 06/22/2017  . Abnormal ECG 06/22/2017  . Former smoker 06/21/2016  . Scalp pain 07/30/2015  . Muscle spasticity 06/24/2015  . Dandruff 06/24/2015  . Lymphadenitis 06/24/2015  . Burning with urination 01/25/2015  . Balanitis 01/25/2015  . Sleeping difficulties 02/04/2014  . Bladder neck obstruction 02/04/2014  . Hyperlipidemia 02/04/2014  . Anterior spinal artery compression syndrome 10/10/2013  . Other malaise and fatigue 07/24/2013  . Stroke ([name]) 07/24/2013  . Encounter for well adult exam with abnormal findings 07/24/2013  . 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 Displacement of lumbar intervertebral disc without myelopathy 05/29/2013  . Cervical spondylosis with myelopathy 01/30/2013  . Insomnia 10/12/2012  . Generalized anxiety disorder 10/12/2012  . Eczema 10/12/2012  . History of stroke 10/12/2012  . Corn or callus 07/07/2010  . ED (erectile dysfunction) of organic origin 07/07/2010  . Neurogenic bladder 06/10/2010  . Unspecified  urinary incontinence 06/10/2010  . Hemiplegia ([place]) 06/09/2010    Hall Busing, PT, DPT 07/27/2020, 2:27 PM  [place] [place], [place], [address] Phone: [phone]   Fax:  [phone]  Name: Danny Hopkins MRN: [id number] Date of Birth: 1960/11/11

## 2020-07-27 NOTE — Telephone Encounter (Signed)
    Follow up message   Patient requesting return call to discuss home PT

## 2020-07-28 NOTE — Telephone Encounter (Signed)
Patient calling, requesting someone give him a call

## 2020-07-30 DIAGNOSIS — M4712 Other spondylosis with myelopathy, cervical region: Secondary | ICD-10-CM | POA: Diagnosis not present

## 2020-07-30 DIAGNOSIS — M5126 Other intervertebral disc displacement, lumbar region: Secondary | ICD-10-CM | POA: Diagnosis not present

## 2020-07-30 NOTE — Telephone Encounter (Signed)
Patient checking the status of home health PT. Advised patient that order has been placed for [facility] services by Dr. Jenny Reichmann and someone should be contacting him.

## 2020-07-31 NOTE — Telephone Encounter (Signed)
Patient calling, states when he talked to [facility] yesterday she woke him up from a nap and doesn't remember what they talked about, requesting that she calls him back

## 2020-08-05 ENCOUNTER — Ambulatory Visit: Payer: Medicare HMO | Admitting: Physical Therapy

## 2020-08-07 NOTE — Telephone Encounter (Signed)
Patient calling, requesting someone call him back regarding his home health care

## 2020-08-07 NOTE — Telephone Encounter (Signed)
[employer] calling, states the patient needs a prior auth for home health services, it can be submitted thru the website availity or thru the phone number [phone] If there is an yquestions call [phone]

## 2020-08-10 ENCOUNTER — Other Ambulatory Visit: Payer: Self-pay | Admitting: Internal Medicine

## 2020-08-10 NOTE — Telephone Encounter (Signed)
Please refill as per office routine med refill policy (all routine meds refilled for 3 mo or monthly per pt preference up to one year from last visit, then month to month grace period for 3 mo, then further med refills will have to be denied)  

## 2020-08-10 NOTE — Telephone Encounter (Signed)
Follow up message    Holland Falling calling to follow up on prior auth for home services Ref # [id number] Phone [phone]

## 2020-08-10 NOTE — Telephone Encounter (Signed)
Patient is requesting a refill of the following medications: Requested Prescriptions   Pending Prescriptions Disp Refills  . ALPRAZolam (XANAX) 1 MG tablet [Pharmacy Med Name: ALPRAZOLAM 1 MG TABLET] 75 tablet 2    Sig: TAKE 1 TABLET IN THE MORNING AND 1 AND 1/2 TABLETS IN THE EVENING AS NEEDED    Date of patient request: 08/10/20 Last office visit: 06/10/20   Date of last refill: 05/19/20 Last refill amount: 75,2    Follow up time period per chart: 11/16/20

## 2020-08-12 ENCOUNTER — Other Ambulatory Visit: Payer: Self-pay

## 2020-08-12 ENCOUNTER — Ambulatory Visit: Payer: Medicare HMO

## 2020-08-12 DIAGNOSIS — M545 Low back pain, unspecified: Secondary | ICD-10-CM | POA: Diagnosis not present

## 2020-08-12 DIAGNOSIS — R2681 Unsteadiness on feet: Secondary | ICD-10-CM | POA: Diagnosis not present

## 2020-08-12 DIAGNOSIS — R293 Abnormal posture: Secondary | ICD-10-CM | POA: Diagnosis not present

## 2020-08-12 DIAGNOSIS — M6281 Muscle weakness (generalized): Secondary | ICD-10-CM

## 2020-08-12 DIAGNOSIS — R296 Repeated falls: Secondary | ICD-10-CM | POA: Diagnosis not present

## 2020-08-12 DIAGNOSIS — R2689 Other abnormalities of gait and mobility: Secondary | ICD-10-CM

## 2020-08-12 DIAGNOSIS — G8929 Other chronic pain: Secondary | ICD-10-CM | POA: Diagnosis not present

## 2020-08-12 DIAGNOSIS — M5126 Other intervertebral disc displacement, lumbar region: Secondary | ICD-10-CM | POA: Diagnosis not present

## 2020-08-12 NOTE — Therapy (Signed)
[place], [place], [address] Phone: [phone]   Fax:  [phone]  Physical Therapy Treatment  Patient Details  Name: Danny Hopkins MRN: [id number] Date of Birth: [birth year] Referring Provider (PT): Marikay Alar Date: 08/12/2020   PT End of Session - 08/12/20 7169    Visit Number 2    Number of Visits 13    Date for PT Re-Evaluation 09/14/20    PT Start Time 6789    PT Stop Time 1530    PT Time Calculation (min) 45 min    Activity Tolerance Patient tolerated treatment well    Behavior During Therapy [facility] for tasks assessed/performed           Past Medical History:  Diagnosis Date  . Anxiety    07/2019  . Arthritis   . Depression 08/23/2019  . History of chicken pox   . Hyperlipidemia    2015  . 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 Stroke [facility])    2006    Past Surgical History:  Procedure Laterality Date  . cervical staph infection     2006  . COLONOSCOPY     2015  . INGUINAL HERNIA REPAIR     kindergarten    There were no vitals filed for this visit.   Subjective Assessment - 08/12/20 1449    Subjective had MRI of lower back this morning. Back in alot of pain since he has been up and running around all day. Fell 1x since last PT appointment    Pertinent History Stroke 2006 with LUE/LLE involvement. Middle low back pain which began after stroke. He has spasticity all along his left side. 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 Yes    Pain Score 10-Worst pain ever                             [place] Adult PT Treatment/Exercise - 08/12/20 0001      Ambulation/Gait   Pre-Gait Activities adjusted SPC  height.      Exercises   Exercises Lumbar;Knee/Hip;Ankle      Lumbar Exercises: Stretches   Double Knee to Chest Stretch --    Double Knee to Chest Stretch Limitations 2 x 10 with green pball - effortful    Lower Trunk Rotation Limitations x 10 B   green pball     Lumbar Exercises: Aerobic   Nustep L3 x 6 min      Lumbar Exercises: Seated   Sit to Stand Limitations 10 x 2 from high perch/elevated mat table. faciulitation for hip extension    Other Seated Lumbar Exercises seated rowwith red TB 10 x 2 . horizontal abd yellow x 10 B. red extension unilateral x 10 on right, yellow assisted x 8 on left weaker side seated forward flexion to upright x 15. Anterior WS practice x 15.  Resisted forward flexion to upright # 10 PT manual resistance.      Lumbar Exercises: Supine   Bridge Limitations 10x2 legs on green pball    Other Supine Lumbar Exercises 10 x 2 clam with red TB. manually resisted leg press unilateral in supine  with PT arm resistance x 10 each leg.                    PT Short Term Goals - 07/27/20 1411      PT SHORT TERM GOAL #1   Title Pt will be independent with initial HEP    Time 2    Status New    Target Date 08/10/20             PT Long Term Goals - 07/27/20 1412      PT LONG TERM GOAL #1   Title Pt will complete 5xSTS in </= 12 seconds with less compensations and knee valgus to demonstrate improved functional LE strength and balance    Time 6    Period Weeks    Status New    Target Date 09/14/20      PT LONG TERM GOAL #2   Title Pt will be able to stand with sustained upright posture for at least 20 minutes with minimal c/o pain or fatigue to facilitate safe standing during ADLs at home    Time 6    Period Weeks    Status New    Target Date 09/14/20      PT LONG TERM GOAL #3   Title BLE to at least 4+/5    Baseline mild weakness on the RLE, singificant weakness along LLE    Time 6    Period Weeks    Status New    Target Date 09/14/20       PT LONG TERM GOAL #4   Title pain <3/10 upon getting out of bed in the morning    Time 6    Period Weeks    Status New    Target Date 09/14/20      PT LONG TERM GOAL #5   Title Pt will demo improved berg balance score to at least 43/56 to demonstrate decreased falls risk    Baseline 07/27/2020: 33/56    Time 6    Period Weeks    Status New      PT LONG TERM GOAL #6   Title Pt will ambulate with LRAD or I with minimal LOB or stumbling to facilitate safe negotiation of home and community environments.    Time 6    Period Weeks    Status New    Target Date 09/14/20                 Plan - 08/12/20 1530    Clinical Impression Statement Pt tolerated exercises nicely today. Continues to report back pain worsenign as day goes on and he is standing. No pain with supine exercises just very effortful d/t weakness. Needed mod cues and demo to improve completeness of anterior WS before getting into standing and with repetition demosntrated improved transition into stand with STS exercises. Educated on continued use of cane as needed to help decrease falls/provide a bit more support at this time.    Personal Factors and Comorbidities Comorbidity 3+;Fitness;Time since onset of injury/illness/exacerbation    Examination-Activity Limitations Bend;Locomotion Level;Reach Overhead;Transfers;Carry;Dressing;Hygiene/Grooming;Lift;Toileting;Stand;Stairs;Squat    Examination-Participation Restrictions Cleaning;Occupation;Community Activity;Laundry;Shop    Stability/Clinical Decision Making Evolving/Moderate complexity    Rehab Potential Good    PT Frequency 1x / week    PT Duration 6 weeks    PT Treatment/Interventions Iontophoresis 4mg /ml Dexamethasone;Moist Heat;Cryotherapy;Traction;Electrical Stimulation;Gait training;Stair training;Functional mobility training;Therapeutic activities;Therapeutic exercise;Balance training;Neuromuscular re-education;Manual techniques;Patient/family  education;Taping;Dry needling;ADLs/Self Care Home Management    PT Next Visit Plan Low  back FOTO next visit. Can reassess HEP. Tx focus to include proximal strength - core, hip ext/abd, endurance  and balance as tolerated. High intensity gait training for gait mechanics, endurance and stability as tolerated. Lumbar spine ROM and stabilization. He may bring in cane from home - may benefit from gait trianing with this for safe use and improved stability.   Modalities and manual as needed.    PT Home Exercise Plan [id number]    Consulted and Agree with Plan of Care Patient           Patient will benefit from skilled therapeutic intervention in order to improve the following deficits and impairments:  Abnormal gait,Decreased coordination,Decreased range of motion,Difficulty walking,Increased fascial restricitons,Impaired tone,Impaired UE functional use,Increased muscle spasms,Decreased endurance,Decreased activity tolerance,Pain,Improper body mechanics,Impaired flexibility,Decreased balance,Decreased mobility,Decreased strength,Postural dysfunction  Visit Diagnosis: Muscle weakness (generalized)  Chronic low back pain without sciatica, unspecified back pain laterality  Abnormal posture  Repeated falls  Unsteadiness on feet  Other abnormalities of gait and mobility     Problem List Patient Active Problem List   Diagnosis Date Noted  . Dysphagia 06/10/2020  . Sore in nose 05/21/2020  . Weight loss 05/19/2020  . Left arm pain 04/19/2020  . Migraines 01/07/2020  . Depression 08/23/2019  . Spasticity 05/29/2019  . Vitamin D deficiency 08/25/2018  . Laceration of left hand 09/27/2017  . Acute pharyngitis 07/14/2017  . Chronic tonsillitis 07/12/2017  . Chronic pain syndrome 06/22/2017  . Bradycardia 06/22/2017  . Abnormal ECG 06/22/2017  . Former smoker 06/21/2016  . Scalp pain 07/30/2015  . Muscle spasticity 06/24/2015  . Dandruff 06/24/2015  . Lymphadenitis 06/24/2015  . Burning  with urination 01/25/2015  . Balanitis 01/25/2015  . Sleeping difficulties 02/04/2014  . Bladder neck obstruction 02/04/2014  . Hyperlipidemia 02/04/2014  . Anterior spinal artery compression syndrome 10/10/2013  . Other malaise and fatigue 07/24/2013  . Stroke ([place]) 07/24/2013  . Encounter for well adult exam with abnormal findings 07/24/2013  . 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 Displacement of lumbar intervertebral disc without myelopathy 05/29/2013  . Cervical spondylosis with myelopathy 01/30/2013  . Insomnia 10/12/2012  . Generalized anxiety disorder 10/12/2012  . Eczema 10/12/2012  . History of stroke 10/12/2012  . Corn or callus 07/07/2010  . ED (erectile dysfunction) of organic origin 07/07/2010  . Neurogenic bladder 06/10/2010  . Unspecified urinary incontinence 06/10/2010  . Hemiplegia ([place]) 06/09/2010    Hall Busing , PT, DPT 08/12/2020, 3:33 PM  [place] [place], [place], [address] Phone: [phone]   Fax:  [phone]  Name: Danny Hopkins MRN: [id number] Date of Birth: 09-30-1960

## 2020-08-19 ENCOUNTER — Encounter: Payer: Self-pay | Admitting: Internal Medicine

## 2020-08-19 ENCOUNTER — Ambulatory Visit: Payer: Medicare HMO

## 2020-08-19 ENCOUNTER — Ambulatory Visit (INDEPENDENT_AMBULATORY_CARE_PROVIDER_SITE_OTHER): Payer: Medicare HMO | Admitting: Internal Medicine

## 2020-08-19 ENCOUNTER — Other Ambulatory Visit: Payer: Self-pay

## 2020-08-19 VITALS — BP 120/70 | HR 59 | Temp 97.8°F | Ht 67.0 in | Wt 131.0 lb

## 2020-08-19 DIAGNOSIS — H6981 Other specified disorders of Eustachian tube, right ear: Secondary | ICD-10-CM | POA: Diagnosis not present

## 2020-08-19 DIAGNOSIS — J309 Allergic rhinitis, unspecified: Secondary | ICD-10-CM

## 2020-08-19 DIAGNOSIS — F411 Generalized anxiety disorder: Secondary | ICD-10-CM | POA: Diagnosis not present

## 2020-08-19 DIAGNOSIS — R69 Illness, unspecified: Secondary | ICD-10-CM | POA: Diagnosis not present

## 2020-08-19 MED ORDER — TRIAMCINOLONE ACETONIDE 55 MCG/ACT NA AERO
2.0000 | INHALATION_SPRAY | Freq: Every day | NASAL | 12 refills | Status: DC
Start: 2020-08-19 — End: 2020-10-10

## 2020-08-19 MED ORDER — METHYLPREDNISOLONE ACETATE 80 MG/ML IJ SUSP
80.0000 mg | Freq: Once | INTRAMUSCULAR | Status: AC
Start: 2020-08-19 — End: 2020-08-19
  Administered 2020-08-19: 80 mg via INTRAMUSCULAR

## 2020-08-19 MED ORDER — FEXOFENADINE HCL 180 MG PO TABS: 180.0000 mg | ORAL_TABLET | Freq: Every day | ORAL | 5 refills | Status: DC

## 2020-08-19 MED ORDER — PREDNISONE 10 MG PO TABS: ORAL_TABLET | ORAL | 0 refills | Status: DC

## 2020-08-19 MED ORDER — GUAIFENESIN ER 600 MG PO TB12: 1200.0000 mg | ORAL_TABLET | Freq: Two times a day (BID) | ORAL | 1 refills | Status: DC | PRN

## 2020-08-19 NOTE — Patient Instructions (Addendum)
You had the steroid shot today  Please take all new medication as prescribed - the prednisone, allegra and nasacort  You can also take Mucinex (or it's generic off brand) for congestion, and tylenol as needed for pain.  Please go to the ED for any worsening blue or pain to the fingers  Please continue all other medications as before, and refills have been done if requested.  Please have the pharmacy call with any other refills you may need.  Please continue your efforts at being more active, low cholesterol diet, and weight control.  Please keep your appointments with your specialists as you may have planned

## 2020-08-19 NOTE — Progress Notes (Signed)
Patient ID: Danny Hopkins, male   DOB: [birth year], 60 y.o.   MRN: [id number]        Chief Complaint:  Right ear pain       HPI:  Danny Hopkins is a 60 y.o. male here with right ear fullness and muffled hearing without HA, fever, vertigo, but Does have several wks ongoing nasal allergy symptoms with clearish congestion, itch and sneezing, without fever, pain, ST, cough, swelling or wheezing.  Pt denies chest pain, increased sob or doe, wheezing, orthopnea, PND, increased LE swelling, palpitations, dizziness or syncope.   Pt denies polydipsia, polyuria, or new focal neuro s/s.  Denies worsening depressive symptoms, suicidal ideation, or panic       Wt Readings from Last 3 Encounters:  08/19/20 131 lb (59.4 kg)  07/14/20 136 lb 9.6 oz (62 kg)  06/10/20 134 lb (60.8 kg)   BP Readings from Last 3 Encounters:  08/19/20 120/70  07/14/20 104/64  06/10/20 120/78         Past Medical History:  Diagnosis Date  . Anxiety    07/2019  . Arthritis   . Depression 08/23/2019  . History of chicken pox   . Hyperlipidemia    2015  . 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 Stroke [facility])    2006   Past Surgical History:  Procedure Laterality Date  . cervical staph infection     2006  . COLONOSCOPY     2015  . INGUINAL HERNIA REPAIR     kindergarten    reports that he has never smoked. He has never used smokeless tobacco. He reports current alcohol use of about 2.0 standard drinks of alcohol per week. He reports previous drug use. Drug: Marijuana. family history is not on file. No Known Allergies Current Outpatient Medications on File Prior to Visit  Medication Sig Dispense Refill  . ALPRAZolam (XANAX) 1 MG tablet TAKE 1 TABLET IN THE MORNING AND 1 AND 1/2 TABLETS IN THE EVENING AS NEEDED 75 tablet 5  . atorvastatin (LIPITOR) 20 MG tablet TAKE 1 TABLET BY MOUTH EVERY DAY 90 tablet 3  . baclofen (LIORESAL) 10 MG tablet Take by mouth every 4 (four) hours.    .  clotrimazole-betamethasone (LOTRISONE) cream APPLY TO AFFECTED AREA TWICE A DAY 30 g 2  . HYDROcodone-acetaminophen (NORCO) 10-325 MG per tablet Take 1 tablet by mouth every 8 (eight) hours as needed.     . mupirocin nasal ointment (BACTROBAN) 2 % Place 1 application into the nose 2 (two) times daily. Use one-half of tube in each nostril twice daily for five (5) days. After application, press sides of nose together and gently massage. 10 g 0  . naproxen (NAPROSYN) 500 MG tablet TAKE 1 TABLET (500 MG TOTAL) BY MOUTH 2 (TWO) TIMES DAILY AS NEEDED FOR HEADACHE. 60 tablet 2  . omeprazole (PRILOSEC) 20 MG capsule Take 1 capsule (20 mg total) by mouth daily as needed. 30 capsule 11  . PARoxetine (PAXIL) 10 MG tablet Take 1 tablet (10 mg total) by mouth daily. 90 tablet 3  . SHINGRIX injection     . SUMAtriptan (IMITREX) 100 MG tablet Take 1 tablet (100 mg total) by mouth every 2 (two) hours as needed for migraine or headache. May repeat in 2 hours if headache persists or recurs. 10 tablet 11  . tamsulosin (FLOMAX) 0.4 MG CAPS capsule TAKE 1 CAPSULE BY MOUTH EVERY DAY 90 capsule 3   No current facility-administered medications on  file prior to visit.        ROS:  All others reviewed and negative.  Objective        PE:  BP 120/70 (BP Location: Left Arm, Patient Position: Sitting, Cuff Size: Normal)   Pulse (!) 59   Temp 97.8 F (36.6 C) (Oral)   Ht 5\' 7"  (1.702 m)   Wt 131 lb (59.4 kg)   SpO2 93%   BMI 20.52 kg/m                 Constitutional: Pt appears in NAD               HENT: Head: NCAT.                Right Ear: External ear normal.                 Left Ear: External ear normal.                Eyes: . Pupils are equal, round, and reactive to light. Conjunctivae and EOM are normal;    Bilat tm's with mild erythema.  Max sinus areas non tender.  Pharynx with mild erythema, no exudate               Nose: without d/c or deformity               Neck: Neck supple. Gross normal ROM                Cardiovascular: Normal rate and regular rhythm.                 Pulmonary/Chest: Effort normal and breath sounds without rales or wheezing.                               Neurological: Pt is alert. At baseline orientation, motor grossly intact               Skin:  LE edema - none               Psychiatric: Pt behavior is normal without agitation   Micro: none  Cardiac tracings I have personally interpreted today:  none  Pertinent Radiological findings (summarize): none   Lab Results  Component Value Date   WBC 5.8 05/19/2020   HGB 16.3 05/19/2020   HCT 48.3 05/19/2020   PLT 169.0 05/19/2020   GLUCOSE 67 (L) 05/19/2020   CHOL 136 05/19/2020   TRIG 89.0 05/19/2020   HDL 48.90 05/19/2020   LDLCALC 69 05/19/2020   ALT 14 05/19/2020   AST 14 05/19/2020   NA 140 05/19/2020   K 4.3 05/19/2020   CL 100 05/19/2020   CREATININE 0.98 05/19/2020   BUN 22 05/19/2020   CO2 34 (H) 05/19/2020   TSH 1.17 05/19/2020   PSA 0.37 05/19/2020   Assessment/Plan:  Danny Hopkins is a 60 y.o. Black or African American [2] male with  has a past medical history of Anxiety, Arthritis, Depression (08/23/2019), History of chicken pox, Hyperlipidemia, Neurologic gait disorder (07/24/2013), and Stroke [facility]).  Generalized anxiety disorder Stable, to continue current med tx - paxil   Allergic rhinitis Mild to mod, for depomedrol im 80, predpac asd, allegra and nasacort asd, to f/u any worsening symptoms or concerns  Acute dysfunction of right eustachian tube Ok for mucinex otc prn  Followup: Return if symptoms worsen or fail to improve.  Cathlean Cower,  MD 08/23/2020 6:58 PM [facility]

## 2020-08-20 DIAGNOSIS — E785 Hyperlipidemia, unspecified: Secondary | ICD-10-CM | POA: Diagnosis not present

## 2020-08-20 DIAGNOSIS — I7300 Raynaud's syndrome without gangrene: Secondary | ICD-10-CM | POA: Diagnosis not present

## 2020-08-20 DIAGNOSIS — I69354 Hemiplegia and hemiparesis following cerebral infarction affecting left non-dominant side: Secondary | ICD-10-CM | POA: Diagnosis not present

## 2020-08-20 DIAGNOSIS — N400 Enlarged prostate without lower urinary tract symptoms: Secondary | ICD-10-CM | POA: Diagnosis not present

## 2020-08-20 DIAGNOSIS — M5126 Other intervertebral disc displacement, lumbar region: Secondary | ICD-10-CM | POA: Diagnosis not present

## 2020-08-20 DIAGNOSIS — R69 Illness, unspecified: Secondary | ICD-10-CM | POA: Diagnosis not present

## 2020-08-21 DIAGNOSIS — I7300 Raynaud's syndrome without gangrene: Secondary | ICD-10-CM | POA: Diagnosis not present

## 2020-08-21 DIAGNOSIS — E785 Hyperlipidemia, unspecified: Secondary | ICD-10-CM | POA: Diagnosis not present

## 2020-08-21 NOTE — Telephone Encounter (Addendum)
Spoke with [name] from [facility] and she states that prior [name] is not needed for home services. Reference #[id number]. Patient notified.

## 2020-08-23 ENCOUNTER — Encounter: Payer: Self-pay | Admitting: Internal Medicine

## 2020-08-23 DIAGNOSIS — H6981 Other specified disorders of Eustachian tube, right ear: Secondary | ICD-10-CM | POA: Insufficient documentation

## 2020-08-23 DIAGNOSIS — J309 Allergic rhinitis, unspecified: Secondary | ICD-10-CM | POA: Insufficient documentation

## 2020-08-23 NOTE — Assessment & Plan Note (Signed)
Mild to mod, for depomedrol im 80, predpac asd, allegra and nasacort asd, to f/u any worsening symptoms or concerns

## 2020-08-23 NOTE — Assessment & Plan Note (Signed)
Ok for [employer] prn

## 2020-08-23 NOTE — Assessment & Plan Note (Signed)
Stable, to continue current med tx - paxil

## 2020-08-25 ENCOUNTER — Telehealth: Payer: Self-pay | Admitting: Internal Medicine

## 2020-08-25 ENCOUNTER — Ambulatory Visit: Payer: Medicare Other | Admitting: Internal Medicine

## 2020-08-25 NOTE — Telephone Encounter (Signed)
[place] with [facility] called and said that the patient filled a complaint because he has not received home health services. Dr. Jenny Reichmann referred pt to [facility] on 4/27. Lovey Newcomer is requesting a call back. Please advise  Phone: [phone]

## 2020-08-26 ENCOUNTER — Ambulatory Visit: Payer: Medicare HMO | Attending: Family Medicine

## 2020-08-27 ENCOUNTER — Other Ambulatory Visit: Payer: Self-pay | Admitting: Internal Medicine

## 2020-08-27 ENCOUNTER — Other Ambulatory Visit: Payer: Self-pay

## 2020-08-27 DIAGNOSIS — R269 Unspecified abnormalities of gait and mobility: Secondary | ICD-10-CM

## 2020-08-27 DIAGNOSIS — Z8673 Personal history of transient ischemic attack (TIA), and cerebral infarction without residual deficits: Secondary | ICD-10-CM

## 2020-08-27 DIAGNOSIS — R252 Cramp and spasm: Secondary | ICD-10-CM

## 2020-09-01 ENCOUNTER — Telehealth: Payer: Self-pay | Admitting: Internal Medicine

## 2020-09-01 DIAGNOSIS — Z87891 Personal history of nicotine dependence: Secondary | ICD-10-CM | POA: Diagnosis not present

## 2020-09-01 DIAGNOSIS — Z9181 History of falling: Secondary | ICD-10-CM | POA: Diagnosis not present

## 2020-09-01 DIAGNOSIS — R001 Bradycardia, unspecified: Secondary | ICD-10-CM | POA: Diagnosis not present

## 2020-09-01 DIAGNOSIS — R269 Unspecified abnormalities of gait and mobility: Secondary | ICD-10-CM | POA: Diagnosis not present

## 2020-09-01 DIAGNOSIS — R69 Illness, unspecified: Secondary | ICD-10-CM | POA: Diagnosis not present

## 2020-09-01 DIAGNOSIS — I69398 Other sequelae of cerebral infarction: Secondary | ICD-10-CM | POA: Diagnosis not present

## 2020-09-01 NOTE — Telephone Encounter (Signed)
Ok for verbals 

## 2020-09-01 NOTE — Telephone Encounter (Signed)
   [place] Name: [place] Agency Name: [facility] Phone #: [phone] Service Requested: PT and SW eval Frequency of Visits: 2W6, [phone]

## 2020-09-02 NOTE — Telephone Encounter (Signed)
Patient called and is requesting a call back in regards to his home health care. He can be reached at [phone]

## 2020-09-02 NOTE — Telephone Encounter (Signed)
The patient has waffeled more than once on what services he wants  Ok to refer this Danny Hopkins

## 2020-09-02 NOTE — Telephone Encounter (Signed)
Tried Stage manager from [place]. No answer, unable to leave message.

## 2020-09-03 ENCOUNTER — Telehealth: Payer: Self-pay | Admitting: Internal Medicine

## 2020-09-03 DIAGNOSIS — R001 Bradycardia, unspecified: Secondary | ICD-10-CM | POA: Diagnosis not present

## 2020-09-03 DIAGNOSIS — Z9181 History of falling: Secondary | ICD-10-CM | POA: Diagnosis not present

## 2020-09-03 DIAGNOSIS — R69 Illness, unspecified: Secondary | ICD-10-CM | POA: Diagnosis not present

## 2020-09-03 DIAGNOSIS — R269 Unspecified abnormalities of gait and mobility: Secondary | ICD-10-CM | POA: Diagnosis not present

## 2020-09-03 DIAGNOSIS — Z87891 Personal history of nicotine dependence: Secondary | ICD-10-CM | POA: Diagnosis not present

## 2020-09-03 DIAGNOSIS — I69398 Other sequelae of cerebral infarction: Secondary | ICD-10-CM | POA: Diagnosis not present

## 2020-09-03 NOTE — Telephone Encounter (Signed)
Ok for verbals 

## 2020-09-03 NOTE — Telephone Encounter (Signed)
[place] Name: [facility] Agency Name: [facility] Phone #: [phone] Service Requested: PT (examples: OT/PT/Skilled Nursing/Social Work/Speech Therapy/Wound Care) Frequency of Visits: change to 2w3 and 1w4

## 2020-09-03 NOTE — Telephone Encounter (Signed)
[place] Name: [facility] Agency Name: [facility] Phone #: [phone] Service Requested: OT  (examples: OT/PT/Skilled Nursing/Social Work/Speech Therapy/Wound Care) Frequency of Visits: 1w3

## 2020-09-04 NOTE — Telephone Encounter (Signed)
Verbals given to [employer].  Pt notified that Dr. Jenny Reichmann will not be doing any more changes.

## 2020-09-04 NOTE — Telephone Encounter (Signed)
Verbal orders given to [facility].

## 2020-09-04 NOTE — Telephone Encounter (Signed)
[place] for verbal  Please let pt know we will not be able to change any HH orders from now on after this

## 2020-09-08 ENCOUNTER — Telehealth: Payer: Self-pay

## 2020-09-08 DIAGNOSIS — M1811 Unilateral primary osteoarthritis of first carpometacarpal joint, right hand: Secondary | ICD-10-CM | POA: Diagnosis not present

## 2020-09-08 DIAGNOSIS — I998 Other disorder of circulatory system: Secondary | ICD-10-CM | POA: Diagnosis not present

## 2020-09-08 DIAGNOSIS — M79644 Pain in right finger(s): Secondary | ICD-10-CM | POA: Diagnosis not present

## 2020-09-08 NOTE — Telephone Encounter (Signed)
Ok for verbal if that is ok 

## 2020-09-09 DIAGNOSIS — R001 Bradycardia, unspecified: Secondary | ICD-10-CM | POA: Diagnosis not present

## 2020-09-09 DIAGNOSIS — Z9181 History of falling: Secondary | ICD-10-CM | POA: Diagnosis not present

## 2020-09-09 DIAGNOSIS — R269 Unspecified abnormalities of gait and mobility: Secondary | ICD-10-CM | POA: Diagnosis not present

## 2020-09-09 DIAGNOSIS — I69398 Other sequelae of cerebral infarction: Secondary | ICD-10-CM | POA: Diagnosis not present

## 2020-09-09 DIAGNOSIS — Z87891 Personal history of nicotine dependence: Secondary | ICD-10-CM | POA: Diagnosis not present

## 2020-09-09 DIAGNOSIS — R69 Illness, unspecified: Secondary | ICD-10-CM | POA: Diagnosis not present

## 2020-09-10 DIAGNOSIS — Z87891 Personal history of nicotine dependence: Secondary | ICD-10-CM | POA: Diagnosis not present

## 2020-09-10 DIAGNOSIS — R69 Illness, unspecified: Secondary | ICD-10-CM | POA: Diagnosis not present

## 2020-09-10 DIAGNOSIS — I69398 Other sequelae of cerebral infarction: Secondary | ICD-10-CM | POA: Diagnosis not present

## 2020-09-10 DIAGNOSIS — Z9181 History of falling: Secondary | ICD-10-CM | POA: Diagnosis not present

## 2020-09-10 DIAGNOSIS — R269 Unspecified abnormalities of gait and mobility: Secondary | ICD-10-CM | POA: Diagnosis not present

## 2020-09-10 DIAGNOSIS — R001 Bradycardia, unspecified: Secondary | ICD-10-CM | POA: Diagnosis not present

## 2020-09-11 DIAGNOSIS — R69 Illness, unspecified: Secondary | ICD-10-CM | POA: Diagnosis not present

## 2020-09-11 DIAGNOSIS — R269 Unspecified abnormalities of gait and mobility: Secondary | ICD-10-CM | POA: Diagnosis not present

## 2020-09-11 DIAGNOSIS — R001 Bradycardia, unspecified: Secondary | ICD-10-CM | POA: Diagnosis not present

## 2020-09-11 DIAGNOSIS — Z9181 History of falling: Secondary | ICD-10-CM | POA: Diagnosis not present

## 2020-09-11 DIAGNOSIS — I69398 Other sequelae of cerebral infarction: Secondary | ICD-10-CM | POA: Diagnosis not present

## 2020-09-11 DIAGNOSIS — Z87891 Personal history of nicotine dependence: Secondary | ICD-10-CM | POA: Diagnosis not present

## 2020-09-15 DIAGNOSIS — Z9181 History of falling: Secondary | ICD-10-CM | POA: Diagnosis not present

## 2020-09-15 DIAGNOSIS — I69398 Other sequelae of cerebral infarction: Secondary | ICD-10-CM | POA: Diagnosis not present

## 2020-09-15 DIAGNOSIS — R001 Bradycardia, unspecified: Secondary | ICD-10-CM | POA: Diagnosis not present

## 2020-09-15 DIAGNOSIS — R69 Illness, unspecified: Secondary | ICD-10-CM | POA: Diagnosis not present

## 2020-09-15 DIAGNOSIS — Z87891 Personal history of nicotine dependence: Secondary | ICD-10-CM | POA: Diagnosis not present

## 2020-09-15 DIAGNOSIS — R269 Unspecified abnormalities of gait and mobility: Secondary | ICD-10-CM | POA: Diagnosis not present

## 2020-09-16 DIAGNOSIS — Z9181 History of falling: Secondary | ICD-10-CM | POA: Diagnosis not present

## 2020-09-16 DIAGNOSIS — I69398 Other sequelae of cerebral infarction: Secondary | ICD-10-CM | POA: Diagnosis not present

## 2020-09-16 DIAGNOSIS — Z87891 Personal history of nicotine dependence: Secondary | ICD-10-CM | POA: Diagnosis not present

## 2020-09-16 DIAGNOSIS — R69 Illness, unspecified: Secondary | ICD-10-CM | POA: Diagnosis not present

## 2020-09-16 DIAGNOSIS — R269 Unspecified abnormalities of gait and mobility: Secondary | ICD-10-CM | POA: Diagnosis not present

## 2020-09-16 DIAGNOSIS — R001 Bradycardia, unspecified: Secondary | ICD-10-CM | POA: Diagnosis not present

## 2020-09-17 DIAGNOSIS — M5416 Radiculopathy, lumbar region: Secondary | ICD-10-CM | POA: Diagnosis not present

## 2020-09-18 DIAGNOSIS — Z9181 History of falling: Secondary | ICD-10-CM | POA: Diagnosis not present

## 2020-09-18 DIAGNOSIS — R269 Unspecified abnormalities of gait and mobility: Secondary | ICD-10-CM | POA: Diagnosis not present

## 2020-09-18 DIAGNOSIS — I69398 Other sequelae of cerebral infarction: Secondary | ICD-10-CM | POA: Diagnosis not present

## 2020-09-18 DIAGNOSIS — Z87891 Personal history of nicotine dependence: Secondary | ICD-10-CM | POA: Diagnosis not present

## 2020-09-18 DIAGNOSIS — B079 Viral wart, unspecified: Secondary | ICD-10-CM | POA: Diagnosis not present

## 2020-09-18 DIAGNOSIS — R69 Illness, unspecified: Secondary | ICD-10-CM | POA: Diagnosis not present

## 2020-09-18 DIAGNOSIS — R001 Bradycardia, unspecified: Secondary | ICD-10-CM | POA: Diagnosis not present

## 2020-09-22 ENCOUNTER — Other Ambulatory Visit: Payer: Self-pay | Admitting: Orthopedic Surgery

## 2020-09-22 DIAGNOSIS — L98499 Non-pressure chronic ulcer of skin of other sites with unspecified severity: Secondary | ICD-10-CM

## 2020-09-23 DIAGNOSIS — Z87891 Personal history of nicotine dependence: Secondary | ICD-10-CM | POA: Diagnosis not present

## 2020-09-23 DIAGNOSIS — R269 Unspecified abnormalities of gait and mobility: Secondary | ICD-10-CM | POA: Diagnosis not present

## 2020-09-23 DIAGNOSIS — I69398 Other sequelae of cerebral infarction: Secondary | ICD-10-CM | POA: Diagnosis not present

## 2020-09-23 DIAGNOSIS — R69 Illness, unspecified: Secondary | ICD-10-CM | POA: Diagnosis not present

## 2020-09-23 DIAGNOSIS — R001 Bradycardia, unspecified: Secondary | ICD-10-CM | POA: Diagnosis not present

## 2020-09-23 DIAGNOSIS — Z9181 History of falling: Secondary | ICD-10-CM | POA: Diagnosis not present

## 2020-09-23 NOTE — Telephone Encounter (Signed)
error 

## 2020-09-24 ENCOUNTER — Other Ambulatory Visit: Payer: Self-pay | Admitting: Interventional Radiology

## 2020-09-24 ENCOUNTER — Ambulatory Visit
Admission: RE | Admit: 2020-09-24 | Discharge: 2020-09-24 | Disposition: A | Discharge status: Home / Self Care | Payer: Medicare HMO | Source: Ambulatory Visit | Attending: Orthopedic Surgery | Admitting: Orthopedic Surgery

## 2020-09-24 ENCOUNTER — Other Ambulatory Visit: Payer: Self-pay

## 2020-09-24 ENCOUNTER — Encounter: Payer: Self-pay | Admitting: *Deleted

## 2020-09-24 DIAGNOSIS — L98493 Non-pressure chronic ulcer of skin of other sites with necrosis of muscle: Secondary | ICD-10-CM

## 2020-09-24 DIAGNOSIS — L98499 Non-pressure chronic ulcer of skin of other sites with unspecified severity: Secondary | ICD-10-CM | POA: Diagnosis not present

## 2020-09-24 DIAGNOSIS — I2510 Atherosclerotic heart disease of native coronary artery without angina pectoris: Secondary | ICD-10-CM

## 2020-09-24 HISTORY — PX: IR RADIOLOGIST EVAL & MGMT: IMG5224

## 2020-09-24 NOTE — Consult Note (Addendum)
Chief Complaint: Right 4th finger ulcer  Referring Physician(s): Weingold,Matthew  History of Present Illness: Danny Hopkins is a 60 y.o. male presenting today to [place] [facility] for right fourth finger evaluation, kindly referred by Dr. Burney Gauze.   Danny Hopkins is here by himself for interview.   He tells me that he noticed the wound on the tip of his right fourth finger about 1 month ago.  He noticed some discoloration/blue discoloration of both the right 4th and 5th digits at the time.  He denies any significant pain in any of the digits.  He only has pain if he "bangs it."    He denies any additional symptoms of rash or other skin spots on his body.  He denies any lesions on his ears, trunk, or legs.  He reports that he had a left great toe lesion such as this at the time of his prior stroke (he estimates 2006) which healed without any special care.  He tells me he knows of no other skin lesion in his life.    He denies any symptoms of abdominal pain with BRBPR/melena that would suggest inflammatory bowel disease.  No history of this.  He denies any myositis/muscle pain.  He has ongoing back pain of his lower back, and he sees NSG for this.  He had cervical surgery by Dr. Annette Stable January 2007 for epidural abscess.    He tells me that he has only smoked "about 1 pack of cigarettes in my whole life."  He does endorse marijuana use.  It sounds like he started this when he was in high school, the frequency increased to about 10-20 marijuana cigarettes daily up until he entered the [employer].  Then he stopped. Then he started again after the [employer].  He tells me that he stopped smoking marijuana 1 yr ago.  He denies any marijuana in the past year. He endorses cocaine use in the past, but tells me that he quit at the time of his prior stroke, and has not used any recently.    He has had the prior stroke, with residual weakness of his left arm and leg.  He uses a cane most of the time,  but can walk around independently.    He denies any prior MI.  Denies any ongoing chest pain.   Denies any lower extremity claudication symptoms, though does not ambulate long distances.    He is right hand dominant.    He tells me his is never married.  No children.  He is from the area and has friends and relatives.  He has 2 brothers and 2 sisters.  No knowledge of any vascular diseases or skin disorders.    Not currently taking aspirin.       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  Medications: Prior to Admission medications   Medication Sig Start Date End Date  Taking? Authorizing Provider  ALPRAZolam Duanne Moron) 1 MG tablet TAKE 1 TABLET IN THE MORNING AND 1 AND 1/2 TABLETS IN THE EVENING AS NEEDED 08/10/20   Biagio Borg, MD  atorvastatin (LIPITOR) 20 MG tablet TAKE 1 TABLET BY MOUTH EVERY DAY 07/16/20   Biagio Borg, MD  baclofen (LIORESAL) 10 MG tablet Take by mouth every 4 (four) hours. 10/06/19   [provider]  clotrimazole-betamethasone (LOTRISONE) cream APPLY TO AFFECTED AREA TWICE A DAY 01/13/20   Biagio Borg, MD  fexofenadine (ALLEGRA) 180 MG tablet Take 1 tablet (180 mg total) by mouth daily. 08/19/20 08/19/21  Biagio Borg, MD  guaiFENesin (MUCINEX) 600 MG 12 hr tablet Take 2 tablets (1,200 mg total) by mouth 2 (two) times daily as needed. 08/19/20   Biagio Borg, MD  HYDROcodone-acetaminophen [facility]) 10-325 MG per tablet Take 1 tablet by mouth every 8 (eight) hours as needed.  09/24/12   [provider]   mupirocin nasal ointment (BACTROBAN) 2 % Place 1 application into the nose 2 (two) times daily. Use one-half of tube in each nostril twice daily for five (5) days. After application, press sides of nose together and gently massage. 05/19/20   Biagio Borg, MD  naproxen (NAPROSYN) 500 MG tablet TAKE 1 TABLET (500 MG TOTAL) BY MOUTH 2 (TWO) TIMES DAILY AS NEEDED FOR HEADACHE. 05/26/20   Biagio Borg, MD  omeprazole (PRILOSEC) 20 MG capsule Take 1 capsule (20 mg total) by mouth daily as needed. 01/07/20 01/06/21  Biagio Borg, MD  PARoxetine (PAXIL) 10 MG tablet Take 1 tablet (10 mg total) by mouth daily. 02/11/20   Biagio Borg, MD  predniSONE (DELTASONE) 10 MG tablet 3 tabs by mouth per day for 3 days,2tabs per day for 3 days,1tab per day for 3 days 08/19/20   Biagio Borg, MD  [facility] injection  04/21/19   [provider]  SUMAtriptan (IMITREX) 100 MG tablet Take 1 tablet (100 mg total) by mouth every 2 (two) hours as needed for migraine or headache. May repeat in 2 hours if headache persists or recurs. 01/07/20   Biagio Borg, MD  tamsulosin (FLOMAX) 0.4 MG CAPS capsule TAKE 1 CAPSULE BY MOUTH EVERY DAY 08/11/20   Biagio Borg, MD  triamcinolone (NASACORT) 55 MCG/ACT AERO nasal inhaler Place 2 sprays into the nose daily. 08/19/20   Biagio Borg, MD     Family History  Problem Relation Age of Onset   Stomach cancer Neg Hx    Rectal cancer Neg Hx    Pancreatic cancer Neg Hx    Colon cancer Neg Hx    Colon polyps Neg Hx    Esophageal cancer Neg Hx     Social History   Socioeconomic History   Marital status: Single    Spouse name: Not on file   Number of children: Not on file   Years of education: 12   Highest education level: Not on file  Occupational History   Occupation: DISABILITY    Employer: UNEMPLOYED  Tobacco Use   Smoking status: Never   Smokeless tobacco: Never  Vaping Use   Vaping Use: Never used  Substance and Sexual Activity   Alcohol use: Yes     Alcohol/week: 2.0 standard drinks    Types: 2 Cans of beer per week    Comment: 2-3 beers per week   Drug use: Not Currently    Types: Marijuana   Sexual activity: Not Currently  Other Topics Concern   Not on file  Social History Narrative   Regular exercise-no   Caffeine Use-no         Social Determinants of Health   Financial Resource Strain: Not on file  Food Insecurity: Not on file  Transportation Needs: Not on file  Physical Activity: Not on file  Stress: Not on file  Social Connections: Not on file      Review of Systems: A 12 point ROS discussed and pertinent positives are indicated in the HPI above.  All other systems are negative.  Review of Systems  Vital Signs: BP 117/64 (BP Location: Left Arm)   Pulse 97   SpO2 100%   Physical Exam General: 60 yo male appearing stated age.  Well-developed, well-nourished, lean.  No distress. HEENT: Atraumatic, normocephalic.  Conjugate gaze, extra-ocular motor intact. No scleral icterus or scleral injection. No lesions on external ears, nose, lips, or gums.  Specifically no discoloration of his ear lobes/helices of the ears. Oral mucosa moist, pink.  Neck: Symmetric with no goiter enlargement.  Chest/Lungs:  Symmetric chest with inspiration/expiration.  No labored breathing.  Clear to auscultation with no wheezes, rhonchi, or rales.  Heart:  RRR, with no third heart sounds appreciated. No JVD appreciated.  Abdomen:  Soft, NT/ND, with + bowel sounds.   Genito-urinary: Deferred Neurologic: Alert & Oriented to person, place, and time.   Normal affect and insight.  Appropriate questions.   MSK: Asymmetric weakness of the left UE compared to the right, at the shoulder, elbow, wrist, hand intrinsics.  Asymmetric weakness of the LLL compared to the right, at the hip, knee, ankle.  Pulse Exam:  No bruit appreciated.     Palpable right and left radial pulse.  Non-palp right and left ulnar pulse. Doppler positive on right and left.   Doppler positive right AT & PT.  Palpable left AT & PT.  Extremities: Dry eschar of the tip of the right 4th digit, from just under the nailbed to the pad of the finger.  ~1cm square. No TTP.  No drainage.  No erythema. Cap refill is prompt in the right 1-3 digits.  Slowed in the 4th and fifth digit. No discoloration of the other right sided digits.  No discoloration of the left sided digits, with cap refill prompt.  No wound of the lower extremity. .  Capillary refill prompt.    Mallampati Score:     Imaging: IR Radiologist Eval & Mgmt  Result Date: 09/24/2020 Please refer to notes tab for details about interventional procedure. (Op Note)   Labs:  CBC: Recent Labs    05/19/20 1443  WBC 5.8  HGB 16.3  HCT 48.3  PLT 169.0    COAGS: No results for input(s): INR, APTT in the last 8760 hours.  BMP: Recent Labs    05/19/20 1443  NA 140  K 4.3  CL 100  CO2 34*  GLUCOSE 67*  BUN 22  CALCIUM 9.6  CREATININE 0.98    LIVER FUNCTION TESTS: Recent Labs    05/19/20 1443  BILITOT 1.0  AST 14  ALT 14  ALKPHOS 74  PROT 6.6  ALBUMIN 4.2    TUMOR MARKERS: No results for input(s): AFPTM, CEA, CA199, CHROMGRNA in the last 8760 hours.  Assessment and Plan:  Danny Hopkins is very pleasant 60 yo male with new ulcer of the tip of the right 4th digit.    He has CV risk factors which include HLD and tobacco/marijuana exposure.  He has drug-induced vasculitis risk factors which include former marijuana exposure (quit 1 yr ago) and former cocaine exposure (quit >15 yrs ago).   He has no history of inflammatory bowel disease or other auto-immune disorder.    I did explain to him that the most common etiology of ulceration such as this is atherosclerotic disease, of which he has risk factors including his remote marijuana use and HLD.  Additionally, vasculitis can cause these ulcers in the upper extremity, and would be in the small vessel or small/medium vessel category,  including possibly drug-induced vasculitis.    For now, I think the most appropriate diagnostics would be to evaluate for arterial insufficiency with upper extremity non-invasive exam to include wrist/brachial index and segmental exam, as well as schedule for RUE angiogram to evaluate for any chronic changes such as atherosclerosis and/or thromboangiitis obliterans/buerger's, as well as possible source of atheroembolic plaque.    I did let him know that if we fail to diagnosis the etiology with our initial diagnostics, we might need to consider a formal rheumatology work up for other causes of vasculitis.  I assured him we can consider that later.    Given his CV risk factors and his presentation, I think it is also reasonable to start aspirin therapy.  I encouraged him to start 81mg  ASA per day.   Finally, because of his history of a prior left great toe wound, his CV risk factors, and his age, I think it is reasonable to perform screening non-invasive exam of his lower extremities.   Plan: - We will set him up for bilateral upper extremity non-invasive exam, with wrist brachial index and segmental exam.  - We will set him up for diagnostic right upper extremity angiogram with Dr. Earleen Newport at [facility] - We will set him up for lower extremity ABI/segmental exam given his CV risk factors, age, and prior toe wound.  - Initiate 81mg  aspirin per day  Thank you for this interesting consult.  I greatly enjoyed meeting Danny Hopkins and look forward to participating in their care.  A copy of this report was sent to the requesting provider on this date.  Electronically Signed: Corrie Mckusick 09/24/2020, 10:32 AM   I spent a total of  60 Minutes   in face to face in clinical consultation, greater than 50% of which was counseling/coordinating care for right 4th finger ulcer, possible diagnostic right upper extremity angiogram

## 2020-09-25 DIAGNOSIS — Z9181 History of falling: Secondary | ICD-10-CM | POA: Diagnosis not present

## 2020-09-25 DIAGNOSIS — R269 Unspecified abnormalities of gait and mobility: Secondary | ICD-10-CM | POA: Diagnosis not present

## 2020-09-25 DIAGNOSIS — R001 Bradycardia, unspecified: Secondary | ICD-10-CM | POA: Diagnosis not present

## 2020-09-25 DIAGNOSIS — I69398 Other sequelae of cerebral infarction: Secondary | ICD-10-CM | POA: Diagnosis not present

## 2020-09-25 DIAGNOSIS — R69 Illness, unspecified: Secondary | ICD-10-CM | POA: Diagnosis not present

## 2020-09-25 DIAGNOSIS — Z87891 Personal history of nicotine dependence: Secondary | ICD-10-CM | POA: Diagnosis not present

## 2020-09-29 DIAGNOSIS — L03011 Cellulitis of right finger: Secondary | ICD-10-CM | POA: Diagnosis not present

## 2020-09-30 ENCOUNTER — Ambulatory Visit
Admission: RE | Admit: 2020-09-30 | Discharge: 2020-09-30 | Disposition: A | Discharge status: Home / Self Care | Payer: Medicare HMO | Source: Ambulatory Visit | Attending: Interventional Radiology | Admitting: Interventional Radiology

## 2020-09-30 DIAGNOSIS — R001 Bradycardia, unspecified: Secondary | ICD-10-CM | POA: Diagnosis not present

## 2020-09-30 DIAGNOSIS — Z87891 Personal history of nicotine dependence: Secondary | ICD-10-CM | POA: Diagnosis not present

## 2020-09-30 DIAGNOSIS — Z9181 History of falling: Secondary | ICD-10-CM | POA: Diagnosis not present

## 2020-09-30 DIAGNOSIS — I69398 Other sequelae of cerebral infarction: Secondary | ICD-10-CM | POA: Diagnosis not present

## 2020-09-30 DIAGNOSIS — R269 Unspecified abnormalities of gait and mobility: Secondary | ICD-10-CM | POA: Diagnosis not present

## 2020-09-30 DIAGNOSIS — L98499 Non-pressure chronic ulcer of skin of other sites with unspecified severity: Secondary | ICD-10-CM | POA: Diagnosis not present

## 2020-09-30 DIAGNOSIS — R69 Illness, unspecified: Secondary | ICD-10-CM | POA: Diagnosis not present

## 2020-09-30 DIAGNOSIS — L98493 Non-pressure chronic ulcer of skin of other sites with necrosis of muscle: Secondary | ICD-10-CM

## 2020-10-05 ENCOUNTER — Other Ambulatory Visit: Payer: Self-pay

## 2020-10-05 ENCOUNTER — Ambulatory Visit
Admission: RE | Admit: 2020-10-05 | Discharge: 2020-10-05 | Disposition: A | Discharge status: Home / Self Care | Payer: Medicare HMO | Source: Ambulatory Visit | Attending: Interventional Radiology | Admitting: Interventional Radiology

## 2020-10-05 DIAGNOSIS — I2510 Atherosclerotic heart disease of native coronary artery without angina pectoris: Secondary | ICD-10-CM

## 2020-10-05 DIAGNOSIS — I998 Other disorder of circulatory system: Secondary | ICD-10-CM | POA: Diagnosis not present

## 2020-10-05 DIAGNOSIS — S91102A Unspecified open wound of left great toe without damage to nail, initial encounter: Secondary | ICD-10-CM | POA: Diagnosis not present

## 2020-10-06 ENCOUNTER — Other Ambulatory Visit (HOSPITAL_COMMUNITY): Payer: Self-pay | Admitting: Interventional Radiology

## 2020-10-06 DIAGNOSIS — R69 Illness, unspecified: Secondary | ICD-10-CM | POA: Diagnosis not present

## 2020-10-06 DIAGNOSIS — Z87891 Personal history of nicotine dependence: Secondary | ICD-10-CM | POA: Diagnosis not present

## 2020-10-06 DIAGNOSIS — R001 Bradycardia, unspecified: Secondary | ICD-10-CM | POA: Diagnosis not present

## 2020-10-06 DIAGNOSIS — Z9181 History of falling: Secondary | ICD-10-CM | POA: Diagnosis not present

## 2020-10-06 DIAGNOSIS — L98499 Non-pressure chronic ulcer of skin of other sites with unspecified severity: Secondary | ICD-10-CM

## 2020-10-06 DIAGNOSIS — R269 Unspecified abnormalities of gait and mobility: Secondary | ICD-10-CM | POA: Diagnosis not present

## 2020-10-06 DIAGNOSIS — I69398 Other sequelae of cerebral infarction: Secondary | ICD-10-CM | POA: Diagnosis not present

## 2020-10-07 ENCOUNTER — Telehealth: Payer: Self-pay | Admitting: Internal Medicine

## 2020-10-07 ENCOUNTER — Other Ambulatory Visit: Payer: Self-pay | Admitting: Radiology

## 2020-10-07 NOTE — Telephone Encounter (Signed)
Dr Verl Dicker from [place] called because she received a call from [facility] about patient  trying to get a [facility] referral for the same reason he asked you for one. Levada Dy said that she was told patient  has being seeing Harlan Stains at [facility] since Aug 20, 2020. She told [facility] that she had already explained to patient that insurance would not cover personal care. She just wanted to give you an FYI because patient may have transferred care.

## 2020-10-08 DIAGNOSIS — R3912 Poor urinary stream: Secondary | ICD-10-CM | POA: Diagnosis not present

## 2020-10-08 DIAGNOSIS — N401 Enlarged prostate with lower urinary tract symptoms: Secondary | ICD-10-CM | POA: Diagnosis not present

## 2020-10-08 DIAGNOSIS — R39198 Other difficulties with micturition: Secondary | ICD-10-CM | POA: Diagnosis not present

## 2020-10-09 ENCOUNTER — Other Ambulatory Visit: Payer: Self-pay

## 2020-10-09 ENCOUNTER — Other Ambulatory Visit (HOSPITAL_COMMUNITY): Payer: Self-pay | Admitting: Interventional Radiology

## 2020-10-09 ENCOUNTER — Encounter (HOSPITAL_COMMUNITY): Payer: Self-pay

## 2020-10-09 ENCOUNTER — Ambulatory Visit (HOSPITAL_COMMUNITY)
Admission: RE | Admit: 2020-10-09 | Discharge: 2020-10-09 | Disposition: A | Discharge status: Home / Self Care | Payer: Medicare HMO | Source: Ambulatory Visit | Attending: Interventional Radiology | Admitting: Interventional Radiology

## 2020-10-09 ENCOUNTER — Other Ambulatory Visit: Payer: Self-pay | Admitting: *Deleted

## 2020-10-09 DIAGNOSIS — F329 Major depressive disorder, single episode, unspecified: Secondary | ICD-10-CM | POA: Diagnosis not present

## 2020-10-09 DIAGNOSIS — F419 Anxiety disorder, unspecified: Secondary | ICD-10-CM | POA: Diagnosis not present

## 2020-10-09 DIAGNOSIS — L98498 Non-pressure chronic ulcer of skin of other sites with other specified severity: Secondary | ICD-10-CM | POA: Diagnosis not present

## 2020-10-09 DIAGNOSIS — E785 Hyperlipidemia, unspecified: Secondary | ICD-10-CM | POA: Insufficient documentation

## 2020-10-09 DIAGNOSIS — Z79899 Other long term (current) drug therapy: Secondary | ICD-10-CM | POA: Diagnosis not present

## 2020-10-09 DIAGNOSIS — L98499 Non-pressure chronic ulcer of skin of other sites with unspecified severity: Secondary | ICD-10-CM | POA: Diagnosis not present

## 2020-10-09 DIAGNOSIS — Z7982 Long term (current) use of aspirin: Secondary | ICD-10-CM | POA: Diagnosis not present

## 2020-10-09 DIAGNOSIS — R69 Illness, unspecified: Secondary | ICD-10-CM | POA: Diagnosis not present

## 2020-10-09 HISTORY — PX: IR US GUIDE VASC ACCESS RIGHT: IMG2390

## 2020-10-09 HISTORY — PX: IR ANGIOGRAM EXTREMITY RIGHT: IMG652

## 2020-10-09 LAB — BASIC METABOLIC PANEL
Anion gap: 9 (ref 5–15)
BUN: 18 mg/dL (ref 6–20)
CO2: 22 mmol/L (ref 22–32)
Calcium: 9.2 mg/dL (ref 8.9–10.3)
Chloride: 104 mmol/L (ref 98–111)
Creatinine, Ser: 1.64 mg/dL — ABNORMAL HIGH (ref 0.61–1.24)
GFR, Estimated: 48 mL/min — ABNORMAL LOW (ref >60)
Glucose, Bld: 92 mg/dL (ref 70–99)
Potassium: 4.2 mmol/L (ref 3.5–5.1)
Sodium: 135 mmol/L (ref 135–145)

## 2020-10-09 LAB — CBC WITH DIFFERENTIAL/PLATELET
Abs Immature Granulocytes: 0.02 10*3/uL (ref 0.00–0.07)
Basophils Absolute: 0 10*3/uL (ref 0.0–0.1)
Basophils Relative: 1 %
Eosinophils Absolute: 0.1 10*3/uL (ref 0.0–0.5)
Eosinophils Relative: 2 %
HCT: 43.7 % (ref 39.0–52.0)
Hemoglobin: 14.9 g/dL (ref 13.0–17.0)
Immature Granulocytes: 1 %
Lymphocytes Relative: 24 %
Lymphs Abs: 0.9 10*3/uL (ref 0.7–4.0)
MCH: 30.2 pg (ref 26.0–34.0)
MCHC: 34.1 g/dL (ref 30.0–36.0)
MCV: 88.6 fL (ref 80.0–100.0)
Monocytes Absolute: 1.1 10*3/uL — ABNORMAL HIGH (ref 0.1–1.0)
Monocytes Relative: 29 %
Neutro Abs: 1.7 10*3/uL (ref 1.7–7.7)
Neutrophils Relative %: 43 %
Platelets: 237 10*3/uL (ref 150–400)
RBC: 4.93 MIL/uL (ref 4.22–5.81)
RDW: 13.4 % (ref 11.5–15.5)
WBC: 3.9 10*3/uL — ABNORMAL LOW (ref 4.0–10.5)
nRBC: 0 % (ref 0.0–0.2)

## 2020-10-09 LAB — PROTIME-INR
INR: 1 (ref 0.8–1.2)
Prothrombin Time: 13.2 seconds (ref 11.4–15.2)

## 2020-10-09 MED ORDER — NITROGLYCERIN 1 MG/10 ML FOR IR/CATH LAB
INTRA_ARTERIAL | Status: AC
  Filled 2020-10-09: qty 10

## 2020-10-09 MED ORDER — FENTANYL CITRATE (PF) 100 MCG/2ML IJ SOLN
INTRAMUSCULAR | Status: AC
  Filled 2020-10-09: qty 2

## 2020-10-09 MED ORDER — IODIXANOL 320 MG/ML IV SOLN
100.0000 mL | Freq: Once | INTRAVENOUS | Status: AC | PRN
  Administered 2020-10-09: 100 mL via INTRAVENOUS

## 2020-10-09 MED ORDER — FENTANYL CITRATE (PF) 100 MCG/2ML IJ SOLN
INTRAMUSCULAR | Status: AC | PRN
  Administered 2020-10-09: 25 ug via INTRAVENOUS

## 2020-10-09 MED ORDER — NITROGLYCERIN 1 MG/10 ML FOR IR/CATH LAB
INTRA_ARTERIAL | Status: AC | PRN
  Administered 2020-10-09: 100 ug via INTRA_ARTERIAL

## 2020-10-09 MED ORDER — SODIUM CHLORIDE 0.9 % IV SOLN: INTRAVENOUS | Status: DC

## 2020-10-09 MED ORDER — MIDAZOLAM HCL 2 MG/2ML IJ SOLN
INTRAMUSCULAR | Status: AC
  Filled 2020-10-09: qty 2

## 2020-10-09 MED ORDER — LIDOCAINE HCL 1 % IJ SOLN
INTRAMUSCULAR | Status: AC
  Filled 2020-10-09: qty 20

## 2020-10-09 MED ORDER — LIDOCAINE HCL (PF) 1 % IJ SOLN
INTRAMUSCULAR | Status: AC | PRN
  Administered 2020-10-09: 10 mL

## 2020-10-09 NOTE — Discharge Instructions (Signed)
Femoral Site Care This sheet gives you information about how to care for yourself after your procedure. Your health care provider may also give you more specific instructions. If you have problems or questions, contact your health care provider. What can I expect after the procedure?  After the procedure, it is common to have: Bruising that usually fades within 1-2 weeks. Tenderness at the site. Follow these instructions at home: Wound care Follow instructions from your health care provider about how to take care of your insertion site. Make sure you: Wash your hands with soap and water before you change your bandage (dressing). If soap and water are not available, use hand sanitizer. Remove your dressing as told by your health care provider. In 24 hours Do not take baths, swim, or use a hot tub until your health care provider approves. You may shower 24-48 hours after the procedure or as told by your health care provider. Gently wash the site with plain soap and water. Pat the area dry with a clean towel. Do not rub the site. This may cause bleeding. Do not apply powder or lotion to the site. Keep the site clean and dry. Check your femoral site every day for signs of infection. Check for: Redness, swelling, or pain. Fluid or blood. Warmth. Pus or a bad smell. Activity For the first 2-3 days after your procedure, or as long as directed: Avoid climbing stairs as much as possible. Do not squat. Do not lift anything that is heavier than 10 lb (4.5 kg), or the limit that you are told, until your health care provider says that it is safe. For 5 days Rest as directed. Avoid sitting for a long time without moving. Get up to take short walks every 1-2 hours. Do not drive for 24 hours if you were given a medicine to help you relax (sedative). General instructions Take over-the-counter and prescription medicines only as told by your health care provider. Keep all follow-up visits as told by  your health care provider. This is important. Contact a health care provider if you have: A fever or chills. You have redness, swelling, or pain around your insertion site. Get help right away if: The catheter insertion area swells very fast. You pass out. You suddenly start to sweat or your skin gets clammy. The catheter insertion area is bleeding, and the bleeding does not stop when you hold steady pressure on the area. The area near or just beyond the catheter insertion site becomes pale, cool, tingly, or numb. These symptoms may represent a serious problem that is an emergency. Do not wait to see if the symptoms will go away. Get medical help right away. Call your local emergency services (911 in the [place] Do not drive yourself to the hospital. Summary After the procedure, it is common to have bruising that usually fades within 1-2 weeks. Check your femoral site every day for signs of infection. Do not lift anything that is heavier than 10 lb (4.5 kg), or the limit that you are told, until your health care provider says that it is safe. This information is not intended to replace advice given to you by your health care provider. Make sure you discuss any questions you have with your health care provider. Document Revised: 03/27/2017 Document Reviewed: 03/27/2017 Elsevier Patient Education  2020 [employer] 

## 2020-10-09 NOTE — Procedures (Signed)
Interventional Radiology Procedure Note  Procedure:   US guided right CFA access. Right UE angiogram.  Closure with CELT device  Complications: None Medication: 158m NTG   Recommendations:  - 1 hr recovery, then DC home when goals met - advance diet - Do not submerge for 7 days - Routine wound care - follow up with Dr. WEarleen Newportin 2-4 weeks - findings in angiogram report   Signed,  JDulcy Fanny WEarleen Newport DO

## 2020-10-09 NOTE — H&P (Signed)
Chief Complaint: Patient was seen in consultation today for right 4th finger non-healing ulcer  Supervising Physician: Corrie Mckusick  Patient Status: [facility] - Out-pt  History of Present Illness: Danny Hopkins is a 60 y.o. male with past medical history of anxiety/depression, stroke in 2006 who was referred to Vascular Interventional Radiology for evaluation of a non-healing right 4th finger ulcer.  Danny Hopkins was seen in consultation with Dr. Earleen Newport 09/24/20 for evaluation of his vascular anatomy.  Danny Hopkins does have risk factors for vasculitis vs. Atherosclerosis including tobacco and drug exposure, HLD. Plan was made at that time for diagnostic right upper extremity angiogram to evaluate for source of poor supply/healing.  Danny Hopkins elected to proceed and presents for procedure today in his usual state of health.  Danny Hopkins has been NPO today.  Danny Hopkins does take aspirin 55m daily, but no other blood thinners.    Danny Hopkins has a ride home today with his friend, AZenia Resides   Past Medical History:  Diagnosis Date   Anxiety    07/2019   Arthritis    Depression 08/23/2019   History of chicken pox    Hyperlipidemia    2015   Neurologic gait disorder 07/24/2013   Chronic post stroke   Stroke ([facility]    2006    Past Surgical History:  Procedure Laterality Date   cervical staph infection     2006   COLONOSCOPY     2015   INGUINAL HERNIA REPAIR     kindergarten   IR RADIOLOGIST EVAL & MGMT  09/24/2020    Allergies: Patient has no known allergies.  Medications: Prior to Admission medications   Medication Sig Start Date End Date Taking? Authorizing Provider  ALPRAZolam (XANAX) 1 MG tablet TAKE 1 TABLET IN THE MORNING AND 1 AND 1/2 TABLETS IN THE EVENING AS NEEDED Patient taking differently: Take 1 mg by mouth 2 (two) times daily. 08/10/20  Yes JBiagio Borg MD  atorvastatin (LIPITOR) 20 MG tablet TAKE 1 TABLET BY MOUTH EVERY DAY Patient taking differently: Take 20 mg by mouth daily. 07/16/20  Yes JBiagio Borg  MD  baclofen (LIORESAL) 10 MG tablet Take 10 mg by mouth daily as needed (Back spasms). 10/06/19  Yes [provider]  Cholecalciferol (VITAMIN D3) 50 MCG (2000 UT) TABS Take 2,000 Units by mouth daily.   Yes [provider]  clotrimazole-betamethasone (LOTRISONE) cream APPLY TO AFFECTED AREA TWICE A DAY Patient taking differently: Apply 1 application topically 2 (two) times daily. 01/13/20  Yes JBiagio Borg MD  docusate sodium (COLACE) 100 MG capsule Take 200 mg by mouth 2 (two) times daily.   Yes [provider]  fluticasone (FLONASE) 50 MCG/ACT nasal spray Place 2 sprays into both nostrils daily.   Yes [provider]  HYDROcodone-acetaminophen (NORCO) 10-325 MG per tablet Take 1 tablet by mouth every 6 (six) hours. 09/24/12  Yes [provider]  naproxen (NAPROSYN) 500 MG tablet TAKE 1 TABLET (500 MG TOTAL) BY MOUTH 2 (TWO) TIMES DAILY AS NEEDED FOR HEADACHE. 05/26/20  Yes JBiagio Borg MD  omeprazole (PRILOSEC) 20 MG capsule Take 1 capsule (20 mg total) by mouth daily as needed. Patient taking differently: Take 20 mg by mouth daily. 01/07/20 01/06/21 Yes JBiagio Borg MD  sulfamethoxazole-trimethoprim (BACTRIM DS) 800-160 MG tablet Take 1 tablet by mouth 2 (two) times daily. 09/29/20  Yes [provider]  SUMAtriptan (IMITREX) 100 MG tablet Take 1 tablet (100 mg total) by mouth every 2 (two) hours  as needed for migraine or headache. May repeat in 2 hours if headache persists or recurs. 01/07/20  Yes Biagio Borg, MD  tamsulosin (FLOMAX) 0.4 MG CAPS capsule TAKE 1 CAPSULE BY MOUTH EVERY DAY Patient taking differently: Take 0.4 mg by mouth daily. 08/11/20  Yes Biagio Borg, MD  fexofenadine (ALLEGRA) 180 MG tablet Take 1 tablet (180 mg total) by mouth daily. Patient not taking: Reported on 10/06/2020 08/19/20 08/19/21  Biagio Borg, MD  guaiFENesin (MUCINEX) 600 MG 12 hr tablet Take 2 tablets (1,200 mg total) by mouth 2 (two) times daily as  needed. Patient not taking: Reported on 10/06/2020 08/19/20   Biagio Borg, MD  mupirocin nasal ointment (BACTROBAN) 2 % Place 1 application into the nose 2 (two) times daily. Use one-half of tube in each nostril twice daily for five (5) days. After application, press sides of nose together and gently massage. Patient not taking: Reported on 10/06/2020 05/19/20   Biagio Borg, MD  PARoxetine (PAXIL) 10 MG tablet Take 1 tablet (10 mg total) by mouth daily. Patient not taking: Reported on 10/06/2020 02/11/20   Biagio Borg, MD  [facility] injection  04/21/19   [provider]  triamcinolone (NASACORT) 55 MCG/ACT AERO nasal inhaler Place 2 sprays into the nose daily. Patient not taking: Reported on 10/06/2020 08/19/20   Biagio Borg, MD     Family History  Problem Relation Age of Onset   Stomach cancer Neg Hx    Rectal cancer Neg Hx    Pancreatic cancer Neg Hx    Colon cancer Neg Hx    Colon polyps Neg Hx    Esophageal cancer Neg Hx     Social History   Socioeconomic History   Marital status: Single    Spouse name: Not on file   Number of children: Not on file   Years of education: 12   Highest education level: Not on file  Occupational History   Occupation: DISABILITY    Employer: UNEMPLOYED  Tobacco Use   Smoking status: Never   Smokeless tobacco: Never  Vaping Use   Vaping Use: Never used  Substance and Sexual Activity   Alcohol use: Yes    Alcohol/week: 2.0 standard drinks    Types: 2 Cans of beer per week    Comment: 2-3 beers per week   Drug use: Not Currently    Types: Marijuana   Sexual activity: Not Currently  Other Topics Concern   Not on file  Social History Narrative   Regular exercise-no   Caffeine Use-no         Social Determinants of Health   Financial Resource Strain: Not on file  Food Insecurity: Not on file  Transportation Needs: Not on file  Physical Activity: Not on file  Stress: Not on file  Social Connections: Not on file      Review of Systems: A 12 point ROS discussed and pertinent positives are indicated in the HPI above.  All other systems are negative.  Review of Systems  Constitutional:  Negative for fatigue and fever.  Respiratory:  Negative for cough and shortness of breath.   Cardiovascular:  Negative for chest pain.  Gastrointestinal:  Negative for abdominal pain, diarrhea and nausea.  Musculoskeletal:  Negative for back pain.  Psychiatric/Behavioral:  Negative for behavioral problems and confusion.    Vital Signs: BP 109/66 (BP Location: Right Arm)   Pulse 78   Temp 98.3 F (36.8 C) (Oral)   Resp  18   Ht _0  (1.702 m)   Wt 130 lb (59 kg)   SpO2 100%   BMI 20.36 kg/m   Physical Exam Vitals and nursing note reviewed.  Constitutional:      General: Danny Hopkins is not in acute distress.    Appearance: Danny Hopkins is not ill-appearing.  HENT:     Mouth/Throat:     Mouth: Mucous membranes are moist.     Pharynx: Oropharynx is clear.  Cardiovascular:     Rate and Rhythm: Normal rate and regular rhythm.  Pulmonary:     Effort: Pulmonary effort is normal.     Breath sounds: Normal breath sounds.  Skin:    General: Skin is warm and dry.  Neurological:     General: No focal deficit present.     Mental Status: Danny Hopkins is alert and oriented to person, place, and time. Mental status is at baseline.  Psychiatric:        Mood and Affect: Mood normal.        Behavior: Behavior normal.        Thought Content: Thought content normal.        Judgment: Judgment normal.     MD Evaluation Airway: WNL Heart: WNL Abdomen: WNL Chest/ Lungs: WNL ASA  Classification: 3 Mallampati/Airway Score: Two   Imaging: US ARTERIAL SEG MULTIPLE LE (ABI, SEGMENTAL PRESSURES, PVR'S)  Result Date: 10/05/2020 CLINICAL DATA:  History of left great toe wound EXAM: NONINVASIVE PHYSIOLOGIC VASCULAR STUDY OF BILATERAL LOWER EXTREMITIES TECHNIQUE: Non-invasive vascular evaluation of both lower extremities was performed at rest,  including calculation of ankle-brachial indices, multiple segmental pressure evaluation, segmental Doppler and segmental pulse volume recording. COMPARISON:  None. FINDINGS: Right Lower Extremity Resting ABI:  1.33 Segmental Pressures: Normal segmental pressures, no significant (20 mmHg) pressure gradient between adjacent segments. Arterial Waveforms: Normal tri-phasic arterial waveforms. PVRs: Markedly blunted digital waveforms. Left Lower Extremity: Resting ABI: 1.1 Segmental Pressures: Normal segmental pressures, no significant (20 mmHg) pressure gradient between adjacent segments. Arterial Waveforms: Normal tri-phasic arterial waveforms. PVRs: Markedly blunted digital waveforms. Other: Symmetric upper extremity pressures. Ankle Brachial index > 1.4 Non diagnostic secondary to incompressible vessel calcifications 1.0-1.4       Normal 0.9-0.99     Borderline PAD 0.8-0.89     Mild PAD 0.5-0.79     Moderate PAD < 0.5          Severe PAD Toe Brachial Index Normal     >0.65 Moderate  0.53-0.64 Severe     <0.23 Toe Pressures Absolute toe pressure >37mHg sufficient for wound healing. Toe pressures <567mg = critical limb ischemia. IMPRESSION: 1. Normal bilateral resting ankle-brachial indices. 2. Markedly blunted digital waveforms bilaterally suggests small vessels disease. Electronically Signed   By: HeJacqulynn Cadet.D.   On: 10/05/2020 13:32   [place] SEG MULTIPLE (UE,WBI,SEG PRESSURES,PVR,ARMS)  Result Date: 09/30/2020 CLINICAL DATA:  [age]ear old male with history of right fourth digit ulcer. EXAM: BILATERAL UPPER EXTREMITY ARTERIAL DUPLEX SCAN TECHNIQUE: Gray-scale sonography as well as color Doppler and duplex ultrasound was performed to evaluate the arteries of both upper extremities. COMPARISON:  None. FINDINGS: Right wrist brachial index = 1.20 Left wrist brachial index = 1.23 Normal arterial waveforms throughout the bilateral upper extremities. Normal PVR waveforms throughout. No significant pressure  gradient. IMPRESSION: Normal bilateral wrist brachial indices. No evidence of flow-limiting stenosis in the bilateral upper extremity arteries. DyRuthann CancerMD [facility] Electronically Signed   By: DyRuthann CancerD  On: 09/30/2020 12:49   IR Radiologist Eval & Mgmt  Result Date: 09/24/2020 Please refer to notes tab for details about interventional procedure. (Op Note)   Labs:  CBC: Recent Labs    05/19/20 1443 10/09/20 0830  WBC 5.8 3.9*  HGB 16.3 14.9  HCT 48.3 43.7  PLT 169.0 237    COAGS: Recent Labs    10/09/20 0830  INR 1.0    BMP: Recent Labs    05/19/20 1443 10/09/20 0830  NA 140 135  K 4.3 4.2  CL 100 104  CO2 34* 22  GLUCOSE 67* 92  BUN 22 18  CALCIUM 9.6 9.2  CREATININE 0.98 1.64*  GFRNONAA  --  48*    LIVER FUNCTION TESTS: Recent Labs    05/19/20 1443  BILITOT 1.0  AST 14  ALT 14  ALKPHOS 74  PROT 6.6  ALBUMIN 4.2    TUMOR MARKERS: No results for input(s): AFPTM, CEA, CA199, CHROMGRNA in the last 8760 hours.  Assessment and Plan: Patient with past medical history of HLD, poor healing toe wound (now healed) presents with complaint of non-healing 4th right finger wound.   Case reviewed by Dr. Earleen Newport who has met with the patient in consultation and offered further diagnostic work-up with angiogram.  Danny Hopkins elects to proceed. Danny Hopkins presents today in his usual state of health.  Danny Hopkins has been NPO and is not currently on blood thinners. Does take aspirin 61m.   Patient lives at home alone.  Will plan to proceed with IV pain medication only, no moderate sedation.   Risks and benefits were discussed with the patient including, but not limited to bleeding, infection, vascular injury or contrast induced renal failure.  This interventional procedure involves the use of X-rays and because of the nature of the planned procedure, it is possible that we will have prolonged use of X-ray  fluoroscopy.  Potential radiation risks to you include (but are not limited to) the following: - A slightly elevated risk for cancer  several years later in life. This risk is typically less than 0.5% percent. This risk is low in comparison to the normal incidence of human cancer, which is 33% for women and 50% for men according to the [place] - Radiation induced injury can include skin redness, resembling a rash, tissue breakdown / ulcers and hair loss (which can be temporary or permanent).   The likelihood of either of these occurring depends on the difficulty of the procedure and whether you are sensitive to radiation due to previous procedures, disease, or genetic conditions.   IF your procedure requires a prolonged use of radiation, you will be notified and given written instructions for further action.  It is your responsibility to monitor the irradiated area for the 2 weeks following the procedure and to notify your physician if you are concerned that you have suffered a radiation induced injury.    All of the patient's questions were answered, patient is agreeable to proceed.  Consent signed and in chart.  Thank you for this interesting consult.  I greatly enjoyed meeting MKrystal Delducaand look forward to participating in their care.  A copy of this report was sent to the requesting provider on this date.  Electronically Signed: KDocia Barrier PA 10/09/2020, 10:25 AM   I spent a total of  30 Minutes   in face to face in clinical consultation, greater than 50% of which was counseling/coordinating care for 4th right finger ulcer.

## 2020-10-09 NOTE — Patient Outreach (Signed)
Danny Hopkins) Care Management  10/09/2020  Danny Hopkins Dec 25, 1960 [id number]  [facility] Telephone Assessment/Screen for MD referral for screening Referral Date: 10/07/20 Referral Source: MD referral - Dr Harlan Stains  Referral Reason: for [place]: [place] attempt # 1 successful at [phone] Patient is able to verify HIPAA, DOB and address Reviewed and addressed referral to [facility] with patient Consent: Danny Hopkins [facility] RN CM reviewed [facility] services with patient. Patient gave verbal consent for services [facility] telephonic RN CM.    [facility]N progression: Initial assessment completed   Assessment  He went for a vascular procedure today He has not applied for medicaid services [facility] RN CM discussed with him that most personal care services without medicaid is an out of pocket expense He also does not have Veteran's administration (VA) benefits Getting therapy now He voices interest in receiving home services He shared his experience of care with another provider Empathy provided He had a sore on his finger. 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  Can feed self but reports his walking is worsening and he does not know how long he will be able to manage well at home  Hx left side weakness from stroke issues  Falls 3 times but no injury only soreness   Patient Active Problem List   Diagnosis Date Noted   Allergic rhinitis 08/23/2020   Acute dysfunction of right eustachian tube 08/23/2020   Dysphagia 06/10/2020   Sore in nose 05/21/2020   Weight loss 05/19/2020   Left arm pain 04/19/2020   Migraines 01/07/2020   Depression 08/23/2019    Spasticity 05/29/2019   Vitamin D deficiency 08/25/2018   Laceration of left hand 09/27/2017   Acute pharyngitis 07/14/2017   Chronic tonsillitis 07/12/2017   Chronic pain syndrome 06/22/2017   Bradycardia 06/22/2017   Abnormal ECG 06/22/2017   Former smoker 06/21/2016   Scalp pain 07/30/2015   Muscle spasticity 06/24/2015   Dandruff 06/24/2015   Lymphadenitis 06/24/2015   Burning with urination 01/25/2015   Balanitis 01/25/2015   Sleeping difficulties 02/04/2014   Bladder neck obstruction 02/04/2014   Hyperlipidemia 02/04/2014   Anterior spinal artery compression syndrome 10/10/2013   Other malaise and fatigue 07/24/2013   Stroke (Combine) 07/24/2013   Encounter for well adult exam with abnormal findings 07/24/2013   Neurologic gait disorder 07/24/2013   Displacement of lumbar intervertebral disc without myelopathy 05/29/2013   Cervical spondylosis with myelopathy 01/30/2013   Insomnia 10/12/2012   Generalized anxiety disorder 10/12/2012   Eczema 10/12/2012   History of stroke 10/12/2012   Corn or callus 07/07/2010   ED (erectile dysfunction) of organic origin 07/07/2010   Neurogenic bladder 06/10/2010   Unspecified urinary incontinence 06/10/2010   Hemiplegia ([place]) 06/09/2010   Past Medical History:  Diagnosis Date   Anxiety    07/2019   Arthritis    Depression 08/23/2019   History of chicken pox    Hyperlipidemia    2015   Neurologic gait disorder 07/24/2013   Chronic post stroke   Stroke [facility])    2006    Current Outpatient Medications on File Prior to Visit  Medication  Sig Dispense Refill   ALPRAZolam (XANAX) 1 MG tablet TAKE 1 TABLET IN THE MORNING AND 1 AND 1/2 TABLETS IN THE EVENING AS NEEDED (Patient taking differently: Take 1 mg by mouth 2 (two) times daily.) 75 tablet 5   atorvastatin (LIPITOR) 20 MG tablet TAKE 1 TABLET BY MOUTH EVERY DAY (Patient taking differently: Take 20 mg by mouth daily.) 90 tablet 3   baclofen (LIORESAL) 10 MG tablet Take 10 mg by  mouth daily as needed (Back spasms).     Cholecalciferol (VITAMIN D3) 50 MCG (2000 UT) TABS Take 2,000 Units by mouth daily.     clotrimazole-betamethasone (LOTRISONE) cream APPLY TO AFFECTED AREA TWICE A DAY (Patient taking differently: Apply 1 application topically 2 (two) times daily.) 30 g 2   docusate sodium (COLACE) 100 MG capsule Take 200 mg by mouth 2 (two) times daily.     fexofenadine (ALLEGRA) 180 MG tablet Take 1 tablet (180 mg total) by mouth daily. (Patient not taking: Reported on 10/06/2020) 30 tablet 5   fluticasone (FLONASE) 50 MCG/ACT nasal spray Place 2 sprays into both nostrils daily.     guaiFENesin (MUCINEX) 600 MG 12 hr tablet Take 2 tablets (1,200 mg total) by mouth 2 (two) times daily as needed. (Patient not taking: Reported on 10/06/2020) 60 tablet 1   HYDROcodone-acetaminophen (NORCO) 10-325 MG per tablet Take 1 tablet by mouth every 6 (six) hours.     mupirocin nasal ointment (BACTROBAN) 2 % Place 1 application into the nose 2 (two) times daily. Use one-half of tube in each nostril twice daily for five (5) days. After application, press sides of nose together and gently massage. (Patient not taking: Reported on 10/06/2020) 10 g 0   naproxen (NAPROSYN) 500 MG tablet TAKE 1 TABLET (500 MG TOTAL) BY MOUTH 2 (TWO) TIMES DAILY AS NEEDED FOR HEADACHE. 60 tablet 2   omeprazole (PRILOSEC) 20 MG capsule Take 1 capsule (20 mg total) by mouth daily as needed. (Patient taking differently: Take 20 mg by mouth daily.) 30 capsule 11   PARoxetine (PAXIL) 10 MG tablet Take 1 tablet (10 mg total) by mouth daily. (Patient not taking: Reported on 10/06/2020) 90 tablet 3   SHINGRIX injection      sulfamethoxazole-trimethoprim (BACTRIM DS) 800-160 MG tablet Take 1 tablet by mouth 2 (two) times daily.     SUMAtriptan (IMITREX) 100 MG tablet Take 1 tablet (100 mg total) by mouth every 2 (two) hours as needed for migraine or headache. May repeat in 2 hours if headache persists or recurs. 10 tablet 11    tamsulosin (FLOMAX) 0.4 MG CAPS capsule TAKE 1 CAPSULE BY MOUTH EVERY DAY (Patient taking differently: Take 0.4 mg by mouth daily.) 90 capsule 3   No current facility-administered medications on file prior to visit.   Plan: Patient agrees to the care plan and follow up  within the next 30 business days   Pamalee Marcoe L. [name], RN, BSN, [place] Coordinator Office number [phone] Mobile number [phone]  Main [facility] number [phone] Fax number [phone]

## 2020-10-09 NOTE — Sedation Documentation (Signed)
Procedural case was done only using Fentanyl for sedation per Dr. Earleen Newport.

## 2020-10-10 ENCOUNTER — Emergency Department (HOSPITAL_BASED_OUTPATIENT_CLINIC_OR_DEPARTMENT_OTHER)
Admission: EM | Admit: 2020-10-10 | Discharge: 2020-10-11 | Disposition: A | Discharge status: Home / Self Care | Payer: Medicare HMO | Attending: Emergency Medicine | Admitting: Emergency Medicine

## 2020-10-10 ENCOUNTER — Other Ambulatory Visit: Payer: Self-pay

## 2020-10-10 DIAGNOSIS — K5900 Constipation, unspecified: Secondary | ICD-10-CM | POA: Diagnosis not present

## 2020-10-10 DIAGNOSIS — K5641 Fecal impaction: Secondary | ICD-10-CM | POA: Diagnosis not present

## 2020-10-10 NOTE — ED Triage Notes (Addendum)
Pt states, "I am constipated." Last BM was yesterday. Pt states he uses a suppository almost everyday due to meds he is taking but it has not worked this evening. Pt states, "I feel like it is right there and it wont come out" and it is only painful when he is trying to "force it".

## 2020-10-10 NOTE — Discharge Instructions (Addendum)
If you have recurrence of constipation, take a bottle of magnesium citrate.

## 2020-10-10 NOTE — ED Notes (Signed)
Pt had a BM in the lobby while sitting his wheelchair. Tech notified for assistance to clean up.

## 2020-10-10 NOTE — ED Provider Notes (Signed)
[facility] EMERGENC[facility] DEPT Provider Note   CSN: [id number] Arrival date & time: 10/10/20  1900     History Chief Complaint  Patient presents with   Constipation    Danny Hopkins is a 60 y.o. male.  The history is provided by the patient.  Constipation He has history of hyperlipidemia, stroke, neurogenic bladder urologist and comes in because of constipation.  He states he has not had a bowel movement within the last 2 days.  He normally uses suppositories, but they have not been effective.  He is also tried an enema without benefit.  He denies any abdominal pain, nausea, vomiting.   Past Medical History:  Diagnosis Date   Anxiety    07/2019   Arthritis    Depression 08/23/2019   History of chicken pox    Hyperlipidemia    2015   Neurologic gait disorder 07/24/2013   Chronic post stroke   Stroke [facility])    2006    Patient Active Problem List   Diagnosis Date Noted   Allergic rhinitis 08/23/2020   Acute dysfunction of right eustachian tube 08/23/2020   Dysphagia 06/10/2020   Sore in nose 05/21/2020   Weight loss 05/19/2020   Left arm pain 04/19/2020   Migraines 01/07/2020   Depression 08/23/2019   Spasticity 05/29/2019   Vitamin D deficiency 08/25/2018   Laceration of left hand 09/27/2017   Acute pharyngitis 07/14/2017   Chronic tonsillitis 07/12/2017   Chronic pain syndrome 06/22/2017   Bradycardia 06/22/2017   Abnormal ECG 06/22/2017   Former smoker 06/21/2016   Scalp pain 07/30/2015   Muscle spasticity 06/24/2015   Dandruff 06/24/2015   Lymphadenitis 06/24/2015   Burning with urination 01/25/2015   Balanitis 01/25/2015   Sleeping difficulties 02/04/2014   Bladder neck obstruction 02/04/2014   Hyperlipidemia 02/04/2014   Anterior spinal artery compression syndrome 10/10/2013   Other malaise and fatigue 07/24/2013   Stroke ([place]) 07/24/2013   Encounter for well adult exam with abnormal findings 07/24/2013   Neurologic gait disorder  07/24/2013   Displacement of lumbar intervertebral disc without myelopathy 05/29/2013   Cervical spondylosis with myelopathy 01/30/2013   Insomnia 10/12/2012   Generalized anxiety disorder 10/12/2012   Eczema 10/12/2012   History of stroke 10/12/2012   Corn or callus 07/07/2010   ED (erectile dysfunction) of organic origin 07/07/2010   Neurogenic bladder 06/10/2010   Unspecified urinary incontinence 06/10/2010   Hemiplegia ([place]) 06/09/2010    Past Surgical History:  Procedure Laterality Date   cervical staph infection     2006   COLONOSCOPY     2015   INGUINAL HERNIA REPAIR     kindergarten   IR ANGIOGRAM EXTREMITY RIGHT  10/09/2020   IR RADIOLOGIST EVAL & MGMT  09/24/2020   IR US GUIDE VASC ACCESS RIGHT  10/09/2020       Family History  Problem Relation Age of Onset   Stomach cancer Neg Hx    Rectal cancer Neg Hx    Pancreatic cancer Neg Hx    Colon cancer Neg Hx    Colon polyps Neg Hx    Esophageal cancer Neg Hx     Social History   Tobacco Use   Smoking status: Never   Smokeless tobacco: Never  Vaping Use   Vaping Use: Never used  Substance Use Topics   Alcohol use: Yes    Alcohol/week: 2.0 standard drinks    Types: 2 Cans of beer per week    Comment: 2-3 beers per week  Drug use: Not Currently    Types: Marijuana    Home Medications Prior to Admission medications   Medication Sig Start Date End Date Taking? Authorizing Provider  ALPRAZolam (XANAX) 1 MG tablet TAKE 1 TABLET IN THE MORNING AND 1 AND 1/2 TABLETS IN THE EVENING AS NEEDED Patient taking differently: Take 1 mg by mouth 2 (two) times daily. 08/10/20   Biagio Borg, MD  atorvastatin (LIPITOR) 20 MG tablet TAKE 1 TABLET BY MOUTH EVERY DAY Patient taking differently: Take 20 mg by mouth daily. 07/16/20   Biagio Borg, MD  baclofen (LIORESAL) 10 MG tablet Take 10 mg by mouth daily as needed (Back spasms). 10/06/19   [provider]  Cholecalciferol (VITAMIN D3) 50 MCG (2000 UT) TABS Take  2,000 Units by mouth daily.    [provider]  clotrimazole-betamethasone (LOTRISONE) cream APPLY TO AFFECTED AREA TWICE A DAY Patient taking differently: Apply 1 application topically 2 (two) times daily. 01/13/20   Biagio Borg, MD  docusate sodium (COLACE) 100 MG capsule Take 200 mg by mouth 2 (two) times daily.    [provider]  fexofenadine (ALLEGRA) 180 MG tablet Take 1 tablet (180 mg total) by mouth daily. Patient not taking: Reported on 10/06/2020 08/19/20 08/19/21  Biagio Borg, MD  fluticasone [facility]) 50 MCG/ACT nasal spray Place 2 sprays into both nostrils daily.    [provider]  guaiFENesin (MUCINEX) 600 MG 12 hr tablet Take 2 tablets (1,200 mg total) by mouth 2 (two) times daily as needed. Patient not taking: Reported on 10/06/2020 08/19/20   Biagio Borg, MD  HYDROcodone-acetaminophen [facility]) 10-325 MG per tablet Take 1 tablet by mouth every 6 (six) hours. 09/24/12   [provider]  mupirocin nasal ointment (BACTROBAN) 2 % Place 1 application into the nose 2 (two) times daily. Use one-half of tube in each nostril twice daily for five (5) days. After application, press sides of nose together and gently massage. Patient not taking: Reported on 10/06/2020 05/19/20   Biagio Borg, MD  naproxen (NAPROSYN) 500 MG tablet TAKE 1 TABLET (500 MG TOTAL) BY MOUTH 2 (TWO) TIMES DAILY AS NEEDED FOR HEADACHE. 05/26/20   Biagio Borg, MD  omeprazole (PRILOSEC) 20 MG capsule Take 1 capsule (20 mg total) by mouth daily as needed. Patient taking differently: Take 20 mg by mouth daily. 01/07/20 01/06/21  Biagio Borg, MD  PARoxetine (PAXIL) 10 MG tablet Take 1 tablet (10 mg total) by mouth daily. Patient not taking: Reported on 10/06/2020 02/11/20   Biagio Borg, MD  [facility] injection  04/21/19   [provider]  sulfamethoxazole-trimethoprim (BACTRIM DS) 800-160 MG tablet Take 1 tablet by mouth 2 (two) times daily. 09/29/20   [provider]   SUMAtriptan (IMITREX) 100 MG tablet Take 1 tablet (100 mg total) by mouth every 2 (two) hours as needed for migraine or headache. May repeat in 2 hours if headache persists or recurs. 01/07/20   Biagio Borg, MD  tamsulosin (FLOMAX) 0.4 MG CAPS capsule TAKE 1 CAPSULE BY MOUTH EVERY DAY Patient taking differently: Take 0.4 mg by mouth daily. 08/11/20   Biagio Borg, MD  triamcinolone (NASACORT) 55 MCG/ACT AERO nasal inhaler Place 2 sprays into the nose daily. Patient not taking: Reported on 10/06/2020 08/19/20   Biagio Borg, MD    Allergies    Patient has no known allergies.  Review of Systems   Review of Systems  Gastrointestinal:  Positive for  constipation.  All other systems reviewed and are negative.  Physical Exam Updated Vital Signs BP 125/85 (BP Location: Left Arm)   Pulse 83   Temp 98 F (36.7 C) (Oral)   Resp 17   Ht 5\' 7"  (1.702 m)   Wt 59 kg   SpO2 99%   BMI 20.36 kg/m   Physical Exam Vitals and nursing note reviewed.  60 year old male, resting comfortably and in no acute distress. Vital signs are normal. Oxygen saturation is 99%, which is normal. Head is normocephalic and atraumatic. PERRLA, EOMI. Oropharynx is clear. Neck is nontender and supple without adenopathy or JVD. Back is nontender and there is no CVA tenderness. Lungs are clear without rales, wheezes, or rhonchi. Chest is nontender. Heart has regular rate and rhythm without murmur. Abdomen is soft, flat, nontender without masses or hepatosplenomegaly and peristalsis is normoactive. Rectal: Normal sphincter tone.  Large somewhat soft impaction present.  Stool is brown. Extremities have no cyanosis or edema, full range of motion is present. Skin is warm and dry without rash. Neurologic: Mental status is normal, cranial nerves are intact, moves all extremities equally.  ED Results / Procedures / Treatments    Procedures Fecal disimpaction  Date/Time: 10/10/2020 11:09 PM Performed by: Delora Fuel,  MD Authorized by: Delora Fuel, MD  Consent: Verbal consent obtained. Written consent not obtained. Risks and benefits: risks, benefits and alternatives were discussed Consent given by: patient Patient understanding: patient states understanding of the procedure being performed Patient consent: the patient's understanding of the procedure matches consent given Procedure consent: procedure consent matches procedure scheduled Relevant documents: relevant documents present and verified Site marked: the operative site was marked Required items: required blood products, implants, devices, and special equipment available Patient identity confirmed: verbally with patient and arm band Time out: Immediately prior to procedure a "time out" was called to verify the correct patient, procedure, equipment, support staff and site/side marked as required. Local anesthesia used: no  Anesthesia: Local anesthesia used: no  Sedation: Patient sedated: no  Patient tolerance: patient tolerated the procedure well with no immediate complications     Medications Ordered in ED Medications - No data to display  ED Course  I have reviewed the triage vital signs and the nursing notes.  MDM Rules/Calculators/A&P                         Constipation with fecal impaction.  He is manually disimpacted and will be given a soapsuds enema.  Old records are reviewed, and he has no relevant past visits.  He feels better following above-noted treatment.  He is discharged, advised to use magnesium citrate if he has recurrence of constipation.  Final Clinical Impression(s) / ED Diagnoses Final diagnoses:  Fecal impaction in rectum [facility])    Rx / DC Orders ED Discharge Orders     None        Delora Fuel, MD 82/50/03 2357

## 2020-10-11 NOTE — ED Notes (Signed)
Pt verbalizes understanding of discharge instructions. Opportunity for questioning and answers were provided. [place] removed by staff, pt discharged from ED to home.  

## 2020-10-13 ENCOUNTER — Encounter (HOSPITAL_COMMUNITY): Payer: Self-pay

## 2020-10-13 ENCOUNTER — Telehealth: Payer: Self-pay | Admitting: Family Medicine

## 2020-10-13 ENCOUNTER — Other Ambulatory Visit: Payer: Self-pay | Admitting: Internal Medicine

## 2020-10-13 DIAGNOSIS — Z87891 Personal history of nicotine dependence: Secondary | ICD-10-CM | POA: Diagnosis not present

## 2020-10-13 DIAGNOSIS — R269 Unspecified abnormalities of gait and mobility: Secondary | ICD-10-CM | POA: Diagnosis not present

## 2020-10-13 DIAGNOSIS — I69398 Other sequelae of cerebral infarction: Secondary | ICD-10-CM | POA: Diagnosis not present

## 2020-10-13 DIAGNOSIS — R001 Bradycardia, unspecified: Secondary | ICD-10-CM | POA: Diagnosis not present

## 2020-10-13 DIAGNOSIS — Z9181 History of falling: Secondary | ICD-10-CM | POA: Diagnosis not present

## 2020-10-13 DIAGNOSIS — I776 Arteritis, unspecified: Secondary | ICD-10-CM

## 2020-10-13 DIAGNOSIS — R69 Illness, unspecified: Secondary | ICD-10-CM | POA: Diagnosis not present

## 2020-10-13 HISTORY — PX: IR INTRAVASCULAR ULTRASOUND NON CORONARY: IMG6085

## 2020-10-13 NOTE — Telephone Encounter (Signed)
Dr.Wagner- radiologist has called the office wanting to speak to [place] on behalf of the patient.  Please advise.  Dr.Wagner cell- [phone]

## 2020-10-13 NOTE — Telephone Encounter (Signed)
Called Dr Earleen Newport [facility]; will try to call back

## 2020-10-14 NOTE — Telephone Encounter (Signed)
Spoke to Dr Earleen Newport by phone approx [phone]  Pt does not appear to have primarily atheroembolic phenomonon as initially considered and referred for evaluation per cardilogy  Rather pt has appearance more of significant vasculitis of the wrist and hand;  recommendation was referral to Rheumatology  This will be done;   Staff to please inform pt, I will do referral and he should hopefully hear soon

## 2020-10-14 NOTE — Addendum Note (Signed)
Addended by: Biagio Borg on: 10/14/2020 09:31 PM   Modules accepted: Orders

## 2020-10-15 ENCOUNTER — Other Ambulatory Visit: Payer: Self-pay | Admitting: Interventional Radiology

## 2020-10-15 DIAGNOSIS — L98499 Non-pressure chronic ulcer of skin of other sites with unspecified severity: Secondary | ICD-10-CM

## 2020-10-15 NOTE — Telephone Encounter (Signed)
Patient notified

## 2020-10-19 DIAGNOSIS — L98499 Non-pressure chronic ulcer of skin of other sites with unspecified severity: Secondary | ICD-10-CM | POA: Diagnosis not present

## 2020-10-19 DIAGNOSIS — K5909 Other constipation: Secondary | ICD-10-CM | POA: Diagnosis not present

## 2020-10-20 DIAGNOSIS — R269 Unspecified abnormalities of gait and mobility: Secondary | ICD-10-CM | POA: Diagnosis not present

## 2020-10-20 DIAGNOSIS — I69398 Other sequelae of cerebral infarction: Secondary | ICD-10-CM | POA: Diagnosis not present

## 2020-10-20 DIAGNOSIS — Z87891 Personal history of nicotine dependence: Secondary | ICD-10-CM | POA: Diagnosis not present

## 2020-10-20 DIAGNOSIS — R001 Bradycardia, unspecified: Secondary | ICD-10-CM | POA: Diagnosis not present

## 2020-10-20 DIAGNOSIS — R69 Illness, unspecified: Secondary | ICD-10-CM | POA: Diagnosis not present

## 2020-10-20 DIAGNOSIS — Z9181 History of falling: Secondary | ICD-10-CM | POA: Diagnosis not present

## 2020-10-27 ENCOUNTER — Other Ambulatory Visit: Payer: Self-pay | Admitting: *Deleted

## 2020-10-27 ENCOUNTER — Ambulatory Visit
Admission: RE | Admit: 2020-10-27 | Discharge: 2020-10-27 | Disposition: A | Discharge status: Home / Self Care | Payer: Medicare HMO | Source: Ambulatory Visit | Attending: Interventional Radiology | Admitting: Interventional Radiology

## 2020-10-27 ENCOUNTER — Encounter: Payer: Self-pay | Admitting: *Deleted

## 2020-10-27 DIAGNOSIS — L98499 Non-pressure chronic ulcer of skin of other sites with unspecified severity: Secondary | ICD-10-CM | POA: Diagnosis not present

## 2020-10-27 DIAGNOSIS — Z9889 Other specified postprocedural states: Secondary | ICD-10-CM | POA: Diagnosis not present

## 2020-10-27 HISTORY — PX: IR RADIOLOGIST EVAL & MGMT: IMG5224

## 2020-10-27 NOTE — Progress Notes (Addendum)
Chief Complaint: Right 4th finger ulcer   Referring Physician(s): Weingold,Matthew  PCP: Dr. Harlan Stains Rheumatology: Dr. Vernelle Emerald (appt pending 11/02/20)   History of Present Illness: Danny Hopkins is a 60 y.o. male presenting today to [facility] for scheduled follow-up after right UE angiogram.  He has history of right fourth finger ulcer.    [name] is again here by himself for interview.   We met Mr Frith 09/24/20, and performed his diagnostic angiogram 10/09/20.    Since then he tells me that he has been soaking his right hand in warm, epsom salt bath.  He describes a "black scab" at the tip of his right fourth digit that has recently "peeled off."  He denies any new digital lesions of the hands or feet.  He denies any fevers, rigors, chills.  He does say that he feels that the tip of the digit is slightly swollen, but feels the same without increasing pain.    As previously, he is denying any recent smoking, marijuana use, or cocaine use (but did use these in the past years ago).    He has a follow up with Dr. Burney Gauze 8/16, 10am.   He has an appointment to establish Rheumatology care with Dr. Benjamine Mola 11/02/20.   He has established care with new PCP, Dr. Harlan Stains.    In addition to the angiogram, we also performed non-invasive testing of the upper and lower extremity.  UE, 09/30/20 = the wrist/brachial index are normal in the left and right upper extremity.  Segmental exam shows no significant occlusive disease of the bilateral UE to the wrist.   LE, 10/05/20 = The left and right ABI are normal, with segmental exam normal to the ankles.  The SBP's were undetectable in the right and left toes, and the PPG's were flatline in the toes  Baseline images 09/24/20        Updated 10/27/20:        Past Medical History:  Diagnosis Date   Anxiety    07/2019   Arthritis    Depression 08/23/2019   History of chicken pox    Hyperlipidemia    2015    Neurologic gait disorder 07/24/2013   Chronic post stroke   Stroke [facility])    2006    Past Surgical History:  Procedure Laterality Date   cervical staph infection     2006   COLONOSCOPY     2015   INGUINAL HERNIA REPAIR     kindergarten   IR ANGIOGRAM EXTREMITY RIGHT  10/09/2020   IR INTRAVASCULAR ULTRASOUND NON CORONARY  10/13/2020   IR RADIOLOGIST EVAL & MGMT  09/24/2020   IR RADIOLOGIST EVAL & MGMT  10/27/2020   IR US GUIDE VASC ACCESS RIGHT  10/09/2020    Allergies: Patient has no known allergies.  Medications: Prior to Admission medications   Medication Sig Start Date End Date Taking? Authorizing Provider  ALPRAZolam (XANAX) 1 MG tablet TAKE 1 TABLET IN THE MORNING AND 1 AND 1/2 TABLETS IN THE EVENING AS NEEDED Patient taking differently: Take 1 mg by mouth 2 (two) times daily. 08/10/20   Biagio Borg, MD  atorvastatin (LIPITOR) 20 MG tablet TAKE 1 TABLET BY MOUTH EVERY DAY Patient taking differently: Take 20 mg by mouth daily. 07/16/20   Biagio Borg, MD  baclofen (LIORESAL) 10 MG tablet Take 10 mg by mouth daily as needed (Back spasms). 10/06/19   [provider]  Cholecalciferol (VITAMIN D3) 50 MCG (  2000 UT) TABS Take 2,000 Units by mouth daily.    [provider]  clotrimazole-betamethasone (LOTRISONE) cream APPLY TO AFFECTED AREA TWICE A DAY Patient taking differently: Apply 1 application topically 2 (two) times daily. 01/13/20   Biagio Borg, MD  docusate sodium (COLACE) 100 MG capsule Take 200 mg by mouth 2 (two) times daily.    [provider]  fexofenadine (ALLEGRA) 180 MG tablet Take 1 tablet (180 mg total) by mouth daily. Patient not taking: Reported on 10/06/2020 08/19/20 08/19/21  Biagio Borg, MD  fluticasone [facility]) 50 MCG/ACT nasal spray Place 2 sprays into both nostrils daily.    [provider]  guaiFENesin (MUCINEX) 600 MG 12 hr tablet Take 2 tablets (1,200 mg total) by mouth 2 (two) times daily as needed. Patient not taking:  Reported on 10/06/2020 08/19/20   Biagio Borg, MD  HYDROcodone-acetaminophen [facility]) 10-325 MG per tablet Take 1 tablet by mouth every 6 (six) hours. 09/24/12   [provider]  mupirocin nasal ointment (BACTROBAN) 2 % Place 1 application into the nose 2 (two) times daily. Use one-half of tube in each nostril twice daily for five (5) days. After application, press sides of nose together and gently massage. Patient not taking: Reported on 10/06/2020 05/19/20   Biagio Borg, MD  naproxen (NAPROSYN) 500 MG tablet TAKE 1 TABLET (500 MG TOTAL) BY MOUTH 2 (TWO) TIMES DAILY AS NEEDED FOR HEADACHE. 05/26/20   Biagio Borg, MD  omeprazole (PRILOSEC) 20 MG capsule Take 1 capsule (20 mg total) by mouth daily as needed. Patient taking differently: Take 20 mg by mouth daily. 01/07/20 01/06/21  Biagio Borg, MD  PARoxetine (PAXIL) 10 MG tablet Take 1 tablet (10 mg total) by mouth daily. Patient not taking: Reported on 10/06/2020 02/11/20   Biagio Borg, MD  [facility] injection  04/21/19   [provider]  sulfamethoxazole-trimethoprim (BACTRIM DS) 800-160 MG tablet Take 1 tablet by mouth 2 (two) times daily. 09/29/20   [provider]  SUMAtriptan (IMITREX) 100 MG tablet Take 1 tablet (100 mg total) by mouth every 2 (two) hours as needed for migraine or headache. May repeat in 2 hours if headache persists or recurs. 01/07/20   Biagio Borg, MD  tamsulosin (FLOMAX) 0.4 MG CAPS capsule TAKE 1 CAPSULE BY MOUTH EVERY DAY Patient taking differently: Take 0.4 mg by mouth daily. 08/11/20   Biagio Borg, MD     Family History  Problem Relation Age of Onset   Stomach cancer Neg Hx    Rectal cancer Neg Hx    Pancreatic cancer Neg Hx    Colon cancer Neg Hx    Colon polyps Neg Hx    Esophageal cancer Neg Hx     Social History   Socioeconomic History   Marital status: Single    Spouse name: Not on file   Number of children: Not on file   Years of education: 12   Highest education level:  Not on file  Occupational History   Occupation: DISABILITY    Employer: UNEMPLOYED  Tobacco Use   Smoking status: Never   Smokeless tobacco: Never  Vaping Use   Vaping Use: Never used  Substance and Sexual Activity   Alcohol use: Yes    Alcohol/week: 2.0 standard drinks    Types: 2 Cans of beer per week    Comment: 2-3 beers per week   Drug use: Not Currently    Types: Marijuana   Sexual  activity: Not Currently  Other Topics Concern   Not on file  Social History Narrative   Regular exercise-no   Caffeine Use-no         Social Determinants of Health   Financial Resource Strain: Not on file  Food Insecurity: Not on file  Transportation Needs: Not on file  Physical Activity: Not on file  Stress: Not on file  Social Connections: Not on file       Review of Systems: A 12 point ROS discussed and pertinent positives are indicated in the HPI above.  All other systems are negative.  Review of Systems  Vital Signs: BP (!) 146/79 (BP Location: Right Arm)   Pulse 69   SpO2 94%   Physical Exam Directed physical exam.  The ulcer on the right 4th digit has lost the eschar, with wound isolated to the tip of the digit.  There is granulation tissue at the wound, without redness or purulent drainage.  There is some clear drainage present.   He has doppler positive right radial and ulnar pulses.  The doppler signal of the palmar pulse is blunted compared to the left.  Doppler + signal of the left radial and ulnar pulses, with multi-phasic signal of the left palmar pulse.   The CFA access site is well healed with no palpable lump, and no sign of infection.     Imaging: IR Angiogram Extremity Right  Result Date: 10/09/2020 INDICATION: 60 year old male with a history of right fourth finger digital ulcer EXAM: ULTRASOUND-GUIDED ACCESS RIGHT COMMON FEMORAL ARTERY RIGHT UPPER EXTREMITY ANGIOGRAM HEMOSTASIS DEVICE DEPLOYMENT MEDICATIONS: 100 mcg intra arterial nitroglycerin.  ANESTHESIA/SEDATION: Moderate (conscious) sedation was employed during this procedure. A total of Versed 0 mg and Fentanyl 25 mcg was administered intravenously. Moderate Sedation Time: 0 minutes. The patient's level of consciousness and vital signs were monitored continuously by radiology nursing throughout the procedure under my direct supervision. CONTRAST:  180m VISIPAQUE IODIXANOL 320 MG/ML IV SOLN FLUOROSCOPY TIME:  Fluoroscopy Time: 7 minutes 36 seconds (13 mGy). COMPLICATIONS: None PROCEDURE: Informed consent was obtained from the patient following explanation of the procedure, risks, benefits and alternatives. The patient understands, agrees and consents for the procedure. All questions were addressed. A time out was performed prior to the initiation of the procedure. Maximal barrier sterile technique utilized including caps, mask, sterile gowns, sterile gloves, large sterile drape, hand hygiene, and Betadine prep. Ultrasound survey of the right inguinal region was performed with images stored and sent to PACs, confirming patency of the vessel. A micropuncture needle was used access the right common femoral artery under ultrasound. With excellent arterial blood flow returned, and an .018 micro wire was passed through the needle, observed enter the abdominal aorta under fluoroscopy. The needle was removed, and a micropuncture sheath was placed over the wire. The inner dilator and wire were removed, and an 035 Bentson wire was advanced under fluoroscopy into the abdominal aorta. The sheath was removed and a standard 5 [place] sheath was placed. The dilator was removed and the sheath was flushed. JB 1 catheter was advanced on the Bentson wire to the proximal descending thoracic aorta. The wire was removed and a double flush was performed. The catheter was then used to engage the innominate artery. Angiogram was performed Glidewire was then used to navigate the wire to the distal right subclavian  artery. Angiogram was performed. Glidewire was then used to navigate the catheter into the axillary artery. Angiogram was then performed through the remainder of  the right upper extremity. Angiogram of the hand arteries performed. 100 mcg nitroglycerin was then administered, with 1 minutes observed and then repeat angiogram performed. With the catheter position in the proximal brachial artery/distal axillary artery, an 014 whisper wire was advanced under fluoroscopy to the distal brachial artery. [place] Opti-cross intravascular ultrasound device was passed over the whisper wire to the aortic arch. At this point, we discovered there was malfunction with the Opti cross catheter, with some redundancy in the aortic arch with the wire and catheter combination. Upon withdrawal of the whisper wire and the intravascular ultrasound device, the malfunction of the catheter persisted. Ultimately no intravascular ultrasound was performed. The ultrasound and whisper wire were removed completely from the system. A CELT device was then performed for hemostasis through 5 French sheath. Patient tolerated the procedure well and remained hemodynamically stable throughout. No complications were encountered and no significant blood loss. FINDINGS: Ultrasound demonstrates no significant plaque at the right common femoral artery with no evidence of high bifurcation. Angiogram of the innominate artery demonstrates no significant tortuosity with normal course and caliber. No significant atherosclerotic plaque of the innominate artery or right common carotid artery. Right vertebral artery, internal mammary artery, thyrocervical trunk and costo cervical trunk are patent. Angiogram of subclavian artery demonstrates normal course caliber and contour without atherosclerotic plaque. Axillary artery of normal course caliber and contour. Patent circumflex humeral artery and lateral thoracic artery. No atherosclerotic plaque of the  axillary artery. Angiogram of the brachial artery demonstrates normal course caliber and contour. No atherosclerotic plaque Brachial artery divides into radial artery and ulnar artery in the antecubital region at the elbow. Interosseous artery has an origin from the proximal ulnar artery. Angiogram of ulnar artery and radial artery demonstrate normal course caliber and contour with no atherosclerotic plaque. Radial artery is patent to the hand. Normal course caliber and contour with no aneurysm/ectasia. Ulnar artery patent to the hand. No atherosclerotic plaque. No ectasias/aneurysm. Hand: No complete palmar arch identified, with incomplete deep palmar arch and no superficial palmar arch. Attenuated metacarpal arteries, which are essentially noncontributory secondary to small caliber. Radio dorsal digital artery of the thumb is patent, with normal course caliber and contour. Beyond the MCP, there is attenuation of the digital arteries of the first digit, with tortuous course and decreased caliber. The ulnar digital artery is occluded, with circuitous perfusion via the patent radial digital artery. The radial digital artery of the second digit is significantly attenuated, with tortuosity in the proximal segment. Although the caliber of the ulnar digital artery of the second digit is larger with less circuitous course, there is acute cut off at the distal phalanx of the ulnar digital artery with collateral flow filling the distal radial digital artery. Radial digital artery of the third digit is larger than the ulnar artery, with circuitous course and occlusion at the distal segment near the D IP. The ulnar digital artery is of normal course though decreased caliber, significantly attenuated distally. Radial digital artery of the fourth digit is of relatively larger caliber than the ulnar digital artery with mild tortuosity. The radial digital artery perfuses the distal fourth digit, relative hyper perfusion distally  at the site of the ulcer. The ulnar digital artery of the fourth digit is of decreased caliber, with occlusion at the level of the PIP. Perfusion into the attenuated distal radial digital artery is via collateral flow from the opposing digital artery. Both the radial and ulnar digital arteries of the fifth digit are  significantly attenuated in caliber, with no significant perfusion past the mid segment before administration of nitroglycerin. After nitroglycerin, there is slight improvement into the distal segment at the D IP. IMPRESSION: Status post ultrasound guided access right common femoral artery for right upper extremity angiogram. Findings reveal no significant atherosclerotic changes, with no evidence to suggest atheroembolic phenomenon as the source of the fourth digit ulcer, as the arteries are of normal course caliber and contour to the wrist. The angiographic findings of the right hand are most likely a manifestation of either Buerger's disease (thrombo angiitis obliterans) or other connective tissue disorder/rheumatologic disorder. There was a mild improvement with administration of nitroglycerin/vasodilators. Signed, Dulcy Fanny. Dellia Nims, RPVI [facility] Radiology Electronically Signed   By: Corrie Mckusick D.O.   On: 10/09/2020 17:21   US ARTERIAL SEG MULTIPLE LE (ABI, SEGMENTAL PRESSURES, PVR'S)  Result Date: 10/05/2020 CLINICAL DATA:  History of left great toe wound EXAM: NONINVASIVE PHYSIOLOGIC VASCULAR STUDY OF BILATERAL LOWER EXTREMITIES TECHNIQUE: Non-invasive vascular evaluation of both lower extremities was performed at rest, including calculation of ankle-brachial indices, multiple segmental pressure evaluation, segmental Doppler and segmental pulse volume recording. COMPARISON:  None. FINDINGS: Right Lower Extremity Resting ABI:  1.33 Segmental Pressures: Normal segmental pressures, no significant (20 mmHg) pressure gradient between adjacent  segments. Arterial Waveforms: Normal tri-phasic arterial waveforms. PVRs: Markedly blunted digital waveforms. Left Lower Extremity: Resting ABI: 1.1 Segmental Pressures: Normal segmental pressures, no significant (20 mmHg) pressure gradient between adjacent segments. Arterial Waveforms: Normal tri-phasic arterial waveforms. PVRs: Markedly blunted digital waveforms. Other: Symmetric upper extremity pressures. Ankle Brachial index > 1.4 Non diagnostic secondary to incompressible vessel calcifications 1.0-1.4       Normal 0.9-0.99     Borderline PAD 0.8-0.89     Mild PAD 0.5-0.79     Moderate PAD < 0.5          Severe PAD Toe Brachial Index Normal     >0.65 Moderate  0.53-0.64 Severe     <0.23 Toe Pressures Absolute toe pressure >10mHg sufficient for wound healing. Toe pressures <545mg = critical limb ischemia. IMPRESSION: 1. Normal bilateral resting ankle-brachial indices. 2. Markedly blunted digital waveforms bilaterally suggests small vessels disease. Electronically Signed   By: HeJacqulynn Cadet.D.   On: 10/05/2020 13:32   IR USKoreauide Vasc Access Right  Result Date: 10/09/2020 INDICATION: [age]ear old male with a history of right fourth finger digital ulcer EXAM: ULTRASOUND-GUIDED ACCESS RIGHT COMMON FEMORAL ARTERY RIGHT UPPER EXTREMITY ANGIOGRAM HEMOSTASIS DEVICE DEPLOYMENT MEDICATIONS: 100 mcg intra arterial nitroglycerin. ANESTHESIA/SEDATION: Moderate (conscious) sedation was employed during this procedure. A total of Versed 0 mg and Fentanyl 25 mcg was administered intravenously. Moderate Sedation Time: 0 minutes. The patient's level of consciousness and vital signs were monitored continuously by radiology nursing throughout the procedure under my direct supervision. CONTRAST:  10040mISIPAQUE IODIXANOL 320 MG/ML IV SOLN FLUOROSCOPY TIME:  Fluoroscopy Time: 7 minutes 36 seconds (13 mGy). COMPLICATIONS: None PROCEDURE: Informed consent was obtained from the patient following explanation of the procedure,  risks, benefits and alternatives. The patient understands, agrees and consents for the procedure. All questions were addressed. A time out was performed prior to the initiation of the procedure. Maximal barrier sterile technique utilized including caps, mask, sterile gowns, sterile gloves, large sterile drape, hand hygiene, and Betadine prep. Ultrasound survey of the right inguinal region was performed with images stored and sent to PACs, confirming patency of the vessel. A micropuncture needle was used access the right  common femoral artery under ultrasound. With excellent arterial blood flow returned, and an .018 micro wire was passed through the needle, observed enter the abdominal aorta under fluoroscopy. The needle was removed, and a micropuncture sheath was placed over the wire. The inner dilator and wire were removed, and an 035 Bentson wire was advanced under fluoroscopy into the abdominal aorta. The sheath was removed and a standard 5 [place] vascular sheath was placed. The dilator was removed and the sheath was flushed. JB 1 catheter was advanced on the Bentson wire to the proximal descending thoracic aorta. The wire was removed and a double flush was performed. The catheter was then used to engage the innominate artery. Angiogram was performed Glidewire was then used to navigate the wire to the distal right subclavian artery. Angiogram was performed. Glidewire was then used to navigate the catheter into the axillary artery. Angiogram was then performed through the remainder of the right upper extremity. Angiogram of the hand arteries performed. 100 mcg nitroglycerin was then administered, with 1 minutes observed and then repeat angiogram performed. With the catheter position in the proximal brachial artery/distal axillary artery, an 014 whisper wire was advanced under fluoroscopy to the distal brachial artery. [place] Opti-cross intravascular ultrasound device was passed over the whisper wire  to the aortic arch. At this point, we discovered there was malfunction with the Opti cross catheter, with some redundancy in the aortic arch with the wire and catheter combination. Upon withdrawal of the whisper wire and the intravascular ultrasound device, the malfunction of the catheter persisted. Ultimately no intravascular ultrasound was performed. The ultrasound and whisper wire were removed completely from the system. A CELT device was then performed for hemostasis through 5 French sheath. Patient tolerated the procedure well and remained hemodynamically stable throughout. No complications were encountered and no significant blood loss. FINDINGS: Ultrasound demonstrates no significant plaque at the right common femoral artery with no evidence of high bifurcation. Angiogram of the innominate artery demonstrates no significant tortuosity with normal course and caliber. No significant atherosclerotic plaque of the innominate artery or right common carotid artery. Right vertebral artery, internal mammary artery, thyrocervical trunk and costo cervical trunk are patent. Angiogram of subclavian artery demonstrates normal course caliber and contour without atherosclerotic plaque. Axillary artery of normal course caliber and contour. Patent circumflex humeral artery and lateral thoracic artery. No atherosclerotic plaque of the axillary artery. Angiogram of the brachial artery demonstrates normal course caliber and contour. No atherosclerotic plaque Brachial artery divides into radial artery and ulnar artery in the antecubital region at the elbow. Interosseous artery has an origin from the proximal ulnar artery. Angiogram of ulnar artery and radial artery demonstrate normal course caliber and contour with no atherosclerotic plaque. Radial artery is patent to the hand. Normal course caliber and contour with no aneurysm/ectasia. Ulnar artery patent to the hand. No atherosclerotic plaque. No ectasias/aneurysm. Hand: No  complete palmar arch identified, with incomplete deep palmar arch and no superficial palmar arch. Attenuated metacarpal arteries, which are essentially noncontributory secondary to small caliber. Radio dorsal digital artery of the thumb is patent, with normal course caliber and contour. Beyond the MCP, there is attenuation of the digital arteries of the first digit, with tortuous course and decreased caliber. The ulnar digital artery is occluded, with circuitous perfusion via the patent radial digital artery. The radial digital artery of the second digit is significantly attenuated, with tortuosity in the proximal segment. Although the caliber of the ulnar digital artery of the second  digit is larger with less circuitous course, there is acute cut off at the distal phalanx of the ulnar digital artery with collateral flow filling the distal radial digital artery. Radial digital artery of the third digit is larger than the ulnar artery, with circuitous course and occlusion at the distal segment near the D IP. The ulnar digital artery is of normal course though decreased caliber, significantly attenuated distally. Radial digital artery of the fourth digit is of relatively larger caliber than the ulnar digital artery with mild tortuosity. The radial digital artery perfuses the distal fourth digit, relative hyper perfusion distally at the site of the ulcer. The ulnar digital artery of the fourth digit is of decreased caliber, with occlusion at the level of the PIP. Perfusion into the attenuated distal radial digital artery is via collateral flow from the opposing digital artery. Both the radial and ulnar digital arteries of the fifth digit are significantly attenuated in caliber, with no significant perfusion past the mid segment before administration of nitroglycerin. After nitroglycerin, there is slight improvement into the distal segment at the D IP. IMPRESSION: Status post ultrasound guided access right common  femoral artery for right upper extremity angiogram. Findings reveal no significant atherosclerotic changes, with no evidence to suggest atheroembolic phenomenon as the source of the fourth digit ulcer, as the arteries are of normal course caliber and contour to the wrist. The angiographic findings of the right hand are most likely a manifestation of either Buerger's disease (thrombo angiitis obliterans) or other connective tissue disorder/rheumatologic disorder. There was a mild improvement with administration of nitroglycerin/vasodilators. Signed, Dulcy Fanny. Dellia Nims, RPVI [facility] Radiology Electronically Signed   By: Corrie Mckusick D.O.   On: 10/09/2020 17:21   US ARTERIAL SEG MULTIPLE (UE,WBI,SEG PRESSURES,PVR,ARMS)  Result Date: 09/30/2020 CLINICAL DATA:  60 year old male with history of right fourth digit ulcer. EXAM: BILATERAL UPPER EXTREMITY ARTERIAL DUPLEX SCAN TECHNIQUE: Gray-scale sonography as well as color Doppler and duplex ultrasound was performed to evaluate the arteries of both upper extremities. COMPARISON:  None. FINDINGS: Right wrist brachial index = 1.20 Left wrist brachial index = 1.23 Normal arterial waveforms throughout the bilateral upper extremities. Normal PVR waveforms throughout. No significant pressure gradient. IMPRESSION: Normal bilateral wrist brachial indices. No evidence of flow-limiting stenosis in the bilateral upper extremity arteries. Ruthann Cancer, MD [facility] Electronically Signed   By: Ruthann Cancer MD   On: 09/30/2020 12:49   IR INTRAVASCULAR ULTRASOUND NON CORONARY  INDICATION: 60 year old male with a history of right fourth finger digital ulcer   EXAM: ULTRASOUND-GUIDED ACCESS RIGHT COMMON FEMORAL ARTERY   RIGHT UPPER EXTREMITY ANGIOGRAM   HEMOSTASIS DEVICE DEPLOYMENT   MEDICATIONS: 100 mcg intra arterial nitroglycerin.   ANESTHESIA/SEDATION: Moderate  (conscious) sedation was employed during this procedure. A total of Versed 0 mg and Fentanyl 25 mcg was administered intravenously.   Moderate Sedation Time: 0 minutes. The patient's level of consciousness and vital signs were monitored continuously by radiology nursing throughout the procedure under my direct supervision.   CONTRAST:  169m VISIPAQUE IODIXANOL 320 MG/ML IV SOLN   FLUOROSCOPY TIME:  Fluoroscopy Time: 7 minutes 36 seconds (13 mGy).   COMPLICATIONS: None   PROCEDURE: Informed consent was obtained from the patient following explanation of the procedure, risks, benefits and alternatives. The patient understands, agrees and consents for the procedure. All questions were addressed. A time out was performed prior to the initiation of the procedure. Maximal barrier sterile  technique utilized including caps, mask, sterile gowns, sterile gloves, large sterile drape, hand hygiene, and Betadine prep.   Ultrasound survey of the right inguinal region was performed with images stored and sent to PACs, confirming patency of the vessel.   A micropuncture needle was used access the right common femoral artery under ultrasound. With excellent arterial blood flow returned, and an .018 micro wire was passed through the needle, observed enter the abdominal aorta under fluoroscopy. The needle was removed, and a micropuncture sheath was placed over the wire. The inner dilator and wire were removed, and an 035 Bentson wire was advanced under fluoroscopy into the abdominal aorta. The sheath was removed and a standard 5 [place] vascular sheath was placed. The dilator was removed and the sheath was flushed.   JB 1 catheter was advanced on the Bentson wire to the proximal descending thoracic aorta. The wire was removed and a double flush was performed.   The catheter was then used to engage the innominate artery.   Angiogram was performed   Glidewire was then used to navigate the wire to the distal right subclavian artery.    Angiogram was performed.   Glidewire was then used to navigate the catheter into the axillary artery.   Angiogram was then performed through the remainder of the right upper extremity.   Angiogram of the hand arteries performed. 100 mcg nitroglycerin was then administered, with 1 minutes observed and then repeat angiogram performed.   With the catheter position in the proximal brachial artery/distal axillary artery, an 014 whisper wire was advanced under fluoroscopy to the distal brachial artery.   [employer] Opti-cross intravascular ultrasound device was passed over the whisper wire to the aortic arch. At this point, we discovered there was malfunction with the Opti cross catheter, with some redundancy in the aortic arch with the wire and catheter combination. Upon withdrawal of the whisper wire and the intravascular ultrasound device, the malfunction of the catheter persisted. Ultimately no intravascular ultrasound was performed.   The ultrasound and whisper wire were removed completely from the system.   A CELT device was then performed for hemostasis through 5 French sheath.   Patient tolerated the procedure well and remained hemodynamically stable throughout.   No complications were encountered and no significant blood loss.   FINDINGS: Ultrasound demonstrates no significant plaque at the right common femoral artery with no evidence of high bifurcation.   Angiogram of the innominate artery demonstrates no significant tortuosity with normal course and caliber. No significant atherosclerotic plaque of the innominate artery or right common carotid artery. Right vertebral artery, internal mammary artery, thyrocervical trunk and costo cervical trunk are patent.   Angiogram of subclavian artery demonstrates normal course caliber and contour without atherosclerotic plaque.   Axillary artery of normal course caliber and contour. Patent circumflex humeral artery and lateral thoracic artery. No atherosclerotic  plaque of the axillary artery.   Angiogram of the brachial artery demonstrates normal course caliber and contour. No atherosclerotic plaque   Brachial artery divides into radial artery and ulnar artery in the antecubital region at the elbow. Interosseous artery has an origin from the proximal ulnar artery.   Angiogram of ulnar artery and radial artery demonstrate normal course caliber and contour with no atherosclerotic plaque.   Radial artery is patent to the hand. Normal course caliber and contour with no aneurysm/ectasia.   Ulnar artery patent to the hand. No atherosclerotic plaque. No ectasias/aneurysm.   Hand:   No complete palmar arch  identified, with incomplete deep palmar arch and no superficial palmar arch.   Attenuated metacarpal arteries, which are essentially noncontributory secondary to small caliber.   Radio dorsal digital artery of the thumb is patent, with normal course caliber and contour. Beyond the MCP, there is attenuation of the digital arteries of the first digit, with tortuous course and decreased caliber. The ulnar digital artery is occluded, with circuitous perfusion via the patent radial digital artery.   The radial digital artery of the second digit is significantly attenuated, with tortuosity in the proximal segment.   Although the caliber of the ulnar digital artery of the second digit is larger with less circuitous course, there is acute cut off at the distal phalanx of the ulnar digital artery with collateral flow filling the distal radial digital artery.   Radial digital artery of the third digit is larger than the ulnar artery, with circuitous course and occlusion at the distal segment near the D IP.   The ulnar digital artery is of normal course though decreased caliber, significantly attenuated distally.   Radial digital artery of the fourth digit is of relatively larger caliber than the ulnar digital artery with mild tortuosity. The radial digital artery perfuses the distal fourth  digit, relative hyper perfusion distally at the site of the ulcer.   The ulnar digital artery of the fourth digit is of decreased caliber, with occlusion at the level of the PIP. Perfusion into the attenuated distal radial digital artery is via collateral flow from the opposing digital artery.   Both the radial and ulnar digital arteries of the fifth digit are significantly attenuated in caliber, with no significant perfusion past the mid segment before administration of nitroglycerin. After nitroglycerin, there is slight improvement into the distal segment at the D IP.   IMPRESSION: Status post ultrasound guided access right common femoral artery for right upper extremity angiogram.   Findings reveal no significant atherosclerotic changes, with no evidence to suggest atheroembolic phenomenon as the source of the fourth digit ulcer, as the arteries are of normal course caliber and contour to the wrist.   The angiographic findings of the right hand are most likely a manifestation of either Buerger's disease (thrombo angiitis obliterans) or other connective tissue disorder/rheumatologic disorder. There was a mild improvement with administration of nitroglycerin/vasodilators.   Signed,   Dulcy Fanny. Dellia Nims, RPVI   [facility]   [facility] Radiology     Electronically Signed   By: Corrie Mckusick D.O.   On: 10/09/2020 17:21    IR Radiologist Eval & Mgmt  Result Date: 10/27/2020 Please refer to notes tab for details about interventional procedure. (Op Note)   Labs:  CBC: Recent Labs    05/19/20 1443 10/09/20 0830  WBC 5.8 3.9*  HGB 16.3 14.9  HCT 48.3 43.7  PLT 169.0 237    COAGS: Recent Labs    10/09/20 0830  INR 1.0    BMP: Recent Labs    05/19/20 1443 10/09/20 0830  NA 140 135  K 4.3 4.2  CL 100 104  CO2 34* 22  GLUCOSE 67* 92  BUN 22 18  CALCIUM 9.6 9.2  CREATININE 0.98 1.64*  GFRNONAA  --  48*    LIVER FUNCTION TESTS: Recent Labs     05/19/20 1443  BILITOT 1.0  AST 14  ALT 14  ALKPHOS 74  PROT 6.6  ALBUMIN 4.2    TUMOR MARKERS: No results for input(s): AFPTM, CEA, CA199, CHROMGRNA in  the last 8760 hours.  Assessment and Plan:   Mr Stocking is very pleasant 60 yo male with right fourth digital ulcer at the tip of the digit.     He has undergone both upper extremity non-invasive testing and RUE angiogram.    Non-invasive testing of the upper and lower extremity shows normal ABI bilateral, and normal wrist/brachial indices bilateral, however, the segmental exam of lower extremity shows significant changes of the pedal vasculature.    The angiogram right UE shows normal UE segments to the wrist with evidence of vasculitis of the right hand.    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    Buerger's is possible, however he denies any recent smoking/marijuana use that would be the typical risk factor.  Also possible would be periodontal disease as a risk factor.  Right upper extremity athero-embolic disease has been essentially ruled out.  Other causes of vasculitis should be considered at this point.    I reviewed the angiogram with Mr Epp, and explained that there is evidence of vasculitis.  I did let him know that I think a work-up with Rheumatology is indicated at this point.    I have discussed his case with Dr. Burney Gauze and previously with Dr. Jenny Reichmann.   I did encourage him to follow up on schedule with upcoming appointments. I let him know that we will not set a follow up for him at this time, but are happy to see him back if needed for additional needs.      Electronically Signed: Corrie Mckusick 10/27/2020, 4:23 PM   I spent a total of    25 Minutes in face to face in clinical consultation, greater than 50% of which was counseling/coordinating care for right fourth digit ulcer, possible vasculitis, SP right upper  extremity angiogram

## 2020-10-27 NOTE — Patient Outreach (Signed)
[facility] Care Management  10/27/2020  Danny Hopkins 1961/01/07 [id number]   [facility] outreach to MD referred patient for follow up  Danny Hopkins was referred to [facility] on 10/07/20 by MD referral - Dr Harlan Stains  Referral Reason: for [facility] services Insurance: [name]  Last 6 months admissions/ED visits 10/10/20 fecal impaction in rectum    Danny Hopkins was successful reached initially on 10/09/20 to completed his initial assessment  Outreach attempt #2 to the home number today initially unsuccessful No answer. [facility] RN CM left HIPAA [facility] Portability and Accountability Act) compliant voicemail message along with CM's contact info.   Danny Hopkins returned a call to RN CM shortly after a voice message was left Patient is able to verify HIPAA ([place]) identifiers Reviewed and addressed the purpose of the follow up call with the patient  Consent: [facility] RN CM reviewed [facility] services with patient. Patient gave verbal consent for services.  Assessment Danny Hopkins confirms he recently arrived home from a procedure for his right 4th Finger/vasculitis of the wrist and hand He asked 2 times for clarity who RN CM was RN CM reviewed with him the 10/09/20 outreach with him as he reports having various calls during the day   vasculitis of the wrist and hand He confirms he has appointments on 11/02/20 & 11/10/20   Bowel elimination with 10/10/20 ED visit Improved and resolved  This is not reported to be a chronic issue per pt  Believe med form procedure caused constipation    [facility] progression discussed  He agreed to further outreach in 30 business days  Patient Active Problem List   Diagnosis Date Noted   Allergic rhinitis 08/23/2020   Acute dysfunction of right eustachian tube 08/23/2020   Dysphagia 06/10/2020   Sore in nose 05/21/2020   Weight loss 05/19/2020   Left arm pain  04/19/2020   Migraines 01/07/2020   Depression 08/23/2019   Spasticity 05/29/2019   Vitamin D deficiency 08/25/2018   Laceration of left hand 09/27/2017   Acute pharyngitis 07/14/2017   Chronic tonsillitis 07/12/2017   Chronic pain syndrome 06/22/2017   Bradycardia 06/22/2017   Abnormal ECG 06/22/2017   Former smoker 06/21/2016   Scalp pain 07/30/2015   Muscle spasticity 06/24/2015   Dandruff 06/24/2015   Lymphadenitis 06/24/2015   Burning with urination 01/25/2015   Balanitis 01/25/2015   Sleeping difficulties 02/04/2014   Bladder neck obstruction 02/04/2014   Hyperlipidemia 02/04/2014   Anterior spinal artery compression syndrome 10/10/2013   Other malaise and fatigue 07/24/2013   Stroke ([place]) 07/24/2013   Encounter for well adult exam with abnormal findings 07/24/2013   Neurologic gait disorder 07/24/2013   Displacement of lumbar intervertebral disc without myelopathy 05/29/2013   Cervical spondylosis with myelopathy 01/30/2013   Insomnia 10/12/2012   Generalized anxiety disorder 10/12/2012   Eczema 10/12/2012   History of stroke 10/12/2012   Corn or callus 07/07/2010   ED (erectile dysfunction) of organic origin 07/07/2010   Neurogenic bladder 06/10/2010   Unspecified urinary incontinence 06/10/2010   Hemiplegia ([place]) 06/09/2010    Plan: [facility] RN CM scheduled this patient for another call attempt within 30 business days    Aly Hauser L. [name], RN, BSN, [place] Coordinator Office number [phone] Mobile number [phone]  Main [facility] number [phone] Fax number [phone]

## 2020-11-01 NOTE — Progress Notes (Signed)
 Office Visit Note  Patient: Danny Hopkins             Date of Birth: 05/05/1960           MRN: [id number]             PCP: White, Cynthia, MD Referring: John, James W, MD Visit Date: 11/02/2020  Subjective:  New Patient (Initial Visit) (Patient complains of painful  RRF wound, which still bothers him but has improved. )   History of Present Illness: Danny Hopkins is a 60 y.o. male here for evaluation for vasculitis with digital ulcer of right 4th finger. He has felt in overall usual health but had bene noticing cold and numbness changes in his fingertips for months affecting the right 3-5th fingers. He developed a wound on the tip of his 4th finger in June he does not remember a particular injury or maybe a small abrasion starting this. He did not think it was severe but subsequently developed black discoloration of his whole fingertip peeling of the skin. He saw Dr. Weingold for evaluation and subsequent upper extremity arteriography with findings of no patent palmar arch and diminished digital artery perfusions with normal proximal vasculature up to the wrist. Otherwise he denies feeling other significant symptom or medical changes lately. No skin rashes, lesions, no discoloration or ulceration of other digits or in the past. He has chronic lumbar spine degenerative arthritis with radiculopathy. He has chronic left sided residual weakness due to previous stroke. Workup for the stroke reportedly without any specific secondary underlying cause identified.  Labs reviewed 09/2020 CBC wnl BMP eGFR 48 UA contaminated, +Ca Oxalate, +glucose  Imaging reviewed 10/09/20 RUE arteriogram IMPRESSION:  Status post ultrasound guided access right common femoral artery for right upper extremity angiogram.  Findings reveal no significant atherosclerotic changes, with no evidence to suggest atheroembolic phenomenon as the source of the fourth digit ulcer, as the arteries are of normal course  caliber and contour to the wrist.  The angiographic findings of the right hand are most likely a manifestation of either Buerger's disease (thrombo angiitis obliterans) or other connective tissue disorder/rheumatologic disorder. There was a mild improvement with administration of  nitroglycerin/vasodilators.  10/05/20 LE Vascular ultrasound IMPRESSION: 1. Normal bilateral resting ankle-brachial indices. 2. Markedly blunted digital waveforms bilaterally suggests small vessels disease.  06/23/20 CT Chest/Abdomen/Pelvis IMPRESSION: No significant abnormality is noted in the chest, abdomen or pelvis.  Activities of Daily Living:  Patient reports morning stiffness for 1-60 hours.   Patient Denies nocturnal pain.  Difficulty dressing/grooming: Denies Difficulty climbing stairs: Reports Difficulty getting out of chair: Denies Difficulty using hands for taps, buttons, cutlery, and/or writing: Reports  Review of Systems  Constitutional:  Positive for fatigue.  HENT:  Negative for mouth sores, mouth dryness and nose dryness.   Eyes:  Negative for pain, itching, visual disturbance and dryness.  Respiratory:  Negative for cough, hemoptysis, shortness of breath and difficulty breathing.   Cardiovascular:  Negative for chest pain, palpitations and swelling in legs/feet.  Gastrointestinal:  Positive for blood in stool and constipation. Negative for abdominal pain and diarrhea.  Endocrine: Negative for increased urination.  Genitourinary:  Negative for painful urination.  Musculoskeletal:  Positive for morning stiffness. Negative for joint pain, joint pain, joint swelling, myalgias, muscle weakness, muscle tenderness and myalgias.  Skin:  Negative for color change, rash and redness.  Allergic/Immunologic: Negative for susceptible to infections.  Neurological:  Positive for headaches and weakness. Negative for dizziness, numbness and   memory loss.  Hematological:  Negative for swollen glands.   Psychiatric/Behavioral:  Negative for confusion and sleep disturbance.    PMFS History:  Patient Active Problem List   Diagnosis Date Noted   Vasculitis (HCC) 11/02/2020   Allergic rhinitis 08/23/2020   Acute dysfunction of right eustachian tube 08/23/2020   Dysphagia 06/10/2020   Sore in nose 05/21/2020   Weight loss 05/19/2020   Left arm pain 04/19/2020   Migraines 01/07/2020   Depression 08/23/2019   Spasticity 05/29/2019   Vitamin D deficiency 08/25/2018   Laceration of left hand 09/27/2017   Acute pharyngitis 07/14/2017   Chronic tonsillitis 07/12/2017   Chronic pain syndrome 06/22/2017   Bradycardia 06/22/2017   Abnormal ECG 06/22/2017   Former smoker 06/21/2016   Scalp pain 07/30/2015   Muscle spasticity 06/24/2015   Dandruff 06/24/2015   Lymphadenitis 06/24/2015   Burning with urination 01/25/2015   Balanitis 01/25/2015   Sleeping difficulties 02/04/2014   Bladder neck obstruction 02/04/2014   Hyperlipidemia 02/04/2014   Anterior spinal artery compression syndrome 10/10/2013   Other malaise and fatigue 07/24/2013   Stroke (HCC) 07/24/2013   Encounter for well adult exam with abnormal findings 07/24/2013   Neurologic gait disorder 07/24/2013   Displacement of lumbar intervertebral disc without myelopathy 05/29/2013   Cervical spondylosis with myelopathy 01/30/2013   Insomnia 10/12/2012   Generalized anxiety disorder 10/12/2012   Eczema 10/12/2012   History of stroke 10/12/2012   Corn or callus 07/07/2010   ED (erectile dysfunction) of organic origin 07/07/2010   Neurogenic bladder 06/10/2010   Unspecified urinary incontinence 06/10/2010   Hemiplegia (HCC) 06/09/2010    Past Medical History:  Diagnosis Date   Anxiety    07/2019   Arthritis    Depression 08/23/2019   History of chicken pox    Hyperlipidemia    2015   Neurologic gait disorder 07/24/2013   Chronic post stroke   Stroke (HCC)    2006    Family History  Problem Relation Age of Onset    Healthy Sister    Healthy Sister    Healthy Brother    Healthy Brother    Stomach cancer Neg Hx    Rectal cancer Neg Hx    Pancreatic cancer Neg Hx    Colon cancer Neg Hx    Colon polyps Neg Hx    Esophageal cancer Neg Hx    Past Surgical History:  Procedure Laterality Date   cervical staph infection     2006   COLONOSCOPY     2015   INGUINAL HERNIA REPAIR     kindergarten   IR ANGIOGRAM EXTREMITY RIGHT  10/09/2020   IR INTRAVASCULAR ULTRASOUND NON CORONARY  10/13/2020   IR RADIOLOGIST EVAL & MGMT  09/24/2020   IR RADIOLOGIST EVAL & MGMT  10/27/2020   IR US GUIDE VASC ACCESS RIGHT  10/09/2020   Social History   Social History Narrative   Regular exercise-no   Caffeine Use-no         Immunization History  Administered Date(s) Administered   Influenza Inj Mdck Quad Pf 12/09/2017   Influenza,inj,Quad PF,6+ Mos 02/04/2014, 01/20/2015, 12/23/2015, 12/13/2016, 12/10/2018   Influenza-Unspecified 03/29/2011, 02/02/2012, 03/28/2018, 12/30/2019   PFIZER(Purple Top)SARS-COV-2 Vaccination 06/25/2019, 07/16/2019, 03/11/2020   Td 12/23/2015   Tdap 03/28/2009, 09/20/2017, 10/29/2018   Zoster Recombinat (Shingrix) 10/29/2018, 04/21/2019     Objective: Vital Signs: BP (!) 96/58 (BP Location: Left Arm, Patient Position: Sitting, Cuff Size: Normal) Comment: Manual  Pulse 80   Ht 5'   7" (1.702 m)   Wt 128 lb 3.2 oz (58.2 kg)   BMI 20.08 kg/m    Physical Exam Constitutional:      Comments: Chronically ill appearing  HENT:     Mouth/Throat:     Mouth: Mucous membranes are moist.     Pharynx: Oropharynx is clear.  Eyes:     Conjunctiva/sclera: Conjunctivae normal.  Cardiovascular:     Rate and Rhythm: Normal rate and regular rhythm.  Pulmonary:     Effort: Pulmonary effort is normal.     Breath sounds: Normal breath sounds.  Skin:    General: Skin is warm and dry.     Findings: No rash.     Comments: <1cm diameter ulcer at tip of right 4th digit no purulence no induration,  tenderness and mild swelling present Dusky coloration of right 4th-5th fingertips Nailfold capillaries appear decreased in number  Neurological:     Mental Status: He is alert.     Comments: Left side strength 4/5 and decreased dexterity, walking with cane  Psychiatric:        Mood and Affect: Mood normal.     Musculoskeletal Exam:  Left shoulder decreased ROM abduction and extension no swelling or tenderness Elbows full ROM no tenderness or swelling Wrists full ROM no tenderness or swelling Fingers full ROM no tenderness or swelling Knees full ROM no tenderness or swelling, patellofemoral crepitus present   Investigation: No additional findings.  Imaging: IR Angiogram Extremity Right  Result Date: 10/09/2020 INDICATION: 60-year-old male with a history of right fourth finger digital ulcer EXAM: ULTRASOUND-GUIDED ACCESS RIGHT COMMON FEMORAL ARTERY RIGHT UPPER EXTREMITY ANGIOGRAM HEMOSTASIS DEVICE DEPLOYMENT MEDICATIONS: 100 mcg intra arterial nitroglycerin. ANESTHESIA/SEDATION: Moderate (conscious) sedation was employed during this procedure. A total of Versed 0 mg and Fentanyl 25 mcg was administered intravenously. Moderate Sedation Time: 0 minutes. The patient's level of consciousness and vital signs were monitored continuously by radiology nursing throughout the procedure under my direct supervision. CONTRAST:  100mL VISIPAQUE IODIXANOL 320 MG/ML IV SOLN FLUOROSCOPY TIME:  Fluoroscopy Time: 7 minutes 36 seconds (13 mGy). COMPLICATIONS: None PROCEDURE: Informed consent was obtained from the patient following explanation of the procedure, risks, benefits and alternatives. The patient understands, agrees and consents for the procedure. All questions were addressed. A time out was performed prior to the initiation of the procedure. Maximal barrier sterile technique utilized including caps, mask, sterile gowns, sterile gloves, large sterile drape, hand hygiene, and Betadine prep. Ultrasound  survey of the right inguinal region was performed with images stored and sent to PACs, confirming patency of the vessel. A micropuncture needle was used access the right common femoral artery under ultrasound. With excellent arterial blood flow returned, and an .018 micro wire was passed through the needle, observed enter the abdominal aorta under fluoroscopy. The needle was removed, and a micropuncture sheath was placed over the wire. The inner dilator and wire were removed, and an 035 Bentson wire was advanced under fluoroscopy into the abdominal aorta. The sheath was removed and a standard 5 French vascular sheath was placed. The dilator was removed and the sheath was flushed. JB 1 catheter was advanced on the Bentson wire to the proximal descending thoracic aorta. The wire was removed and a double flush was performed. The catheter was then used to engage the innominate artery. Angiogram was performed Glidewire was then used to navigate the wire to the distal right subclavian artery. Angiogram was performed. Glidewire was then used to navigate the catheter into the   axillary artery. Angiogram was then performed through the remainder of the right upper extremity. Angiogram of the hand arteries performed. 100 mcg nitroglycerin was then administered, with 1 minutes observed and then repeat angiogram performed. With the catheter position in the proximal brachial artery/distal axillary artery, an 014 whisper wire was advanced under fluoroscopy to the distal brachial artery. [place] Opti-cross intravascular ultrasound device was passed over the whisper wire to the aortic arch. At this point, we discovered there was malfunction with the Opti cross catheter, with some redundancy in the aortic arch with the wire and catheter combination. Upon withdrawal of the whisper wire and the intravascular ultrasound device, the malfunction of the catheter persisted. Ultimately no intravascular ultrasound was performed.  The ultrasound and whisper wire were removed completely from the system. A CELT device was then performed for hemostasis through 5 French sheath. Patient tolerated the procedure well and remained hemodynamically stable throughout. No complications were encountered and no significant blood loss. FINDINGS: Ultrasound demonstrates no significant plaque at the right common femoral artery with no evidence of high bifurcation. Angiogram of the innominate artery demonstrates no significant tortuosity with normal course and caliber. No significant atherosclerotic plaque of the innominate artery or right common carotid artery. Right vertebral artery, internal mammary artery, thyrocervical trunk and costo cervical trunk are patent. Angiogram of subclavian artery demonstrates normal course caliber and contour without atherosclerotic plaque. Axillary artery of normal course caliber and contour. Patent circumflex humeral artery and lateral thoracic artery. No atherosclerotic plaque of the axillary artery. Angiogram of the brachial artery demonstrates normal course caliber and contour. No atherosclerotic plaque Brachial artery divides into radial artery and ulnar artery in the antecubital region at the elbow. Interosseous artery has an origin from the proximal ulnar artery. Angiogram of ulnar artery and radial artery demonstrate normal course caliber and contour with no atherosclerotic plaque. Radial artery is patent to the hand. Normal course caliber and contour with no aneurysm/ectasia. Ulnar artery patent to the hand. No atherosclerotic plaque. No ectasias/aneurysm. Hand: No complete palmar arch identified, with incomplete deep palmar arch and no superficial palmar arch. Attenuated metacarpal arteries, which are essentially noncontributory secondary to small caliber. Radio dorsal digital artery of the thumb is patent, with normal course caliber and contour. Beyond the MCP, there is attenuation of the digital arteries of the  first digit, with tortuous course and decreased caliber. The ulnar digital artery is occluded, with circuitous perfusion via the patent radial digital artery. The radial digital artery of the second digit is significantly attenuated, with tortuosity in the proximal segment. Although the caliber of the ulnar digital artery of the second digit is larger with less circuitous course, there is acute cut off at the distal phalanx of the ulnar digital artery with collateral flow filling the distal radial digital artery. Radial digital artery of the third digit is larger than the ulnar artery, with circuitous course and occlusion at the distal segment near the D IP. The ulnar digital artery is of normal course though decreased caliber, significantly attenuated distally. Radial digital artery of the fourth digit is of relatively larger caliber than the ulnar digital artery with mild tortuosity. The radial digital artery perfuses the distal fourth digit, relative hyper perfusion distally at the site of the ulcer. The ulnar digital artery of the fourth digit is of decreased caliber, with occlusion at the level of the PIP. Perfusion into the attenuated distal radial digital artery is via collateral flow from the opposing digital artery. Both the  radial and ulnar digital arteries of the fifth digit are significantly attenuated in caliber, with no significant perfusion past the mid segment before administration of nitroglycerin. After nitroglycerin, there is slight improvement into the distal segment at the D IP. IMPRESSION: Status post ultrasound guided access right common femoral artery for right upper extremity angiogram. Findings reveal no significant atherosclerotic changes, with no evidence to suggest atheroembolic phenomenon as the source of the fourth digit ulcer, as the arteries are of normal course caliber and contour to the wrist. The angiographic findings of the right hand are most likely a manifestation of either  Buerger's disease (thrombo angiitis obliterans) or other connective tissue disorder/rheumatologic disorder. There was a mild improvement with administration of nitroglycerin/vasodilators. Signed, Dulcy Fanny. Dellia Nims, RPVI [facility] Radiology Electronically Signed   By: Corrie Mckusick D.O.   On: 10/09/2020 17:21   IR US Guide Vasc Access Right  Result Date: 10/09/2020 INDICATION: 60 year old male with a history of right fourth finger digital ulcer EXAM: ULTRASOUND-GUIDED ACCESS RIGHT COMMON FEMORAL ARTERY RIGHT UPPER EXTREMITY ANGIOGRAM HEMOSTASIS DEVICE DEPLOYMENT MEDICATIONS: 100 mcg intra arterial nitroglycerin. ANESTHESIA/SEDATION: Moderate (conscious) sedation was employed during this procedure. A total of Versed 0 mg and Fentanyl 25 mcg was administered intravenously. Moderate Sedation Time: 0 minutes. The patient's level of consciousness and vital signs were monitored continuously by radiology nursing throughout the procedure under my direct supervision. CONTRAST:  160m VISIPAQUE IODIXANOL 320 MG/ML IV SOLN FLUOROSCOPY TIME:  Fluoroscopy Time: 7 minutes 36 seconds (13 mGy). COMPLICATIONS: None PROCEDURE: Informed consent was obtained from the patient following explanation of the procedure, risks, benefits and alternatives. The patient understands, agrees and consents for the procedure. All questions were addressed. A time out was performed prior to the initiation of the procedure. Maximal barrier sterile technique utilized including caps, mask, sterile gowns, sterile gloves, large sterile drape, hand hygiene, and Betadine prep. Ultrasound survey of the right inguinal region was performed with images stored and sent to PACs, confirming patency of the vessel. A micropuncture needle was used access the right common femoral artery under ultrasound. With excellent arterial blood flow returned, and an .018 micro wire was passed through the needle, observed enter  the abdominal aorta under fluoroscopy. The needle was removed, and a micropuncture sheath was placed over the wire. The inner dilator and wire were removed, and an 035 Bentson wire was advanced under fluoroscopy into the abdominal aorta. The sheath was removed and a standard 5 [place] sheath was placed. The dilator was removed and the sheath was flushed. JB 1 catheter was advanced on the Bentson wire to the proximal descending thoracic aorta. The wire was removed and a double flush was performed. The catheter was then used to engage the innominate artery. Angiogram was performed Glidewire was then used to navigate the wire to the distal right subclavian artery. Angiogram was performed. Glidewire was then used to navigate the catheter into the axillary artery. Angiogram was then performed through the remainder of the right upper extremity. Angiogram of the hand arteries performed. 100 mcg nitroglycerin was then administered, with 1 minutes observed and then repeat angiogram performed. With the catheter position in the proximal brachial artery/distal axillary artery, an 014 whisper wire was advanced under fluoroscopy to the distal brachial artery. 0JudOpti-cross intravascular ultrasound device was passed over the whisper wire to the aortic arch. At this point, we discovered there was malfunction with the Opti cross catheter, with some redundancy in the aortic  arch with the wire and catheter combination. Upon withdrawal of the whisper wire and the intravascular ultrasound device, the malfunction of the catheter persisted. Ultimately no intravascular ultrasound was performed. The ultrasound and whisper wire were removed completely from the system. A CELT device was then performed for hemostasis through 5 French sheath. Patient tolerated the procedure well and remained hemodynamically stable throughout. No complications were encountered and no significant blood loss. FINDINGS: Ultrasound  demonstrates no significant plaque at the right common femoral artery with no evidence of high bifurcation. Angiogram of the innominate artery demonstrates no significant tortuosity with normal course and caliber. No significant atherosclerotic plaque of the innominate artery or right common carotid artery. Right vertebral artery, internal mammary artery, thyrocervical trunk and costo cervical trunk are patent. Angiogram of subclavian artery demonstrates normal course caliber and contour without atherosclerotic plaque. Axillary artery of normal course caliber and contour. Patent circumflex humeral artery and lateral thoracic artery. No atherosclerotic plaque of the axillary artery. Angiogram of the brachial artery demonstrates normal course caliber and contour. No atherosclerotic plaque Brachial artery divides into radial artery and ulnar artery in the antecubital region at the elbow. Interosseous artery has an origin from the proximal ulnar artery. Angiogram of ulnar artery and radial artery demonstrate normal course caliber and contour with no atherosclerotic plaque. Radial artery is patent to the hand. Normal course caliber and contour with no aneurysm/ectasia. Ulnar artery patent to the hand. No atherosclerotic plaque. No ectasias/aneurysm. Hand: No complete palmar arch identified, with incomplete deep palmar arch and no superficial palmar arch. Attenuated metacarpal arteries, which are essentially noncontributory secondary to small caliber. Radio dorsal digital artery of the thumb is patent, with normal course caliber and contour. Beyond the MCP, there is attenuation of the digital arteries of the first digit, with tortuous course and decreased caliber. The ulnar digital artery is occluded, with circuitous perfusion via the patent radial digital artery. The radial digital artery of the second digit is significantly attenuated, with tortuosity in the proximal segment. Although the caliber of the ulnar digital  artery of the second digit is larger with less circuitous course, there is acute cut off at the distal phalanx of the ulnar digital artery with collateral flow filling the distal radial digital artery. Radial digital artery of the third digit is larger than the ulnar artery, with circuitous course and occlusion at the distal segment near the D IP. The ulnar digital artery is of normal course though decreased caliber, significantly attenuated distally. Radial digital artery of the fourth digit is of relatively larger caliber than the ulnar digital artery with mild tortuosity. The radial digital artery perfuses the distal fourth digit, relative hyper perfusion distally at the site of the ulcer. The ulnar digital artery of the fourth digit is of decreased caliber, with occlusion at the level of the PIP. Perfusion into the attenuated distal radial digital artery is via collateral flow from the opposing digital artery. Both the radial and ulnar digital arteries of the fifth digit are significantly attenuated in caliber, with no significant perfusion past the mid segment before administration of nitroglycerin. After nitroglycerin, there is slight improvement into the distal segment at the D IP. IMPRESSION: Status post ultrasound guided access right common femoral artery for right upper extremity angiogram. Findings reveal no significant atherosclerotic changes, with no evidence to suggest atheroembolic phenomenon as the source of the fourth digit ulcer, as the arteries are of normal course caliber and contour to the wrist. The angiographic findings of the right   hand are most likely a manifestation of either Buerger's disease (thrombo angiitis obliterans) or other connective tissue disorder/rheumatologic disorder. There was a mild improvement with administration of nitroglycerin/vasodilators. Signed, Dulcy Fanny. Dellia Nims, RPVI [facility] Radiology Electronically Signed   By:  Corrie Mckusick D.O.   On: 10/09/2020 17:21   IR INTRAVASCULAR ULTRASOUND NON CORONARY  INDICATION: 60 year old male with a history of right fourth finger digital ulcer   EXAM: ULTRASOUND-GUIDED ACCESS RIGHT COMMON FEMORAL ARTERY   RIGHT UPPER EXTREMITY ANGIOGRAM   HEMOSTASIS DEVICE DEPLOYMENT   MEDICATIONS: 100 mcg intra arterial nitroglycerin.   ANESTHESIA/SEDATION: Moderate (conscious) sedation was employed during this procedure. A total of Versed 0 mg and Fentanyl 25 mcg was administered intravenously.   Moderate Sedation Time: 0 minutes. The patient's level of consciousness and vital signs were monitored continuously by radiology nursing throughout the procedure under my direct supervision.   CONTRAST:  117m VISIPAQUE IODIXANOL 320 MG/ML IV SOLN   FLUOROSCOPY TIME:  Fluoroscopy Time: 7 minutes 36 seconds (13 mGy).   COMPLICATIONS: None   PROCEDURE: Informed consent was obtained from the patient following explanation of the procedure, risks, benefits and alternatives. The patient understands, agrees and consents for the procedure. All questions were addressed. A time out was performed prior to the initiation of the procedure. Maximal barrier sterile technique utilized including caps, mask, sterile gowns, sterile gloves, large sterile drape, hand hygiene, and Betadine prep.   Ultrasound survey of the right inguinal region was performed with images stored and sent to PACs, confirming patency of the vessel.   A micropuncture needle was used access the right common femoral artery under ultrasound. With excellent arterial blood flow returned, and an .018 micro wire was passed through the needle, observed enter the abdominal aorta under fluoroscopy. The needle was removed, and a micropuncture sheath was placed over the wire. The inner dilator and wire were removed, and an 035 Bentson wire was advanced under fluoroscopy into the abdominal aorta. The sheath was removed and a standard 5 [place] sheath was  placed. The dilator was removed and the sheath was flushed.   JB 1 catheter was advanced on the Bentson wire to the proximal descending thoracic aorta. The wire was removed and a double flush was performed.   The catheter was then used to engage the innominate artery.   Angiogram was performed   Glidewire was then used to navigate the wire to the distal right subclavian artery.   Angiogram was performed.   Glidewire was then used to navigate the catheter into the axillary artery.   Angiogram was then performed through the remainder of the right upper extremity.   Angiogram of the hand arteries performed. 100 mcg nitroglycerin was then administered, with 1 minutes observed and then repeat angiogram performed.   With the catheter position in the proximal brachial artery/distal axillary artery, an 014 whisper wire was advanced under fluoroscopy to the distal brachial artery.   0DoverOpti-cross intravascular ultrasound device was passed over the whisper wire to the aortic arch. At this point, we discovered there was malfunction with the Opti cross catheter, with some redundancy in the aortic arch with the wire and catheter combination. Upon withdrawal of the whisper wire and the intravascular ultrasound device, the malfunction of the catheter persisted. Ultimately no intravascular ultrasound was performed.   The ultrasound and whisper wire were removed completely from the system.   A CELT device was then performed for hemostasis through 5  French sheath.   Patient tolerated the procedure well and remained hemodynamically stable throughout.   No complications were encountered and no significant blood loss.   FINDINGS: Ultrasound demonstrates no significant plaque at the right common femoral artery with no evidence of high bifurcation.   Angiogram of the innominate artery demonstrates no significant tortuosity with normal course and caliber. No significant atherosclerotic plaque of the innominate artery or  right common carotid artery. Right vertebral artery, internal mammary artery, thyrocervical trunk and costo cervical trunk are patent.   Angiogram of subclavian artery demonstrates normal course caliber and contour without atherosclerotic plaque.   Axillary artery of normal course caliber and contour. Patent circumflex humeral artery and lateral thoracic artery. No atherosclerotic plaque of the axillary artery.   Angiogram of the brachial artery demonstrates normal course caliber and contour. No atherosclerotic plaque   Brachial artery divides into radial artery and ulnar artery in the antecubital region at the elbow. Interosseous artery has an origin from the proximal ulnar artery.   Angiogram of ulnar artery and radial artery demonstrate normal course caliber and contour with no atherosclerotic plaque.   Radial artery is patent to the hand. Normal course caliber and contour with no aneurysm/ectasia.   Ulnar artery patent to the hand. No atherosclerotic plaque. No ectasias/aneurysm.   Hand:   No complete palmar arch identified, with incomplete deep palmar arch and no superficial palmar arch.   Attenuated metacarpal arteries, which are essentially noncontributory secondary to small caliber.   Radio dorsal digital artery of the thumb is patent, with normal course caliber and contour. Beyond the MCP, there is attenuation of the digital arteries of the first digit, with tortuous course and decreased caliber. The ulnar digital artery is occluded, with circuitous perfusion via the patent radial digital artery.   The radial digital artery of the second digit is significantly attenuated, with tortuosity in the proximal segment.   Although the caliber of the ulnar digital artery of the second digit is larger with less circuitous course, there is acute cut off at the distal phalanx of the ulnar digital artery with collateral flow filling the distal radial digital artery.   Radial digital artery of the third digit is larger  than the ulnar artery, with circuitous course and occlusion at the distal segment near the D IP.   The ulnar digital artery is of normal course though decreased caliber, significantly attenuated distally.   Radial digital artery of the fourth digit is of relatively larger caliber than the ulnar digital artery with mild tortuosity. The radial digital artery perfuses the distal fourth digit, relative hyper perfusion distally at the site of the ulcer.   The ulnar digital artery of the fourth digit is of decreased caliber, with occlusion at the level of the PIP. Perfusion into the attenuated distal radial digital artery is via collateral flow from the opposing digital artery.   Both the radial and ulnar digital arteries of the fifth digit are significantly attenuated in caliber, with no significant perfusion past the mid segment before administration of nitroglycerin. After nitroglycerin, there is slight improvement into the distal segment at the D IP.   IMPRESSION: Status post ultrasound guided access right common femoral artery for right upper extremity angiogram.   Findings reveal no significant atherosclerotic changes, with no evidence to suggest atheroembolic phenomenon as the source of the fourth digit ulcer, as the arteries are of normal course caliber and contour to the wrist.   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 There was a mild improvement with administration of nitroglycerin/vasodilators.   Signed,   Jaime S. Wagner, DO, RPVI   [facility]   [facility]     Electronically Signed   By: Jaime  Wagner D.O.   On: 10/09/2020 17:21    IR Radiologist Eval & Mgmt  Result Date: 10/27/2020 Please refer to notes tab for details about interventional procedure. (Op Note)   Recent Labs: Lab Results  Component  Value Date   WBC 3.9 (L) 10/09/2020   HGB 14.9 10/09/2020   PLT 237 10/09/2020   NA 135 10/09/2020   K 4.2 10/09/2020   CL 104 10/09/2020   CO2 22 10/09/2020   GLUCOSE 92 10/09/2020   BUN 18 10/09/2020   CREATININE 1.64 (H) 10/09/2020   BILITOT 1.0 05/19/2020   ALKPHOS 74 05/19/2020   AST 14 05/19/2020   ALT 14 05/19/2020   PROT 6.5 11/02/2020   ALBUMIN 4.2 05/19/2020   CALCIUM 9.2 10/09/2020    Speciality Comments: No specialty comments available.  Procedures:  No procedures performed Allergies: Patient has no known allergies.   Assessment / Plan:     Visit Diagnoses: Vasculitis (HCC) - Plan: Cryoglobulin, Fluorescent treponemal ab(fta)-IgG-bld, ANCA Screen Reflex Titer([facility]), Sedimentation rate, C-reactive protein, Hepatitis B core antibody, IgM, Hepatitis B surface antigen, Hepatitis C antibody, Serum protein electrophoresis with reflex, HIV Antibody (routine testing w rflx), ANA  Appears to be medium vessel involvement with deep palmar arch and small vessel disease in digital arteries and capillary changes.  No specific associated symptoms to suggest pulmonary renal syndromes or chronic infection we will check serology screening today for a variety of underlying causes.  If nothing suggestive is found would have to discuss risks versus benefits for biopsy versus trial of vasodilator treatment his blood pressure is fairly low that may be prohibitive for this.  Weight loss  Unexplained unintentional weight loss for several months could be consistent with an ongoing systemic inflammatory process.  Work-up including protein electrophoresis and chronic infection screening.  History of stroke  Previous stroke without specific identified underlying causes to indicate shared systemic problem with a more peripheral vascular disease.  Orders: Orders Placed This Encounter  Procedures   Cryoglobulin   Fluorescent treponemal ab(fta)-IgG-bld   ANCA Screen Reflex Titer([facility])    Sedimentation rate   C-reactive protein   Hepatitis B core antibody, IgM   Hepatitis B surface antigen   Hepatitis C antibody   Serum protein electrophoresis with reflex   HIV Antibody (routine testing w rflx)   ANA   ANA    No orders of the defined types were placed in this encounter.    Follow-Up Instructions: Return in about 2 weeks (around 11/16/2020).   Christopher W Rice, MD  Note - This record has been created using Dragon software.  Chart creation errors have been sought, but may not always  have been located. Such creation errors do not reflect on  the standard of medical care.  

## 2020-11-02 ENCOUNTER — Ambulatory Visit (INDEPENDENT_AMBULATORY_CARE_PROVIDER_SITE_OTHER): Payer: Medicare HMO | Admitting: Internal Medicine

## 2020-11-02 ENCOUNTER — Other Ambulatory Visit: Payer: Self-pay

## 2020-11-02 ENCOUNTER — Encounter: Payer: Self-pay | Admitting: Internal Medicine

## 2020-11-02 VITALS — BP 96/58 | HR 80 | Ht 67.0 in | Wt 128.2 lb

## 2020-11-02 DIAGNOSIS — R634 Abnormal weight loss: Secondary | ICD-10-CM

## 2020-11-02 DIAGNOSIS — Z8673 Personal history of transient ischemic attack (TIA), and cerebral infarction without residual deficits: Secondary | ICD-10-CM | POA: Diagnosis not present

## 2020-11-02 DIAGNOSIS — I776 Arteritis, unspecified: Secondary | ICD-10-CM | POA: Diagnosis not present

## 2020-11-02 NOTE — Patient Instructions (Addendum)
I am checking for multiple possible causes of vasculitis, or inflammation of the blood vessels that can cause poor circulation that can cause symptoms such as slow wound healing or ulcers, among other things.  Vasculitis  Vasculitis is inflammation of the blood vessels. With vasculitis, the blood vessels can become thick, narrow, scarred, or weak. Enough blood may not be able to flow through them. This can cause damage to the muscles, kidneys,lungs, brain, and other parts of the body. There are many types of vasculitis. The different types may affect different kinds of blood vessels or different areas of the body. Some types last only ashort time, while others last a long time. What are the causes? The exact cause of this condition is not known. However, vasculitis can develop when the body's defense system (immune system) attacks its own blood vessels. This attack can be caused by: An infection. An immune system disease, such as lupus, rheumatoid arthritis, or scleroderma. An allergic reaction to a medicine. A cancer that affects blood cells, such as leukemia or lymphoma. What increases the risk? The following factors may make you more likely to develop this condition: Being a smoker. Being under stress. Having a physical injury. What are the signs or symptoms? Symptoms of this condition depend on the type of vasculitis that you have. Symptoms that are common to all types of vasculitis include: Fever. Poor appetite. Weight loss. Feeling very tired (fatigue). Having aches and pains. Weakness. Numbness in an area of your body. Symptoms for specific types of vasculitis include: Skin problems, such as sores, spots, or rashes. Trouble seeing. Trouble breathing. Coughing up blood. Blood in your urine. Headaches. Stomach pain. Stuffy or bloody nose. How is this diagnosed? This condition may be diagnosed based on: Your symptoms. A physical exam. You may also have tests,  including: Blood tests. A urine test. A biopsy of a blood vessel. A test to measure the electrical signals moving through nerves (nerve conduction study). Imaging tests, such as: X-rays. CT scan. Ultrasound. MRI. Angiogram. How is this treated? Treatment for this condition will depend on the type of vasculitis that you have and how severe the symptoms are. Sometimes treatment is not needed. Treatment often includes: Medicines. Physical therapy or occupational therapy. This helps strengthen muscles that were weakened by the disease. You will need to see your health care provider while you are being treated. During follow-up visits, your health care provider may: Perform blood tests and bone density tests. Check your blood pressure and blood sugar. Check for side effects of any medicines you are taking. Vasculitis cannot always be cured. Sometimes symptoms go away but the disease does not (the disease goes into remission). If symptoms return, increased treatment may be needed. Follow these instructions at home: Take over-the-counter and prescription medicines only as told by your health care provider. Exercise as directed. Talk with your health care provider about what exercises are okay for you to do. Exercises that increase your heart rate (aerobic exercise), such as walking, are usually recommended. Aerobic exercise helps control your blood pressure and prevent bone loss. Follow a healthy diet. Make sure your diet includes fruits, vegetables, whole grains, and healthy sources of protein. Learn as much as you can about vasculitis, and consider joining a support group. Talk to other people who have your condition. This may help you cope with the illness. Talk with your health care provider if you feel stressed, anxious, or depressed. Keep all follow-up visits as told by your health care provider.  This is important. Contact a health care provider if: Your symptoms return or you have new  symptoms. Your fever, fatigue, headache, or weight loss gets worse. You have signs of infection, such as redness, swelling, tenderness, warmth, or a new fever. Your pain does not go away, even after you take pain medicine. Your nose bleeds. Get help right away if: Your vision gets worse. You have chest pain or stomach pain. You have trouble breathing. One side of your face or body suddenly becomes weak or numb. There is blood in your urine. Summary Vasculitis is inflammation of the blood vessels that may cause them to become thick, narrow, scarred, or weak. Enough blood may not be able to flow through them. This can cause damage throughout your body. The exact cause of this condition is not known. However, vasculitis can develop when the body's immune system attacks its own blood vessels. This attack may be caused by an infection, an immune system disease, an allergic reaction to a medicine, or a cancer that affects blood cells, such as leukemia or lymphoma. Vasculitis cannot always be cured. Sometimes symptoms go away but the disease does not (the disease goes into remission). If symptoms return, increased treatment may be needed. This information is not intended to replace advice given to you by your health care provider. Make sure you discuss any questions you have with your healthcare provider. Document Revised: 04/14/2017 Document Reviewed: 04/04/2017 Elsevier Patient Education  2022 [employer].

## 2020-11-04 ENCOUNTER — Ambulatory Visit: Payer: Self-pay | Admitting: *Deleted

## 2020-11-08 LAB — HEPATITIS B SURFACE ANTIGEN: Hepatitis B Surface Ag: NONREACTIVE

## 2020-11-08 LAB — PROTEIN ELECTROPHORESIS, SERUM, WITH REFLEX
Albumin ELP: 4.1 g/dL (ref 3.8–4.8)
Alpha 1: 0.3 g/dL (ref 0.2–0.3)
Alpha 2: 0.8 g/dL (ref 0.5–0.9)
Beta 2: 0.2 g/dL (ref 0.2–0.5)
Beta Globulin: 0.4 g/dL (ref 0.4–0.6)
Gamma Globulin: 0.7 g/dL — ABNORMAL LOW (ref 0.8–1.7)
Total Protein: 6.5 g/dL (ref 6.1–8.1)

## 2020-11-08 LAB — C-REACTIVE PROTEIN: CRP: 0.3 mg/L (ref <8.0)

## 2020-11-08 LAB — ANA: Anti Nuclear Antibody (ANA): NEGATIVE

## 2020-11-08 LAB — SEDIMENTATION RATE: Sed Rate: 2 mm/h (ref 0–20)

## 2020-11-08 LAB — ANCA SCREEN W REFLEX TITER: ANCA Screen: NEGATIVE

## 2020-11-08 LAB — HEPATITIS B CORE ANTIBODY, IGM: Hep B C IgM: NONREACTIVE

## 2020-11-08 LAB — HIV ANTIBODY (ROUTINE TESTING W REFLEX): HIV 1&2 Ab, 4th Generation: NONREACTIVE

## 2020-11-08 LAB — HEPATITIS C ANTIBODY
Hepatitis C Ab: NONREACTIVE
SIGNAL TO CUT-OFF: 0.01 (ref <1.00)

## 2020-11-08 LAB — CRYOGLOBULIN: Cryoglobulin, Qualitative Analysis: NOT DETECTED

## 2020-11-08 LAB — FLUORESCENT TREPONEMAL AB(FTA)-IGG-BLD: Fluorescent Treponemal ABS: NONREACTIVE

## 2020-11-09 ENCOUNTER — Telehealth: Payer: Self-pay | Admitting: Radiology

## 2020-11-09 NOTE — Telephone Encounter (Signed)
Lab results so far do not indicate any systemic disease causing the problem. I do plan to discuss further at follow up visit the possible causes based on this and options for treatments.

## 2020-11-09 NOTE — Telephone Encounter (Signed)
Received voicemail from patient regarding lab results (collected on 11/02/2020). Patient is scheduled for follow-up visit on 11/16/2020, would you like to discuss lab results in detail at that time?

## 2020-11-09 NOTE — Telephone Encounter (Signed)
Spoke with patient, advised lab results so far do not indicate any systemic disease causing the problem. Advised patient Dr. Benjamine Mola does plan to discuss further at follow up visit the possible causes based on this and options for treatments.

## 2020-11-10 ENCOUNTER — Other Ambulatory Visit: Payer: Self-pay | Admitting: *Deleted

## 2020-11-10 NOTE — Patient Outreach (Signed)
[facility]) Care Management  11/10/2020  Danny Hopkins 05-03-1960 [id number]   [facility] unsuccessful outreach to MD referred patient for follow up  Danny Hopkins was referred to [facility] on 10/07/20 by MD referral - Dr Harlan Stains  Referral Reason: for [facility] services Insurance: [name]   Last 6 months admissions/ED visits 10/10/20 fecal impaction in rectum     [facility] Unsuccessful outreach   Outreach attempt to the home number [phone] No answer. .   Plan: [facility] RN CM scheduled this patient for another call attempt within 4-7 business days Unsuccessful outreach on 11/10/20   [name] L. [name], RN, BSN, [place] Coordinator Office number [phone] Mobile number [phone]  Main [facility] number [phone] Fax number [phone]

## 2020-11-12 ENCOUNTER — Other Ambulatory Visit: Payer: Self-pay | Admitting: Internal Medicine

## 2020-11-12 DIAGNOSIS — L309 Dermatitis, unspecified: Secondary | ICD-10-CM

## 2020-11-13 DIAGNOSIS — K649 Unspecified hemorrhoids: Secondary | ICD-10-CM | POA: Diagnosis not present

## 2020-11-13 DIAGNOSIS — J309 Allergic rhinitis, unspecified: Secondary | ICD-10-CM | POA: Diagnosis not present

## 2020-11-13 DIAGNOSIS — K5909 Other constipation: Secondary | ICD-10-CM | POA: Diagnosis not present

## 2020-11-13 DIAGNOSIS — I776 Arteritis, unspecified: Secondary | ICD-10-CM | POA: Diagnosis not present

## 2020-11-15 NOTE — Progress Notes (Signed)
Office Visit Note  Patient: Danny Hopkins             Date of Birth: 1961/03/07           MRN: [id number]             PCP: Harlan Stains, MD Referring: Harlan Stains, MD Visit Date: 11/16/2020   Subjective:  Follow-up (Patient's right ring finger has continued to heal. )   History of Present Illness: Danny Hopkins is a 60 y.o. male here for follow up for digital ulcer of the right hand with vascular insufficiency of the hand suspicious for vasculitis. Labs at initial visit were all negative for antibody markers or for active inflammatory changes. Since our last visit finger wound continues slow healing improvement although remains slightly more swollen and tender than other digits. No new ulcerations or lesions.  Previous HPI: 11/02/20 Danny Hopkins is a 60 y.o. male here for evaluation for vasculitis with digital ulcer of right 4th finger. He has felt in overall usual health but had bene noticing cold and numbness changes in his fingertips for months affecting the right 3-5th fingers. He developed a wound on the tip of his 4th finger in June he does not remember a particular injury or maybe a small abrasion starting this. He did not think it was severe but subsequently developed black discoloration of his whole fingertip peeling of the skin. He saw Dr. Burney Gauze for evaluation and subsequent upper extremity arteriography with findings of no patent palmar arch and diminished digital artery perfusions with normal proximal vasculature up to the wrist. Otherwise he denies feeling other significant symptom or medical changes lately. No skin rashes, lesions, no discoloration or ulceration of other digits or in the past. He has chronic lumbar spine degenerative arthritis with radiculopathy. He has chronic left sided residual weakness due to previous stroke. Workup for the stroke reportedly without any specific secondary underlying cause identified.   Labs reviewed 09/2020 CBC wnl BMP  eGFR 48 UA contaminated, +Ca Oxalate, +glucose   Imaging reviewed 10/09/20 RUE arteriogram IMPRESSION:  Status post ultrasound guided access right common femoral artery for right upper extremity angiogram.  Findings reveal no significant atherosclerotic changes, with no evidence to suggest atheroembolic phenomenon as the source of the fourth digit ulcer, as the arteries are of normal course caliber and contour to the wrist.  The angiographic findings of the right hand are most likely a manifestation of either Buerger's disease (thrombo angiitis obliterans) or other connective tissue disorder/rheumatologic disorder. There was a mild improvement with administration of  nitroglycerin/vasodilators.   10/05/20 LE Vascular ultrasound IMPRESSION: 1. Normal bilateral resting ankle-brachial indices. 2. Markedly blunted digital waveforms bilaterally suggests small vessels disease.   06/23/20 CT Chest/Abdomen/Pelvis IMPRESSION: No significant abnormality is noted in the chest, abdomen or pelvis.   Review of Systems  Constitutional:  Positive for fatigue.  HENT:  Positive for mouth dryness. Negative for mouth sores and nose dryness.   Eyes:  Positive for itching. Negative for pain, visual disturbance and dryness.  Respiratory:  Negative for cough, hemoptysis, shortness of breath and difficulty breathing.   Cardiovascular:  Negative for chest pain, palpitations and swelling in legs/feet.  Gastrointestinal:  Positive for blood in stool. Negative for abdominal pain, constipation and diarrhea.  Endocrine: Negative for increased urination.  Genitourinary:  Negative for painful urination.  Musculoskeletal:  Negative for joint pain, joint pain, joint swelling, myalgias, muscle weakness, morning stiffness, muscle tenderness and myalgias.  Skin:  Negative  for color change, rash and redness.  Allergic/Immunologic: Negative for susceptible to infections.  Neurological:  Positive for memory loss. Negative for  dizziness, numbness, headaches and weakness.  Hematological:  Negative for swollen glands.  Psychiatric/Behavioral:  Negative for confusion and sleep disturbance.    PMFS History:  Patient Active Problem List   Diagnosis Date Noted   Vasculitis ([place]) 11/02/2020   Allergic rhinitis 08/23/2020   Acute dysfunction of right eustachian tube 08/23/2020   Dysphagia 06/10/2020   Sore in nose 05/21/2020   Weight loss 05/19/2020   Left arm pain 04/19/2020   Migraines 01/07/2020   Depression 08/23/2019   Spasticity 05/29/2019   Vitamin D deficiency 08/25/2018   Laceration of left hand 09/27/2017   Acute pharyngitis 07/14/2017   Chronic tonsillitis 07/12/2017   Chronic pain syndrome 06/22/2017   Bradycardia 06/22/2017   Abnormal ECG 06/22/2017   Former smoker 06/21/2016   Scalp pain 07/30/2015   Muscle spasticity 06/24/2015   Dandruff 06/24/2015   Lymphadenitis 06/24/2015   Burning with urination 01/25/2015   Balanitis 01/25/2015   Sleeping difficulties 02/04/2014   Bladder neck obstruction 02/04/2014   Hyperlipidemia 02/04/2014   Anterior spinal artery compression syndrome 10/10/2013   Other malaise and fatigue 07/24/2013   Stroke ([place]) 07/24/2013   Encounter for well adult exam with abnormal findings 07/24/2013   Neurologic gait disorder 07/24/2013   Displacement of lumbar intervertebral disc without myelopathy 05/29/2013   Cervical spondylosis with myelopathy 01/30/2013   Insomnia 10/12/2012   Generalized anxiety disorder 10/12/2012   Eczema 10/12/2012   History of stroke 10/12/2012   Corn or callus 07/07/2010   ED (erectile dysfunction) of organic origin 07/07/2010   Neurogenic bladder 06/10/2010   Unspecified urinary incontinence 06/10/2010   Hemiplegia ([place]) 06/09/2010    Past Medical History:  Diagnosis Date   Anxiety    07/2019   Arthritis    Depression 08/23/2019   History of chicken pox    Hyperlipidemia    2015   Neurologic gait disorder 07/24/2013   Chronic  post stroke   Stroke [facility])    2006    Family History  Problem Relation Age of Onset   Healthy Sister    Healthy Sister    Healthy Brother    Healthy Brother    Stomach cancer Neg Hx    Rectal cancer Neg Hx    Pancreatic cancer Neg Hx    Colon cancer Neg Hx    Colon polyps Neg Hx    Esophageal cancer Neg Hx    Past Surgical History:  Procedure Laterality Date   cervical staph infection     2006   COLONOSCOPY     2015   INGUINAL HERNIA REPAIR     kindergarten   IR ANGIOGRAM EXTREMITY RIGHT  10/09/2020   IR INTRAVASCULAR ULTRASOUND NON CORONARY  10/13/2020   IR RADIOLOGIST EVAL & MGMT  09/24/2020   IR RADIOLOGIST EVAL & MGMT  10/27/2020   IR US GUIDE VASC ACCESS RIGHT  10/09/2020   Social History   Social History Narrative   Regular exercise-no   Caffeine Use-no         Immunization History  Administered Date(s) Administered   Influenza Inj Mdck Quad Pf 12/09/2017   Influenza,inj,Quad PF,6+ Mos 02/04/2014, 01/20/2015, 12/23/2015, 12/13/2016, 12/10/2018   Influenza-Unspecified 03/29/2011, 02/02/2012, 03/28/2018, 12/30/2019   PFIZER Comirnaty(Gray Top)Covid-19 Tri-Sucrose Vaccine 09/11/2020   PFIZER(Purple Top)SARS-COV-2 Vaccination 06/25/2019, 07/16/2019, 03/11/2020   Td 12/23/2015   Tdap 03/28/2009, 09/20/2017, 10/29/2018   Zoster  Recombinat (Shingrix) 10/29/2018, 04/21/2019     Objective: Vital Signs: BP 120/78 (BP Location: Left Arm, Patient Position: Sitting, Cuff Size: Normal)   Pulse 72   Ht '5\' 7"'  (1.702 m)   Wt 127 lb 6.4 oz (57.8 kg)   BMI 19.95 kg/m    Physical Exam Skin:    General: Skin is warm and dry.     Findings: No rash.     Comments: Right 4th digit small ulcer <1cm diameter appears clean, dry, intact there is mild surrounding swelling Dusky discoloration over distal 4th and 5th fingertips without lesions  Neurological:     General: No focal deficit present.     Musculoskeletal Exam:  Left shoulder abduction and extension limited without  tenderness to pressure and no swelling, right shoulder normal Elbows full ROM no tenderness or swelling Wrists full ROM no tenderness or swelling Fingers full ROM no tenderness or swelling  Investigation: No additional findings.  Imaging: No results found.  Recent Labs: Lab Results  Component Value Date   WBC 3.9 (L) 10/09/2020   HGB 14.9 10/09/2020   PLT 237 10/09/2020   NA 135 10/09/2020   K 4.2 10/09/2020   CL 104 10/09/2020   CO2 22 10/09/2020   GLUCOSE 92 10/09/2020   BUN 18 10/09/2020   CREATININE 1.64 (H) 10/09/2020   BILITOT 1.0 05/19/2020   ALKPHOS 74 05/19/2020   AST 14 05/19/2020   ALT 14 05/19/2020   PROT 6.5 11/02/2020   ALBUMIN 4.2 05/19/2020   CALCIUM 9.2 10/09/2020    Speciality Comments: No specialty comments available.  Procedures:  No procedures performed Allergies: Patient has no known allergies.   Assessment / Plan:     Visit Diagnoses: Vasculitis ([place])  No particular evidence of an inflammatory vasculitis on workup at last visit normal inflammation markers and negative antibody markers. Suspect maybe more of a peripheral vasculopathy noninflammatory process. Due to severity of ulcer and risk for digital loss and complication recommend starting amlodipine 5 mg daily as low dose CCB today as initial treatment but no DMARD treatment. Blood pressure is at normal so would titrate or add slowly can f/u in 109mo to reassess progress.  Orders: No orders of the defined types were placed in this encounter.  Meds ordered this encounter  Medications   amLODipine (NORVASC) 5 MG tablet    Sig: Take 1 tablet (5 mg total) by mouth daily.    Dispense:  30 tablet    Refill:  2     Follow-Up Instructions: Return in about 3 months (around 02/16/2021) for ?Buerger's amlodipine start f/u 363mo   ChCollier SalinaMD  Note - This record has been created using [employer] Chart creation errors have been sought, but may not always  have been located.  Such creation errors do not reflect on  the standard of medical care.

## 2020-11-16 ENCOUNTER — Other Ambulatory Visit: Payer: Self-pay

## 2020-11-16 ENCOUNTER — Ambulatory Visit: Payer: Medicare HMO | Admitting: Internal Medicine

## 2020-11-16 ENCOUNTER — Encounter: Payer: Self-pay | Admitting: Internal Medicine

## 2020-11-16 VITALS — BP 120/78 | HR 72 | Ht 67.0 in | Wt 127.4 lb

## 2020-11-16 DIAGNOSIS — I776 Arteritis, unspecified: Secondary | ICD-10-CM | POA: Diagnosis not present

## 2020-11-16 MED ORDER — AMLODIPINE BESYLATE 5 MG PO TABS: 5.0000 mg | ORAL_TABLET | Freq: Every day | ORAL | 2 refills | Status: DC

## 2020-11-16 NOTE — Patient Instructions (Signed)
Amlodipine Tablets What is this medication? AMLODIPINE (am LOE di peen) treats high blood pressure and prevents chest pain (angina). It works by relaxing the blood vessels, which helps decrease the amount of work your heart has to do. It belongs to a group of medicationscalled calcium channel blockers. This medicine may be used for other purposes; ask your health care provider orpharmacist if you have questions. COMMON BRAND NAME(S): Norvasc What should I tell my care team before I take this medication? They need to know if you have any of these conditions: Heart disease Liver disease An unusual or allergic reaction to amlodipine, other medications, foods, dyes, or preservatives Pregnant or trying to get pregnant Breast-feeding How should I use this medication? Take this medication by mouth. Take it as directed on the prescription label at the same time every day. You can take it with or without food. If it upsets your stomach, take it with food. Keep taking it unless your care team tells youto stop. Talk to your care team about the use of this medication in children. While it may be prescribed for children as young as 6 for selected conditions,precautions do apply. Overdosage: If you think you have taken too much of this medicine contact apoison control center or emergency room at once. NOTE: This medicine is only for you. Do not share this medicine with others. What if I miss a dose? If you miss a dose, take it as soon as you can. If it is almost time for yournext dose, take only that dose. Do not take double or extra doses. What may interact with this medication? Clarithromycin Cyclosporine Diltiazem Itraconazole Simvastatin Tacrolimus This list may not describe all possible interactions. Give your health care provider a list of all the medicines, herbs, non-prescription drugs, or dietary supplements you use. Also tell them if you smoke, drink alcohol, or use illegaldrugs. Some items may  interact with your medicine. What should I watch for while using this medication? Visit your health care provider for regular checks on your progress. Check your blood pressure as directed. Ask your health care provider what your bloodpressure should be. Also, find out when you should contact him or her. Do not treat yourself for coughs, colds, or pain while you are using this medication without asking your health care provider for advice. Somemedications may increase your blood pressure. You may get drowsy or dizzy. Do not drive, use machinery, or do anything that needs mental alertness until you know how this medication affects you. Do not stand up or sit up quickly, especially if you are an older patient. This reduces the risk of dizzy or fainting spells. Alcohol can make you more drowsyand dizzy. Avoid alcoholic drinks. What side effects may I notice from receiving this medication? Side effects that you should report to your care team as soon as possible: Allergic reactions-skin rash, itching, hives, swelling of the face, lips, tongue, or throat Heart attack-pain or tightness in the chest, shoulders, arms, or jaw, nausea, shortness of breath, cold or clammy skin, feeling faint or lightheaded Low blood pressure-dizziness, feeling faint or lightheaded, blurry vision Side effects that usually do not require medical attention (report these toyour care team if they continue or are bothersome): Facial flushing, redness Heart palpitations-rapid, pounding, or irregular heartbeat Nausea Stomach pain Swelling of the ankles, hands, or feet This list may not describe all possible side effects. Call your doctor for medical advice about side effects. You may report side effects to FDA at1-800-FDA-1088. Where should  I keep my medication? Keep out of the reach of children and pets. Store at room temperature between 20 and 25 degrees C (68 and 77 degrees F). Protect from light and moisture. Keep the container  tightly closed. Get rid ofany unused medication after the expiration date. To get rid of medications that are no longer needed or have expired: Take the medication to a medication take-back program. Check with your pharmacy or law enforcement to find a location. If you cannot return the medication, check the label or package insert to see if the medication should be thrown out in the garbage or flushed down the toilet. If you are not sure, ask your health care provider. If it is safe to put in the trash, empty the medication out of the container. Mix the medication with cat litter, dirt, coffee grounds, or other unwanted substance. Seal the mixture in a bag or container. Put it in the trash. NOTE: This sheet is a summary. It may not cover all possible information. If you have questions about this medicine, talk to your doctor, pharmacist, orhealth care provider.  2022 Elsevier/Gold Standard (2020-02-08 14:59:47)

## 2020-11-19 DIAGNOSIS — M4712 Other spondylosis with myelopathy, cervical region: Secondary | ICD-10-CM | POA: Diagnosis not present

## 2020-11-20 ENCOUNTER — Encounter: Payer: Self-pay | Admitting: Gastroenterology

## 2020-11-23 ENCOUNTER — Ambulatory Visit: Payer: Self-pay | Admitting: *Deleted

## 2020-11-25 ENCOUNTER — Other Ambulatory Visit: Payer: Self-pay | Admitting: *Deleted

## 2020-11-25 ENCOUNTER — Encounter: Payer: Self-pay | Admitting: *Deleted

## 2020-11-25 NOTE — Patient Outreach (Signed)
[facility]) Care Management  11/25/2020  Danny Hopkins 1960-06-21 [id number]   [facility] second outreach to MD referred patient for follow up  Mr Danny Hopkins was referred to [facility] on 10/07/20 by MD referral - Dr Harlan Stains  Referral Reason: for [facility] services Insurance: [name]   Last 6 months admissions/ED visits 10/10/20 fecal impaction in rectum      [facility] Unsuccessful outreach     Outreach attempt to the home number [phone] No answer. .    Plan: [facility] RN CM scheduled this patient for another call attempt within 4-7 business days [facility] unsuccessful outreach letter sent on 11/25/20  Unsuccessful outreach on 11/10/20, 11/25/20      Joelene Millin L. [name], RN, BSN, [place] Coordinator Office number [phone] Mobile number [phone]  Main [facility] number [phone] Fax number [phone]

## 2020-11-25 NOTE — Patient Outreach (Signed)
[place] [facility]) Care Management  11/25/2020  Danny Hopkins [birth year] [id number]    MD referred patient for follow up  Danny Hopkins was referred to [facility] on 10/07/20 by MD referral - Dr Harlan Stains  Referral Reason: for [place]: [name] changing to [employer] on  11/26/20 per patient   Last 6 months admissions/ED visits 10/10/20 fecal impaction in rectum    Danny Hopkins returned a call to RN CM and was able to verify HIPAA identifiers He reports overall he is doing well and denies any immediate concerns or needs     Follow up Assessment  digital ulcer of the right hand with vascular insufficiency of the hand suspicious for vasculitis Fingers are healing  He denies numbness or cold digits    Weight/nutrition  reports his appetite is better  He was 135 lbs but is noted to be maintaining weight that varies 5 lbs more or less  Chronic back pain  Chronic lumbar spine degenerative arthritis with radiculopathy Back pain cause mobility issues per patient Chronic left sided weakness related to history of stroke  He had inquired about Personal care services [facility]) but now reports he would like to postpone this option of care   Migraines  manageable with medicine  Bladder  manageable with medicine  Smoke cessation  Still does not prefer to stop smoking  Plan Patient agrees to care plan and follow up within the next 30 business days  Goals Addressed               This Visit's Progress     Patient Stated     Stay Active and Independent-[facility]) (pt-stated)   On track     Timeframe:  Short-Term Goal Priority:  Medium Start Date:                   11/25/20          Expected End Date:         01/25/21              Follow Up Date 12/25/20  Barriers: Knowledge   - walk outside    Notes:  11/25/20 continues to stay active as much as possible          Danny Hopkins L. [name], RN, BSN, [place] Coordinator Office number [phone] Main [facility] number [phone] Fax number [phone]

## 2020-11-27 ENCOUNTER — Ambulatory Visit (INDEPENDENT_AMBULATORY_CARE_PROVIDER_SITE_OTHER): Payer: Medicare Other | Admitting: Family Medicine

## 2020-11-27 ENCOUNTER — Ambulatory Visit: Payer: Self-pay

## 2020-11-27 ENCOUNTER — Other Ambulatory Visit: Payer: Self-pay

## 2020-11-27 VITALS — BP 112/76 | HR 61 | Ht 67.0 in | Wt 131.6 lb

## 2020-11-27 DIAGNOSIS — M25512 Pain in left shoulder: Secondary | ICD-10-CM | POA: Diagnosis not present

## 2020-11-27 DIAGNOSIS — G8929 Other chronic pain: Secondary | ICD-10-CM

## 2020-11-27 NOTE — Progress Notes (Signed)
I, Peterson Lombard, LAT, ATC acting as a scribe for Lynne Leader, MD.  Danny Hopkins is a 60 y.o. male who presents to [place] at [facility] today for continued L shoulder pain starting hurting again about a week or 2 ago. Pt was last seen by Dr. Georgina Snell on 07/14/20 for chronic LBP. Today, pt locates pain to all over [facility] joint. Pt notes L shoulder pain has been waking him up at night. Pt was last seen by Dr. Georgina Snell on 04/22/20 for his L shoulder pain and had a L subacromial steroid injection.  Neck pain: no UE numbness/tingling: no UE weakness: no Aggravates: shoulder flex- like when trying to mow Treatments tried: no  Pertinent review of systems: No fevers or chills  Relevant historical information: History of stroke involving left hand and affecting gait   Exam:  BP 112/76   Pulse 61   Ht '5\' 7"'$  (1.702 m)   Wt 131 lb 9.6 oz (59.7 kg)   SpO2 100%   BMI 20.61 kg/m  General: Well Developed, well nourished, and in no acute distress.   MSK: Left shoulder normal. Nontender. Decreased range of motion to abduction limited by pain.  Internal rotation external rotation normal. Strength intact abduction and external/internal rotation. Positive Hawkins and Neer's test.  Positive empty can test.    Lab and Radiology Results  Procedure: Real-time Ultrasound Guided Injection of left shoulder subacromial bursa Device: Philips Affiniti 50G Images permanently stored and available for review in PACS Verbal informed consent obtained.  Discussed risks and benefits of procedure. Warned about infection bleeding damage to structures skin hypopigmentation and fat atrophy among others. Patient expresses understanding and agreement Time-out conducted.   Noted no overlying erythema, induration, or other signs of local infection.   Skin prepped in a sterile fashion.   Local anesthesia: Topical Ethyl chloride.   With sterile technique and under real time ultrasound guidance: 40  mg of Kenalog and 2 mL of Marcaine injected into subacromial bursa bursa. Fluid seen entering the bursa.   Completed without difficulty   Pain immediately resolved suggesting accurate placement of the medication.   Advised to call if fevers/chills, erythema, induration, drainage, or persistent bleeding.   Images permanently stored and available for review in the ultrasound unit.  Impression: Technically successful ultrasound guided injection.    EXAM: LEFT SHOULDER - 2+ VIEW   COMPARISON:  None.   FINDINGS: Normal alignment with approximation of the joints. No fracture or focal osseous lesion. Chronic left clavicle and left rib deformities. Mild glenohumeral and moderate acromioclavicular degenerative changes. Soft tissues are within normal limits. Sequela of ACDF.   IMPRESSION: No acute osseous abnormality.   Mild glenohumeral and moderate acromioclavicular osteoarthrosis.     Electronically Signed   By: Primitivo Gauze M.D.   On: 04/22/2020 15:22 I, Lynne Leader, personally (independently) visualized and performed the interpretation of the images attached in this note.     Assessment and Plan: 60 y.o. male with left shoulder pain thought to be due to recurrent subacromial bursitis.  Last injection was April 22, 2020 for this issue.  Plan for repeat injection and continued home exercise program.  If needed will refer back to physical therapy. Recommend also Voltaren gel.  Recheck back as needed.   PDMP not reviewed this encounter. Orders Placed This Encounter  Procedures   [place] LIMITED JOINT SPACE STRUCTURES UP LEFT(NO LINKED CHARGES)    Standing Status:   Future    Number of Occurrences:  1    Standing Expiration Date:   05/27/2021    Order Specific Question:   Reason for Exam (SYMPTOM  OR DIAGNOSIS REQUIRED)    Answer:   left shoulder pain    Order Specific Question:   Preferred imaging location?    Answer:   [place]   No orders of  the defined types were placed in this encounter.    Discussed warning signs or symptoms. Please see discharge instructions. Patient expresses understanding.   The above documentation has been reviewed and is accurate and complete Lynne Leader, M.D.

## 2020-11-27 NOTE — Patient Instructions (Signed)
Thank you for coming in today.   Call or go to the ER if you develop a large red swollen joint with extreme pain or oozing puss.    Let me know if you want PT referral.   Please use Voltaren gel (Generic Diclofenac Gel) up to 4x daily for pain as needed.  This is available over-the-counter as both the name brand Voltaren gel and the generic diclofenac gel.   Recheck as needed.

## 2020-12-03 ENCOUNTER — Other Ambulatory Visit: Payer: Self-pay | Admitting: Internal Medicine

## 2020-12-03 NOTE — Telephone Encounter (Signed)
Please refill as per office routine med refill policy (all routine meds to be refilled for 3 mo or monthly (per pt preference) up to one year from last visit, then month to month grace period for 3 mo, then further med refills will have to be denied) ? ?

## 2020-12-04 ENCOUNTER — Ambulatory Visit: Payer: Self-pay | Admitting: *Deleted

## 2020-12-17 ENCOUNTER — Ambulatory Visit: Payer: Medicare HMO | Admitting: Gastroenterology

## 2020-12-25 ENCOUNTER — Other Ambulatory Visit: Payer: Self-pay | Admitting: *Deleted

## 2020-12-25 ENCOUNTER — Other Ambulatory Visit: Payer: Self-pay

## 2020-12-25 ENCOUNTER — Encounter: Payer: Self-pay | Admitting: *Deleted

## 2020-12-25 NOTE — Patient Outreach (Addendum)
[facility]) Care Management  12/25/2020  Danny Hopkins 04/27/1960 [id number]   MD referred patient for follow up  Danny Danny Hopkins was referred to [facility] on 10/07/20 by MD referral - Dr Harlan Stains  Referral Reason: for [facility] services Insurance: [name] changing to united healthcare on  11/26/20 per patient  medicaid Last 6 months admissions/ED visits 10/10/20 fecal impaction in rectum      Danny Danny Hopkins was able to verify HIPAA identifiers He reports overall today he is doing fair related to pain symptoms        Follow up Assessment  digital ulcer of the right hand with vascular insufficiency of the hand suspicious for vasculitis Healed and is now really doing  very well    Chronic lumbar spine degenerative arthritis with radiculopathy  Back pain cause mobility issues per patient Chronic left sided weakness related to history of stroke  He recently was seen as his pain symptoms increased Danny Hopkins states lately he awakes at 0800 and takes his hydrocodone, start house chores and by 1000 he has to lie down on his couch (the only thing that helps) to let the pain decrease because he only takes hydrocodone every 6 hours prn as ordered He is noticing his pain medication is not lasting as long as it previously lasted and causes him to have a pain level of 10  He reports he can tolerate a pain level of 5  He reports he is having to use 2 canes to ambulate He reports minimal driving to do errands or to drive dog around He confirms he called Dr Annette Stable who recommended pain management and he then called to get a pain management appointment for 12/31/20    RN CM discussed break through pain medicine option with him, use of heat/cold, use of pain patches, etc He was encouraged to attempt to use these option until he get to his MD and then discuss a more detailed home pain plan of care with the MD He agree to do this He reports his pain patches only help minimally     Personal care services (PCS)  During the last outreach he postpone assist for  [facility] but  inquired about Personal care services [facility]) again related to the increase pain he is recently experiencing  He reports he can not afford to private pay for personal services  Education - Insurance, home services With the discussion of home services Danny Hopkins informed RN CM he believes his insurance agent informed him he has medicaid, EPIC does not indicate medicaid coverage- no card scanned. He confirms he does not have a medicaid care and only describes his new [facility] card Detail education provided on medicare parts A, B, C D and difference in [place]  Department of Social Services (DSS)  He has received utility assist from [place] and will be returning in December 2022 to review his services He provided permission for RN CM to request DSS mail him a medicaid application so he can review it to see the items he will need to take with him. He may have to complete a face to face for entry of application details    Plan Patient agrees to care plan and follow up within the next 30 business days  Danny Zentz L. [name], RN, BSN, [place] Coordinator Office number [phone] Main [facility] number [phone] Fax number [phone]

## 2020-12-31 DIAGNOSIS — M5416 Radiculopathy, lumbar region: Secondary | ICD-10-CM | POA: Diagnosis not present

## 2020-12-31 DIAGNOSIS — M47816 Spondylosis without myelopathy or radiculopathy, lumbar region: Secondary | ICD-10-CM | POA: Diagnosis not present

## 2020-12-31 DIAGNOSIS — R030 Elevated blood-pressure reading, without diagnosis of hypertension: Secondary | ICD-10-CM | POA: Diagnosis not present

## 2020-12-31 DIAGNOSIS — R252 Cramp and spasm: Secondary | ICD-10-CM | POA: Diagnosis not present

## 2021-01-15 ENCOUNTER — Other Ambulatory Visit: Payer: Self-pay | Admitting: *Deleted

## 2021-01-15 NOTE — Patient Outreach (Signed)
[facility]) Care Management  01/15/2021  Danny Hopkins 05/14/1960 [id number]   Error in opening encounter  Merom. [name], RN, BSN, [place] Coordinator Office number [phone] Main [facility] number [phone] Fax number [phone]

## 2021-01-15 NOTE — Patient Outreach (Signed)
[facility]) Care Management  01/15/2021  Danny Hopkins [birth year] [id number]  Telephone outreach on behalf of Jackelyn Poling, RN.  Mr. [name] did not answer the call but care manager was able to leave a message to return my call.  Will advise Ms. [name] of this unsuccessful call.  Eulah Pont. Myrtie Neither, MSN, [facility] Gerontological Nurse Practitioner [facility] Care Management [phone]

## 2021-01-21 ENCOUNTER — Ambulatory Visit (INDEPENDENT_AMBULATORY_CARE_PROVIDER_SITE_OTHER): Payer: Medicare Other | Admitting: Gastroenterology

## 2021-01-21 ENCOUNTER — Encounter: Payer: Self-pay | Admitting: Gastroenterology

## 2021-01-21 VITALS — BP 88/54 | HR 74 | Ht 67.0 in | Wt 134.8 lb

## 2021-01-21 DIAGNOSIS — T402X5A Adverse effect of other opioids, initial encounter: Secondary | ICD-10-CM | POA: Insufficient documentation

## 2021-01-21 DIAGNOSIS — K625 Hemorrhage of anus and rectum: Secondary | ICD-10-CM | POA: Diagnosis not present

## 2021-01-21 DIAGNOSIS — K5903 Drug induced constipation: Secondary | ICD-10-CM | POA: Diagnosis not present

## 2021-01-21 DIAGNOSIS — K648 Other hemorrhoids: Secondary | ICD-10-CM | POA: Diagnosis not present

## 2021-01-21 MED ORDER — MOVANTIK 25 MG PO TABS: 25.0000 mg | ORAL_TABLET | Freq: Every morning | ORAL | 5 refills | Status: AC

## 2021-01-21 NOTE — Patient Instructions (Addendum)
Continue Movantik, stool softener daily.   Continue Hydrocortisone cream as needed.  Follow up as needed.  If you are age 58 or older, your body mass index should be between 23-30. Your Body mass index is 21.11 kg/m. If this is out of the aforementioned range listed, please consider follow up with your Primary Care Provider.  If you are age 13 or younger, your body mass index should be between 19-25. Your Body mass index is 21.11 kg/m. If this is out of the aformentioned range listed, please consider follow up with your Primary Care Provider.   ________________________________________________________  The [place] [facility] providers would like to encourage you to use [facility] to communicate with providers for non-urgent requests or questions.  Due to long hold times on the telephone, sending your provider a message by [facility] may be a faster and more efficient way to get a response.  Please allow 48 business hours for a response.  Please remember that this is for non-urgent requests.  _______________________________________________________

## 2021-01-21 NOTE — Progress Notes (Signed)
01/21/2021 Danny Hopkins [id number] 1961/01/23   HISTORY OF PRESENT ILLNESS: This is a 60 year old male who is a patient Dr. Vena Rua known to him only for colonoscopy as below.  He is here today with complaints of rectal bleeding and constipation.  He follows with pain management for chronic pain and is on Norco and baclofen.  He has been suffering from constipation related to this medications.  They advised him to begin taking stool softeners and prescribed Movantik 25 mg daily.  He says that since taking that he is moving his bowels better.  He was having red rectal bleeding when he would have large, hard bowel movements.  He says that since taking the bowel regimen things have improved.  He was also prescribed Anusol cream to use.  Colonoscopy 09/2019:   - One 4 mm polyp in the ascending colon, removed with a cold snare. Resected and retrieved. - One 5 mm polyp in the transverse colon, removed with a cold snare. Resected and retrieved. - Internal hemorrhoids.  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 His smoking use included cigarettes. He has never used smokeless tobacco. He reports that he does not currently use  alcohol. He reports that he does not currently use drugs after having used the following drugs: Marijuana. family history includes Healthy in his brother, brother, sister, and sister. No Known Allergies    Outpatient Encounter Medications as of 01/21/2021  Medication Sig   ALPRAZolam (XANAX) 1 MG tablet TAKE 1 TABLET IN THE MORNING AND 1 AND 1/2 TABLETS IN THE EVENING AS NEEDED (Patient taking differently: Take 1 mg by mouth 2 (two) times daily.)   atorvastatin (LIPITOR) 20 MG tablet TAKE 1 TABLET BY MOUTH EVERY DAY (Patient taking differently: Take 20 mg by mouth daily.)   baclofen (LIORESAL) 10 MG tablet Take 10 mg by mouth daily as needed (Back spasms).   Cholecalciferol (VITAMIN D3) 50 MCG (2000 UT) TABS Take 2,000 Units by mouth daily.   clotrimazole-betamethasone (LOTRISONE) cream APPLY TO AFFECTED AREA TWICE A DAY   docusate sodium (COLACE) 100 MG capsule Take 200 mg by mouth 2 (two) times daily.   fluticasone (FLONASE) 50 MCG/ACT nasal spray Place 2 sprays into both nostrils daily.   HYDROcodone-acetaminophen (NORCO) 10-325 MG per tablet Take 1 tablet by mouth every 6 (six) hours.   hydrocortisone (ANUSOL-HC) 2.5 % rectal cream Apply 1 application topically 2 (two) times daily.   naproxen (NAPROSYN) 500 MG tablet TAKE 1 TABLET (500 MG TOTAL) BY MOUTH 2 (TWO) TIMES  DAILY AS NEEDED FOR HEADACHE.   omeprazole (PRILOSEC) 20 MG capsule TAKE 1 CAPSULE BY MOUTH DAILY AS NEEDED.   SUMAtriptan (IMITREX) 100 MG tablet Take 1 tablet (100 mg total) by mouth every 2 (two) hours as needed for migraine or headache. May repeat in 2 hours if headache persists or recurs.   tamsulosin (FLOMAX) 0.4 MG CAPS capsule TAKE 1 CAPSULE BY MOUTH EVERY DAY (Patient taking differently: Take 0.4 mg by mouth daily.)   [DISCONTINUED] MOVANTIK 25 MG TABS tablet Take 25 mg by mouth every morning.   amLODipine (NORVASC) 5 MG tablet TAKE 1 TABLET (5 MG TOTAL) BY MOUTH DAILY. (Patient not taking: Reported on 01/21/2021)    MOVANTIK 25 MG TABS tablet Take 1 tablet (25 mg total) by mouth every morning.   [DISCONTINUED] fexofenadine (ALLEGRA) 180 MG tablet Take 1 tablet (180 mg total) by mouth daily. (Patient not taking: Reported on 11/16/2020)   [DISCONTINUED] guaiFENesin (MUCINEX) 600 MG 12 hr tablet Take 2 tablets (1,200 mg total) by mouth 2 (two) times daily as needed. (Patient not taking: No sig reported)   [DISCONTINUED] HYDROcodone-acetaminophen (NORCO) 10-325 MG tablet Take by mouth.   [DISCONTINUED] mupirocin nasal ointment (BACTROBAN) 2 % Place 1 application into the nose 2 (two) times daily. Use one-half of tube in each nostril twice daily for five (5) days. After application, press sides of nose together and gently massage. (Patient not taking: Reported on 11/16/2020)   [DISCONTINUED] PARoxetine (PAXIL) 10 MG tablet Take 1 tablet (10 mg total) by mouth daily. (Patient not taking: No sig reported)   [DISCONTINUED] SHINGRIX injection  (Patient not taking: No sig reported)   [DISCONTINUED] sulfamethoxazole-trimethoprim (BACTRIM DS) 800-160 MG tablet Take 1 tablet by mouth 2 (two) times daily. (Patient not taking: No sig reported)   No facility-administered encounter medications on file as of 01/21/2021.     REVIEW OF SYSTEMS  : All other systems reviewed and negative except where noted in the History of Present Illness.   PHYSICAL EXAM: BP (!) 88/54 (BP Location: Left Arm, Patient Position: Sitting, Cuff Size: Normal)   Pulse 74   Ht 5\' 7"  (1.702 m)   Wt 134 lb 12.8 oz (61.1 kg)   BMI 21.11 kg/m  General: Well developed AA male in no acute distress Head: Normocephalic and atraumatic Eyes:  Sclerae anicteric, conjunctiva pink. Ears: Normal auditory acuity Lungs: Clear throughout to auscultation; no W/R/R Heart: Regular rate and rhythm; no M/R/G. Abdomen: Soft, non-distended.  BS present.  Non-tender. Rectal:  No external abnormalities.  DRE revealed some hard, mobile stool in the rectal vault.  No  other abnormalities noted.   Musculoskeletal: Symmetrical with no gross deformities  Skin: No lesions on visible extremities Extremities: No edema  Neurological: Alert oriented x 4, grossly non-focal Psychological:  Alert and cooperative. Normal mood and affect  ASSESSMENT AND PLAN: *Constipation, narcotic/opioid induced:  Improved on colace and Movantik, which was prescribed by pain management, but he has asked me if I can renew his prescription so I did send that to the pharmacy for him.  Will continue Movantik 25 mg daily and colace stool softeners. Prescription sent to pharmacy. *Rectal bleeding:  Due to internal hemorrhoids that become irritated with constipation.  Doing better since using colace and Movantik.  Can use ansuol prn, needs to be inserted into the anus/rectum to be effective for internal hemorrhoids.   CC:  Harlan Stains, MD

## 2021-01-22 ENCOUNTER — Other Ambulatory Visit: Payer: Self-pay

## 2021-01-22 ENCOUNTER — Other Ambulatory Visit: Payer: Self-pay | Admitting: *Deleted

## 2021-01-22 NOTE — Patient Outreach (Signed)
[facility]) Care Management  01/22/2021  PIO EATHERLY 1960/08/24 [id number]   MD referred patient for follow up  Mr ELIJAHJAMES FUELLING was [facility] to [facility] on 10/07/20 by MD referral - Dr Harlan Stains  Referral Reason: for [place]: [name] changing to united healthcare on  11/26/20 per patient  medicaid Last 6 months admissions/ED visits 10/10/20 fecal impaction in rectum      Mr Kroft was able to verify HIPAA identifiers  Plans Patient agrees to care plan and follow up within the next 7-14 business days   Goals Addressed               This Visit's Progress     Patient Stated     Find Help in My Community([facility]) (pt-stated)   Not on track     Timeframe:  Short-Term Goal Priority:  Medium Start Date:             01/22/21                Expected End Date:      02/24/21                 Follow Up Date 01/29/21  Barriers: Knowledge    - follow-up on any referrals for help I am given   Notes:  01/22/21 has not received a medicaid application (personal care services+). Permission given to RN CM to outreach to local DSS to have one mailed      Stay Active and Independent-Low Back Pain [facility]) (pt-stated)   Not on track     Timeframe:  Long-Range Goal Priority:  High Start Date:                   11/25/20          Expected End Date:         03/26/21              Follow Up Date 01/29/21  Barriers: Knowledge    - walk outside  -take medicine as ordered -outreach to MD to voice concerns, changes    Notes:  01/22/21 continues with lower back pain, having to cluster activities, feels present pain management medicines not effective any more, Spoke with Dr Annette Stable who referred him to Dr Hoyt Koch, He reports speaking with [facility], Voiced concern today with noted decrease in pain medicines at local pharmacy from 120 tab to 112 tabs Permission given as he and RN CM completed conference call and left message for [name], assistant  [phone]) for [facility] requesting a return call about change in pain management action plan or options. NOT preferring injections. 01/15/21 unsuccessful outreach 11/25/20 continues to stay active as much as possible           Salvatore Poe L. [name], RN, BSN, [place] Coordinator Office number [phone] Main [facility] number [phone] Fax number [phone]

## 2021-01-25 ENCOUNTER — Other Ambulatory Visit: Payer: Self-pay | Admitting: *Deleted

## 2021-01-25 NOTE — Patient Outreach (Signed)
[facility]) Care Management  01/25/2021  Danny Hopkins Feb 21, 1961 [id number]   [facility] outreach  [name] left a message requesting a return call RN CM returned his call  He was able to updated RN CM that he received a call from his pain management providers to review his options for pain management and concerns with changes in medicine. He updated RN CM he has agree to try injections once more   He also updated RN CM he found some medicaid forms but not the complete package RN CM updated him that RN CM spoke with DSS staff to have another medicaid application to be mailed to him  Plan [facility] RN CM will follow up with patient within the next 30 business days  He agrees with plan of care     [name] L. [name], RN, BSN, [place] Coordinator Office number [phone] Mobile number [phone]  Main [facility] number [phone] Fax number [phone]

## 2021-01-26 NOTE — Progress Notes (Signed)
Addendum: Reviewed and agree with assessment and management plan. Everhett Bozard M, MD  

## 2021-01-28 DIAGNOSIS — M5416 Radiculopathy, lumbar region: Secondary | ICD-10-CM | POA: Diagnosis not present

## 2021-01-29 ENCOUNTER — Other Ambulatory Visit: Payer: Self-pay | Admitting: *Deleted

## 2021-01-29 NOTE — Patient Outreach (Signed)
[facility]) Care Management  01/29/2021  AUSTAN NICHOLL [birth year] [id number]   Encounter opened in error

## 2021-02-03 ENCOUNTER — Other Ambulatory Visit: Payer: Self-pay | Admitting: Internal Medicine

## 2021-02-08 DIAGNOSIS — I69354 Hemiplegia and hemiparesis following cerebral infarction affecting left non-dominant side: Secondary | ICD-10-CM | POA: Diagnosis not present

## 2021-02-08 DIAGNOSIS — K5909 Other constipation: Secondary | ICD-10-CM | POA: Diagnosis not present

## 2021-02-08 DIAGNOSIS — M5136 Other intervertebral disc degeneration, lumbar region: Secondary | ICD-10-CM | POA: Diagnosis not present

## 2021-02-08 DIAGNOSIS — I776 Arteritis, unspecified: Secondary | ICD-10-CM | POA: Diagnosis not present

## 2021-02-10 ENCOUNTER — Other Ambulatory Visit: Payer: Self-pay | Admitting: *Deleted

## 2021-02-10 NOTE — Patient Outreach (Signed)
[facility]) Care Management  02/10/2021  Danny Hopkins 11/16/1960 [id number]   [facility] Care coordination-medicaid application  Incoming call from Mr Danny Hopkins as he is at the [place] with Ms Danny Hopkins Part of his Medicaid application had not been sent to him in the mail. This would have prevented the application process Answered questions for Ms Danny Hopkins on the dates and names of the times RN CM outreached to have medicaid application mailed to patient home  Patient voiced appreciation  Care Plan : Nonspecific Low Back Pain (Adult)  Updates made by Barbaraann Faster, RN since 02/10/2021 12:00 AM     Problem: Chronic Low Back Pain Resolved 02/10/2021  Priority: Medium  Onset Date: 11/25/2020     Long-Range Goal: Chronic Low Back Pain Managed Completed 02/10/2021  Start Date: 11/25/2020  Expected End Date: 03/26/2021  This Visit's Progress: On track  Recent Progress: Not on track  Priority: Medium  Note:        Task: Partner to Develop Chronic Low Back Pain Plan Completed 02/10/2021  Due Date: 03/26/2021  Outcome: Positive  Responsible User: Barbaraann Faster, RN  Note:   Care Management Activities:  Resolving due to duplicate goal 71/06/26 not addressed today when he called from [place] to ask questions He is following up on his resource -medicaid 01/22/21 assessed back pain conference to neuro/pain mgmt with pt LVM for Danny Hopkins for Danny Green NP for [name], spoke with Imunique at [place] to have [place] app mail to home today and previous note routed to [place] and breen   11/25/20 assessed pain and activity level. Doing well coping well keeping active as much as possible - careful application of heat or ice encouraged - complementary therapy use encouraged - medication side effects managed - misuse of pain medication assessed - mutually acceptable comfort goal set - pain assessed - pain management plan developed - pain treatment goals reviewed - patient response  to treatment assessed    Notes:     Care Plan : General Plan of Care (Adult)  Updates made by Barbaraann Faster, RN since 02/10/2021 12:00 AM     Problem: Health Promotion or Disease Self-Management (General Plan of Care) Resolved 02/10/2021  Priority: Medium  Onset Date: 01/22/2021     Goal: Self-Management Plan Developed Completed 02/10/2021  Start Date: 01/22/2021  Expected End Date: 02/24/2021  This Visit's Progress: On track  Recent Progress: Not on track  Priority: Medium  Note:      Task: Mutually Develop and Danny Hopkins Achievement of Patient Goals Completed 02/10/2021  Due Date: 02/24/2021  Outcome: Positive  Responsible User: Barbaraann Faster, RN  Note:   Care Management Activities:   Resolving due to duplicate goal 94/85/46 not addressed today when he called from [place] to ask questions He is following up on his resource -medicaid - barriers to meeting goals identified - choices provided - collaboration with team encouraged - decision-making supported - health risks reviewed - problem-solving facilitated - questions answered - readiness for change evaluated - reassurance provided - resources needed to meet goals identified - self-reliance encouraged    Notes:     Care Plan : [place] of Care  Updates made by Barbaraann Faster, RN since 02/10/2021 12:00 AM     Problem: Complex Care Coordination Needs and disease management in patient with chronic pain, stroke, mobility concerns   Priority: High     Long-Range Goal: Establish Plan of Care for Management Complex SDOH  Barriers, disease management and Care Coordination Needs in patient with chronic pain, stroke, mobility concerns   Start Date: 02/10/2021  This Visit's Progress: On track  Priority: High  Note:   Current Barriers:  Knowledge Deficits related to plan of care for management of chronic pain, stroke, mobility concerns Care Coordination needs related to ADL IADL limitations, Limited  education about chronic pain, stroke, mobility concerns*, and Lacks knowledge of community resource: chronic pain, stroke, mobility concerns  RNCM Clinical Goal(s):  Patient will verbalize understanding of plan for management of chronic pain, stroke, mobility concerns work with [place]  to complete medicaid application  through collaboration with Consulting civil engineer, provider, and care team.   Interventions: Follow up with patient as agree to re assess needs Inter-disciplinary care team collaboration (see longitudinal plan of care) Evaluation of current treatment plan related to  self management and patient's adherence to plan as established by provider  Pain Interventions: Pain assessment performed Medications reviewed Reviewed provider established plan for pain management; Discussed importance of adherence to all scheduled medical appointments; Counseled on the importance of reporting any/all new or changed pain symptoms or management strategies to pain management provider; Advised patient to report to care team affect of pain on daily activities; Discussed use of relaxation techniques and/or diversional activities to assist with pain reduction (distraction, imagery, relaxation, massage, acupressure, TENS, heat, and cold application; Screening for signs and symptoms of depression related to chronic disease state;   Stroke with mobility concernsGoal on track:  Yes Evaluation of current treatment plan related to  stroke, mobility concerns , ADL IADL limitations, Limited education about stroke, mobility concerns*, and Lacks knowledge of community resource: medicaid  self-management and patient's adherence to plan as established by provider. Discussed plans with patient for ongoing care management follow up and provided patient with direct contact information for care management team Provided education to patient re: stroke ; Collaborated with DSS regarding medicaid  application; Discussed plans with patient for ongoing care management follow up and provided patient with direct contact information for care management team; Assessed social determinant of health barriers;   Patient Goals/Self-Care Activities: Patient will self administer medications as prescribed Patient will attend all scheduled provider appointments Patient will call pharmacy for medication refills Patient will attend church or other social activities Patient will continue to perform ADL's independently Patient will continue to perform IADL's independently Patient will call provider office for new concerns or questions  Follow Up Plan:  The patient has been provided with contact information for the care management team and has been advised to call with any health related questions or concerns.  The care management team will reach out to the patient again over the next 30 business  days.      Patient Active Problem List   Diagnosis Date Noted   Therapeutic opioid induced constipation 01/21/2021   Rectal bleeding 01/21/2021   Internal hemorrhoids 01/21/2021   Vasculitis ([place]) 11/02/2020   Lumbar radiculopathy 09/17/2020   Allergic rhinitis 08/23/2020   Acute dysfunction of right eustachian tube 08/23/2020   Dysphagia 06/10/2020   Sore in nose 05/21/2020   Weight loss 05/19/2020   Left arm pain 04/19/2020   Migraines 01/07/2020   Depression 08/23/2019   Spasticity 05/29/2019   Vitamin D deficiency 08/25/2018   Laceration of left hand 09/27/2017   Acute pharyngitis 07/14/2017   Chronic tonsillitis 07/12/2017   Chronic pain syndrome 06/22/2017   Bradycardia 06/22/2017   Abnormal ECG 06/22/2017   Former smoker  06/21/2016   Scalp pain 07/30/2015   Elevated blood-pressure reading, without diagnosis of hypertension 06/24/2015   Dandruff 06/24/2015   Lymphadenitis 06/24/2015   Burning with urination 01/25/2015   Balanitis 01/25/2015   Sleeping difficulties 02/04/2014    Bladder neck obstruction 02/04/2014   Hyperlipidemia 02/04/2014   Anterior spinal artery compression syndrome 10/10/2013   Other malaise and fatigue 07/24/2013   Stroke ([place]) 07/24/2013   Encounter for well adult exam with abnormal findings 07/24/2013   Neurologic gait disorder 07/24/2013   Other intervertebral disc displacement, lumbar region 05/29/2013   Cervical spondylosis with myelopathy 01/30/2013   Insomnia 10/12/2012   Generalized anxiety disorder 10/12/2012   Eczema 10/12/2012   History of stroke 10/12/2012   Corn or callus 07/07/2010   ED (erectile dysfunction) of organic origin 07/07/2010   Neurogenic bladder 06/10/2010   Unspecified urinary incontinence 06/10/2010   Hemiplegia ([place]) 06/09/2010     Daveda Larock L. [name], RN, BSN, [place] Coordinator Office number [phone] Mobile number [phone]  Main [facility] number [phone] Fax number [phone]

## 2021-02-14 NOTE — Progress Notes (Signed)
Office Visit Note  Patient: Danny Hopkins             Date of Birth: [birth year]           MRN: [id number]             PCP: Harlan Stains, MD Referring: Harlan Stains, MD Visit Date: 02/15/2021   Subjective:   History of Present Illness: Danny Hopkins is a 60 y.o. male here for follow up for peripheral vascular insufficiency with ulceration of the right 4th finger and was started on amlodipine 5 mg daily after negative workup of systemic vasculitis antibodies. Since our last visit fingers are doing overall better he is noticing increase in blue discoloration of 5th fingers on both hands with the colder weather. His right 3rd finger has developed a few spots with redness in the finger and nail. His follow up in primary care clinic has shown some borderline low BP on the amlodipine.  Previous HPI 11/16/20 Danny Hopkins is a 60 y.o. male here for follow up for digital ulcer of the right hand with vascular insufficiency of the hand suspicious for vasculitis. Labs at initial visit were all negative for antibody markers or for active inflammatory changes. Since our last visit finger wound continues slow healing improvement although remains slightly more swollen and tender than other digits. No new ulcerations or lesions.   Previous HPI: 11/02/20 Danny Hopkins is a 60 y.o. male here for evaluation for vasculitis with digital ulcer of right 4th finger. He has felt in overall usual health but had bene noticing cold and numbness changes in his fingertips for months affecting the right 3-5th fingers. He developed a wound on the tip of his 4th finger in June he does not remember a particular injury or maybe a small abrasion starting this. He did not think it was severe but subsequently developed black discoloration of his whole fingertip peeling of the skin. He saw Dr. Burney Gauze for evaluation and subsequent upper extremity arteriography with findings of no patent palmar arch and  diminished digital artery perfusions with normal proximal vasculature up to the wrist. Otherwise he denies feeling other significant symptom or medical changes lately. No skin rashes, lesions, no discoloration or ulceration of other digits or in the past. He has chronic lumbar spine degenerative arthritis with radiculopathy. He has chronic left sided residual weakness due to previous stroke. Workup for the stroke reportedly without any specific secondary underlying cause identified.   Labs reviewed 09/2020 CBC wnl BMP eGFR 48 UA contaminated, +Ca Oxalate, +glucose   Imaging reviewed 10/09/20 RUE arteriogram IMPRESSION:  Status post ultrasound guided access right common femoral artery for right upper extremity angiogram.  Findings reveal no significant atherosclerotic changes, with no evidence to suggest atheroembolic phenomenon as the source of the fourth digit ulcer, as the arteries are of normal course caliber and contour to the wrist.  The angiographic findings of the right hand are most likely a manifestation of either Buerger's disease (thrombo angiitis obliterans) or other connective tissue disorder/rheumatologic disorder. There was a mild improvement with administration of  nitroglycerin/vasodilators.   10/05/20 LE Vascular ultrasound IMPRESSION: 1. Normal bilateral resting ankle-brachial indices. 2. Markedly blunted digital waveforms bilaterally suggests small vessels disease.   06/23/20 CT Chest/Abdomen/Pelvis IMPRESSION: No significant abnormality is noted in the chest, abdomen or pelvis.   Review of Systems  Constitutional:  Negative for fatigue.  HENT:  Positive for mouth dryness.   Eyes:  Negative for dryness.  Respiratory:  Negative for shortness of breath.   Cardiovascular:  Negative for swelling in legs/feet.  Gastrointestinal:  Positive for constipation.  Endocrine: Negative for increased urination.  Genitourinary:  Negative for difficulty urinating.  Musculoskeletal:   Negative for morning stiffness.  Skin:  Negative for rash.  Allergic/Immunologic: Negative for susceptible to infections.  Neurological:  Negative for numbness.  Hematological:  Negative for bruising/bleeding tendency.  Psychiatric/Behavioral:  Negative for sleep disturbance.    PMFS History:  Patient Active Problem List   Diagnosis Date Noted   Raynaud's disease with gangrene ([place]) 02/15/2021   Anxiety 02/15/2021   Benign prostatic hyperplasia without lower urinary tract symptoms 02/15/2021   Constipation 02/15/2021   Dyslipidemia 02/15/2021   Hemiplegia of nondominant side as late effect of cerebrovascular disease ([place]) 02/15/2021   Therapeutic opioid induced constipation 01/21/2021   Rectal bleeding 01/21/2021   Internal hemorrhoids 01/21/2021   Vasculitis ([place]) 11/02/2020   Lumbar radiculopathy 09/17/2020   Allergic rhinitis 08/23/2020   Acute dysfunction of right eustachian tube 08/23/2020   Dysphagia 06/10/2020   Sore in nose 05/21/2020   Weight loss 05/19/2020   Left arm pain 04/19/2020   Migraines 01/07/2020   Depression 08/23/2019   Spasticity 05/29/2019   Vitamin D deficiency 08/25/2018   Laceration of left hand 09/27/2017   Acute pharyngitis 07/14/2017   Chronic tonsillitis 07/12/2017   Chronic pain syndrome 06/22/2017   Bradycardia 06/22/2017   Abnormal ECG 06/22/2017   Former smoker 06/21/2016   Scalp pain 07/30/2015   Elevated blood-pressure reading, without diagnosis of hypertension 06/24/2015   Dandruff 06/24/2015   Lymphadenitis 06/24/2015   Burning with urination 01/25/2015   Balanitis 01/25/2015   Sleeping difficulties 02/04/2014   Bladder neck obstruction 02/04/2014   Hyperlipidemia 02/04/2014   Anterior spinal artery compression syndrome 10/10/2013   Other malaise and fatigue 07/24/2013   Stroke ([place]) 07/24/2013   Encounter for well adult exam with abnormal findings 07/24/2013   Neurologic gait disorder 07/24/2013   Other intervertebral disc  displacement, lumbar region 05/29/2013   Cervical spondylosis with myelopathy 01/30/2013   Insomnia 10/12/2012   Generalized anxiety disorder 10/12/2012   Eczema 10/12/2012   History of stroke 10/12/2012   Corn or callus 07/07/2010   ED (erectile dysfunction) of organic origin 07/07/2010   Neurogenic bladder 06/10/2010   Unspecified urinary incontinence 06/10/2010   Hemiplegia ([place]) 06/09/2010    Past Medical History:  Diagnosis Date   Anxiety    07/2019   Arthritis    Depression 08/23/2019   History of chicken pox    Hyperlipidemia    2015   Neurologic gait disorder 07/24/2013   Chronic post stroke   Stroke [facility])    2006    Family History  Problem Relation Age of Onset   Healthy Sister    Healthy Sister    Healthy Brother    Healthy Brother    Stomach cancer Neg Hx    Rectal cancer Neg Hx    Pancreatic cancer Neg Hx    Colon cancer Neg Hx    Colon polyps Neg Hx    Esophageal cancer Neg Hx    Past Surgical History:  Procedure Laterality Date   cervical staph infection     2006   COLONOSCOPY     2015   INGUINAL HERNIA REPAIR     kindergarten   IR ANGIOGRAM EXTREMITY RIGHT  10/09/2020   IR INTRAVASCULAR ULTRASOUND NON CORONARY  10/13/2020   IR RADIOLOGIST EVAL & MGMT  09/24/2020   IR  RADIOLOGIST EVAL & MGMT  10/27/2020   IR US GUIDE VASC ACCESS RIGHT  10/09/2020   Social History   Social History Narrative   Regular exercise-no   Caffeine Use-no   Right handed 12/25/20 report he is not able to write his name well at all now   Immunization History  Administered Date(s) Administered   Influenza Inj Mdck Quad Pf 12/09/2017   Influenza Split 01/05/2021   Influenza,inj,Quad PF,6+ Mos 02/04/2014, 01/20/2015, 12/23/2015, 12/13/2016, 12/10/2018, 12/26/2020   Influenza-Unspecified 03/29/2011, 02/02/2012, 03/28/2018, 12/30/2019   PFIZER Comirnaty(Gray Top)Covid-19 Tri-Sucrose Vaccine 09/11/2020   PFIZER(Purple Top)SARS-COV-2 Vaccination 06/25/2019, 07/16/2019, 03/11/2020    Td 12/23/2015   Tdap 03/28/2009, 09/20/2017, 10/29/2018   Zoster Recombinat (Shingrix) 10/29/2018, 04/21/2019   Zoster, Live 10/29/2018, 04/21/2019     Objective: Vital Signs: BP 123/75 (BP Location: Left Arm, Patient Position: Sitting, Cuff Size: Small)   Pulse 65   Resp 12   Ht _0  (1.702 m)   Wt 130 lb 12.8 oz (59.3 kg)   BMI 20.49 kg/m    Physical Exam Eyes:     Conjunctiva/sclera: Conjunctivae normal.  Cardiovascular:     Rate and Rhythm: Normal rate and regular rhythm.  Pulmonary:     Effort: Pulmonary effort is normal.     Breath sounds: Normal breath sounds.  Musculoskeletal:     Right lower leg: No edema.     Left lower leg: No edema.  Skin:    General: Skin is warm and dry.     Comments: Right and left 5th digit distal segment cyanosis Right 3rd fingertip 25m diameter erythematous spot on tip, small hemorrhages or discoloration under the fingernail  Neurological:     Mental Status: He is alert.     Musculoskeletal Exam:  Wrists full ROM no tenderness or swelling Fingers full ROM no tenderness or swelling  No additional findings.  Imaging: No results found.  Recent Labs: Lab Results  Component Value Date   WBC 3.9 (L) 10/09/2020   HGB 14.9 10/09/2020   PLT 237 10/09/2020   NA 135 10/09/2020   K 4.2 10/09/2020   CL 104 10/09/2020   CO2 22 10/09/2020   GLUCOSE 92 10/09/2020   BUN 18 10/09/2020   CREATININE 1.64 (H) 10/09/2020   BILITOT 1.0 05/19/2020   ALKPHOS 74 05/19/2020   AST 14 05/19/2020   ALT 14 05/19/2020   PROT 6.5 11/02/2020   ALBUMIN 4.2 05/19/2020   CALCIUM 9.2 10/09/2020    Speciality Comments: No specialty comments available.  Procedures:  No procedures performed Allergies: Patient has no known allergies.   Assessment / Plan:     Visit Diagnoses: Raynaud's disease with gangrene ([place] - Plan: amLODipine (NORVASC) 10 MG tablet  Persistent cyanosis in small fingers there are changes probably mild ischemic injury lesions  in the middle finger. I reviewed about protecting his hands and core temperature such as coats and gloves. Will increase amlodipine to 10 mg daily cautioned again to watch out for side effects or any worsening hypotension with this. Recommended he can try topical hydrocortisone as needed for middle finger lesions or new lesions that may develop,  Vasculitis ([place] - Plan: amLODipine (NORVASC) 10 MG tablet  No new signs of systemic inflammatory disease or vasculitis changes since last visit.  Orders: No orders of the defined types were placed in this encounter.  Meds ordered this encounter  Medications   amLODipine (NORVASC) 10 MG tablet    Sig: Take 1 tablet (10 mg total) by  mouth daily.    Dispense:  90 tablet    Refill:  0      Follow-Up Instructions: Return in about 3 months (around 05/18/2021) for Raynaud/?Vasculitis on CCB f/u 34mo.   CCollier Salina MD  Note - This record has been created using [employer]  Chart creation errors have been sought, but may not always  have been located. Such creation errors do not reflect on  the standard of medical care.

## 2021-02-15 ENCOUNTER — Ambulatory Visit (INDEPENDENT_AMBULATORY_CARE_PROVIDER_SITE_OTHER): Payer: Medicare Other | Admitting: Internal Medicine

## 2021-02-15 ENCOUNTER — Encounter: Payer: Self-pay | Admitting: Internal Medicine

## 2021-02-15 ENCOUNTER — Other Ambulatory Visit: Payer: Self-pay

## 2021-02-15 VITALS — BP 123/75 | HR 65 | Resp 12 | Ht 67.0 in | Wt 130.8 lb

## 2021-02-15 DIAGNOSIS — I776 Arteritis, unspecified: Secondary | ICD-10-CM | POA: Diagnosis not present

## 2021-02-15 DIAGNOSIS — K5900 Constipation, unspecified: Secondary | ICD-10-CM | POA: Insufficient documentation

## 2021-02-15 DIAGNOSIS — F419 Anxiety disorder, unspecified: Secondary | ICD-10-CM | POA: Insufficient documentation

## 2021-02-15 DIAGNOSIS — N400 Enlarged prostate without lower urinary tract symptoms: Secondary | ICD-10-CM | POA: Insufficient documentation

## 2021-02-15 DIAGNOSIS — E785 Hyperlipidemia, unspecified: Secondary | ICD-10-CM | POA: Insufficient documentation

## 2021-02-15 DIAGNOSIS — I7301 Raynaud's syndrome with gangrene: Secondary | ICD-10-CM | POA: Diagnosis not present

## 2021-02-15 DIAGNOSIS — I69959 Hemiplegia and hemiparesis following unspecified cerebrovascular disease affecting unspecified side: Secondary | ICD-10-CM | POA: Insufficient documentation

## 2021-02-15 MED ORDER — AMLODIPINE BESYLATE 10 MG PO TABS: 10.0000 mg | ORAL_TABLET | Freq: Every day | ORAL | 0 refills | Status: DC

## 2021-02-15 NOTE — Patient Instructions (Signed)
Your hands look pretty good there is a small area of injury at the right middle finger and blue color in the small fingers.  The most important first step is avoid getting too cold either in your fingers or in core body temperature this protects the blood flow to your fingertips.  We can increase the amlodipine to 10 mg daily dose, you can take 2 of the previous tablets daily and I will send new prescription at the higher strength. Watch out for low blood pressure, leg swelling, dizziness, or increased headaches with this change if you feel worse can go back to the lower dose.  If your finger develops increased redness or skin peeling try starting topical steroid such as hydrocortisone twice daily on the affected area. If this keeps getting worse let us know with a call or message, I could recommend a stronger medicine take another look.

## 2021-02-24 ENCOUNTER — Other Ambulatory Visit: Payer: Self-pay

## 2021-02-24 ENCOUNTER — Other Ambulatory Visit: Payer: Self-pay | Admitting: *Deleted

## 2021-02-24 NOTE — Patient Outreach (Signed)
[name]  02/24/2021  Danny Hopkins March 27, 1961 [id number]   [facility] Unsuccessful outreach   Outreach attempt to the listed at the preferred outreach number in EPIC  No answer. [facility] RN CM left HIPAA [employer] Portability and Accountability Act) compliant voicemail message along with CM's contact info.   Plan: [facility] RN CM scheduled this patient for another call attempt within 4-7 business days Unsuccessful outreach on 02/24/21   [name] L. [name], RN, BSN, [place] Coordinator Office number [phone] Mobile number [phone]  Main [facility] number [phone] Fax number [phone]

## 2021-02-24 NOTE — Patient Outreach (Signed)
[facility] [facility]) Care Management Telephonic RN Care Manager Note   03/10/2021 Name:  Danny Hopkins MRN:  [id number] DOB:  1960-08-25  Summary: Follow up with patient Mopping his floor Sent in all medicaid forms  Go to Dss 02/25/21 To sign up for utility assistance on 02/25/21 Had to get a new used car this year Pain MD services on 12/6/2  Dr Annette Stable to be seen soon 03/10/21 Circle through pain medicine needed He continues to rest in between activities Left leg getting heavier and makes his back hurt after injections lower back pain decreased  Recommendations/Changes made from today's visit: Encouragement provided    Subjective: Danny Hopkins is an 60 y.o. year old male who is a primary patient of Harlan Stains, MD. The care management team was consulted for assistance with care management and/or care coordination needs.    Telephonic RN Care Manager completed Telephone Visit today.   Objective:  Medications Reviewed Today     Reviewed by Carole Binning, LPN (Licensed Practical Nurse) on 02/15/21 at 24  Med List Status: <None>   Medication Order Taking? Sig Documenting Provider Last Dose Status Informant  ALPRAZolam (XANAX) 1 MG tablet [id number] Yes TAKE 1 TABLET IN THE MORNING AND 1 AND 1/2 TABLETS IN THE EVENING AS NEEDED Biagio Borg, MD Taking Active   amLODipine (NORVASC) 5 MG tablet [id number] Yes TAKE 1 TABLET (5 MG TOTAL) BY MOUTH DAILY. Collier Salina, MD Taking Active   atorvastatin (LIPITOR) 20 MG tablet [id number] Yes TAKE 1 TABLET BY MOUTH EVERY DAY  Patient taking differently: Take 20 mg by mouth daily.   Biagio Borg, MD Taking Active   baclofen (LIORESAL) 10 MG tablet [id number] Yes Take 10 mg by mouth daily as needed (Back spasms). [provider] Taking Active Self  Cholecalciferol (VITAMIN D3) 50 MCG (2000 UT) TABS [id number] Yes Take 2,000 Units by mouth daily. [provider] Taking Active Self   clotrimazole-betamethasone (LOTRISONE) cream [id number] Yes APPLY TO AFFECTED AREA TWICE A DAY Biagio Borg, MD Taking Active   docusate sodium (COLACE) 100 MG capsule [id number] Yes Take 200 mg by mouth 2 (two) times daily. [provider] Taking Active Self  fluticasone (FLONASE) 50 MCG/ACT nasal spray [id number] Yes Place 2 sprays into both nostrils daily. [provider] Taking Active Self  HYDROcodone-acetaminophen ([place]) 10-325 MG per tablet [id number] Yes Take 1 tablet by mouth every 6 (six) hours. [provider] Taking Active Self  hydrocortisone (ANUSOL-HC) 2.5 % rectal cream [id number] Yes Apply 1 application topically 2 (two) times daily. [provider] Taking Active   MOVANTIK 25 MG TABS tablet [id number] Yes Take 1 tablet (25 mg total) by mouth every morning. Zehr, Laban Emperor, PA-C Taking Active   naproxen (NAPROSYN) 500 MG tablet [id number] Yes TAKE 1 TABLET (500 MG TOTAL) BY MOUTH 2 (TWO) TIMES DAILY AS NEEDED FOR HEADACHE. Biagio Borg, MD Taking Active Self  omeprazole (PRILOSEC) 20 MG capsule [id number] Yes TAKE 1 CAPSULE BY MOUTH DAILY AS NEEDED. Janith Lima, MD Taking Active   SUMAtriptan (IMITREX) 100 MG tablet [id number] No Take 1 tablet (100 mg total) by mouth every 2 (two) hours as needed for migraine or headache. May repeat in 2 hours if headache persists or recurs.  Patient not taking: Reported on 02/15/2021   Biagio Borg, MD Not Taking Active Self  tamsulosin [facility]) 0.4 MG CAPS capsule [id number] Yes TAKE 1 CAPSULE BY MOUTH EVERY  DAY  Patient taking differently: Take 0.4 mg by mouth daily.   Biagio Borg, MD Taking Active              SDOH:  (Social Determinants of Health) assessments and interventions performed:    Care Plan  Review of patient past medical history, allergies, medications, health status, including review of consultants reports, laboratory and other test data, was performed as part of comprehensive  evaluation for care management services.   Care Plan : Nonspecific Low Back Pain (Adult)  Updates made by Barbaraann Faster, RN since 03/10/2021 12:00 AM  Completed 03/10/2021   Care Plan : General Plan of Care (Adult)  Updates made by Barbaraann Faster, RN since 03/10/2021 12:00 AM  Completed 03/10/2021     Plan: The patient has been provided with contact information for the care management team and has been advised to call with any health related questions or concerns.  The care management team will reach out to the patient again over the next 30 business  days.  Aubery Date L. [name], RN, BSN, [place] Coordinator Office number [phone] Main [facility] number [phone] Fax number [phone]

## 2021-02-28 ENCOUNTER — Other Ambulatory Visit: Payer: Self-pay | Admitting: Internal Medicine

## 2021-02-28 NOTE — Telephone Encounter (Signed)
Please refill as per office routine med refill policy (all routine meds to be refilled for 3 mo or monthly (per pt preference) up to one year from last visit, then month to month grace period for 3 mo, then further med refills will have to be denied) ? ?

## 2021-03-02 DIAGNOSIS — G894 Chronic pain syndrome: Secondary | ICD-10-CM | POA: Diagnosis not present

## 2021-03-02 DIAGNOSIS — R252 Cramp and spasm: Secondary | ICD-10-CM | POA: Diagnosis not present

## 2021-03-02 DIAGNOSIS — M4712 Other spondylosis with myelopathy, cervical region: Secondary | ICD-10-CM | POA: Diagnosis not present

## 2021-03-02 DIAGNOSIS — M5416 Radiculopathy, lumbar region: Secondary | ICD-10-CM | POA: Diagnosis not present

## 2021-03-10 ENCOUNTER — Other Ambulatory Visit: Payer: Self-pay | Admitting: *Deleted

## 2021-03-10 NOTE — Patient Outreach (Signed)
[facility]) Care Management Telephonic RN Care Manager Note   03/10/2021 Name:  Danny Hopkins MRN:  [id number] DOB:  Oct 09, 1960  Summary: [place] coordination and follow up Pt called RN CM but with difficulties with audible line  RN CM returned his call Pt voices concern about possible medicaid services ad personal care services He reports various calls to [place] today with frustration with understanding possible available resources RN CM assisted with a conference call to [place] [phone] to get clarity on his medicaid letter and personal care services He is not able to find the DSS letter sent to his home with the contact number listed   Recommendations/Changes made from today's visit: Encouragement provided for the process of pending medicaid and personal care services approval Encouraged him to inquire of Abbi Gill if she call him if he has the type of medicaid that will allow him to get personal care services and if not how can personal care services be obtained (If possible)    Subjective: Danny Hopkins is an 59 y.o. year old male who is a primary patient of Harlan Stains, MD. The care management team was consulted for assistance with care management and/or care coordination needs.    Telephonic RN Care Manager completed Telephone Visit today.   Objective:  Medications Reviewed Today     Reviewed by Carole Binning, LPN (Licensed Practical Nurse) on 02/15/21 at 50  Med List Status: <None>   Medication Order Taking? Sig Documenting Provider Last Dose Status Informant  ALPRAZolam (XANAX) 1 MG tablet [id number] Yes TAKE 1 TABLET IN THE MORNING AND 1 AND 1/2 TABLETS IN THE EVENING AS NEEDED Biagio Borg, MD Taking Active   amLODipine (NORVASC) 5 MG tablet [id number] Yes TAKE 1 TABLET (5 MG TOTAL) BY MOUTH DAILY. Collier Salina, MD Taking Active   atorvastatin (LIPITOR) 20 MG tablet [id number] Yes TAKE 1 TABLET BY MOUTH EVERY DAY  Patient  taking differently: Take 20 mg by mouth daily.   Biagio Borg, MD Taking Active   baclofen (LIORESAL) 10 MG tablet [id number] Yes Take 10 mg by mouth daily as needed (Back spasms). [provider] Taking Active Self  Cholecalciferol (VITAMIN D3) 50 MCG (2000 UT) TABS [id number] Yes Take 2,000 Units by mouth daily. [provider] Taking Active Self  clotrimazole-betamethasone (LOTRISONE) cream [id number] Yes APPLY TO AFFECTED AREA TWICE A DAY Biagio Borg, MD Taking Active   docusate sodium (COLACE) 100 MG capsule [id number] Yes Take 200 mg by mouth 2 (two) times daily. [provider] Taking Active Self  fluticasone (FLONASE) 50 MCG/ACT nasal spray [id number] Yes Place 2 sprays into both nostrils daily. [provider] Taking Active Self  HYDROcodone-acetaminophen ([place]) 10-325 MG per tablet [id number] Yes Take 1 tablet by mouth every 6 (six) hours. [provider] Taking Active Self  hydrocortisone (ANUSOL-HC) 2.5 % rectal cream [id number] Yes Apply 1 application topically 2 (two) times daily. [provider] Taking Active   MOVANTIK 25 MG TABS tablet [id number] Yes Take 1 tablet (25 mg total) by mouth every morning. Zehr, Laban Emperor, PA-C Taking Active   naproxen (NAPROSYN) 500 MG tablet [id number] Yes TAKE 1 TABLET (500 MG TOTAL) BY MOUTH 2 (TWO) TIMES DAILY AS NEEDED FOR HEADACHE. Biagio Borg, MD Taking Active Self  omeprazole (PRILOSEC) 20 MG capsule [id number] Yes TAKE 1 CAPSULE BY MOUTH DAILY AS NEEDED. Janith Lima, MD Taking Active   SUMAtriptan (IMITREX) 100 MG tablet  [id number] No Take 1 tablet (100 mg total) by mouth every 2 (two) hours as needed for migraine or headache. May repeat in 2 hours if headache persists or recurs.  Patient not taking: Reported on 02/15/2021   Biagio Borg, MD Not Taking Active Self  tamsulosin [facility]) 0.4 MG CAPS capsule [id number] Yes TAKE 1 CAPSULE BY MOUTH EVERY DAY  Patient taking differently: Take 0.4 mg  by mouth daily.   Biagio Borg, MD Taking Active              SDOH:  (Social Determinants of Health) assessments and interventions performed:    Care Plan  Review of patient past medical history, allergies, medications, health status, including review of consultants reports, laboratory and other test data, was performed as part of comprehensive evaluation for care management services.   Care Plan : Nonspecific Low Back Pain (Adult)  Updates made by Barbaraann Faster, RN since 03/10/2021 12:00 AM  Completed 03/10/2021   Care Plan : General Plan of Care (Adult)  Updates made by Barbaraann Faster, RN since 03/10/2021 12:00 AM  Completed 03/10/2021   Care Plan : RN Care Manager Plan of Care  Updates made by Barbaraann Faster, RN since 03/10/2021 12:00 AM     Problem: Complex Care Coordination Needs and disease management in patient with chronic pain, stroke, mobility concerns   Priority: High     Long-Range Goal: Establish Plan of Care for Management Complex SDOH Barriers, disease management and Care Coordination Needs in patient with chronic pain, stroke, mobility concerns   Start Date: 02/10/2021  This Visit's Progress: On track  Recent Progress: On track  Priority: High  Note:   Current Barriers:  Knowledge Deficits related to plan of care for management of chronic pain, stroke, mobility concerns Care Coordination needs related to ADL IADL limitations, Limited education about chronic pain, stroke, mobility concerns*, and Lacks knowledge of community resource: chronic pain, stroke, mobility concerns Mobility, health behaviors Interest in personal care services -has completed medicaid application pending response, various call attempts to social services unsuccessfully  RNCM Clinical Goal(s):  Patient will verbalize understanding of plan for management of chronic pain, stroke, mobility concerns work with [place]  to complete medicaid application  through  collaboration with Consulting civil engineer, provider, and care team. - Goal met 03/10/21 confirms completion of medicaid application pending approval response  Interventions: Follow up with patient as agree to re assess needs Inter-disciplinary care team collaboration (see longitudinal plan of care) Evaluation of current treatment plan related to  self management and patient's adherence to plan as established by provider 03/10/21 Assist with conference and non conference outreach to [place] for medicaid application status.  RN CM left a message for Abbi Gill at [phone] listed as th medicaid review manager Requesting the status of pt medicaid application and possible assist with personal care services Discussed extend wait time with calling main DSS line and left pt and RN CM numbers  RN CM reviewed with pt the process of completing medicaid application, pending approval time of 30-45 days then personal care services can be applied for if he is approved for full medicaid Used teach back method to clarify that he understood Physicist, medical (DSS Staff) response about the recent letter he received. This letter stated he has medicaid that pays his [place] and his recent medicaid application was closed without listing if medicaid covers personal care services. Blanche sent a message for a DSS  staff to outreach to RN CM for more clarity and questions.  Explained to him the importance of him asking Abbi Gordy Levan about his specific medicaid and if it covers personal care services if she calls him He voices understanding  Pain Interventions: Goal on track: Yes Pain assessment performed Medications reviewed Reviewed provider established plan for pain management; Discussed importance of adherence to all scheduled medical appointments; Counseled on the importance of reporting any/all new or changed pain symptoms or management strategies to pain management provider; Advised patient to report to care team  affect of pain on daily activities; Discussed use of relaxation techniques and/or diversional activities to assist with pain reduction (distraction, imagery, relaxation, massage, acupressure, TENS, heat, and cold application; Screening for signs and symptoms of depression related to chronic disease state;   Stroke with mobility concernsGoal on track:  Yes. Evaluation of current treatment plan related to  stroke, mobility concerns , ADL IADL limitations, Limited education about stroke, mobility concerns*, and Lacks knowledge of community resource: medicaid  self-management and patient's adherence to plan as established by provider. Discussed plans with patient for ongoing care management follow up and provided patient with direct contact information for care management team Provided education to patient re: stroke ; Collaborated with DSS regarding medicaid application; Discussed plans with patient for ongoing care management follow up and provided patient with direct contact information for care management team; Assessed social determinant of health barriers;   Patient Goals/Self-Care Activities: Patient will self administer medications as prescribed Patient will attend all scheduled provider appointments Patient will call pharmacy for medication refills Patient will attend church or other social activities Patient will continue to perform ADL's independently Patient will continue to perform IADL's independently Patient will call provider office for new concerns or questions  Follow Up Plan:  The patient has been provided with contact information for the care management team and has been advised to call with any health related questions or concerns.  The care management team will reach out to the patient again over the next 30 business  days.       Plan: The patient has been provided with contact information for the care management team and has been advised to call with any health related  questions or concerns.  The care management team will reach out to the patient again over the next 30 business  days.  Saya Mccoll L. [name], RN, BSN, [place] Coordinator Office number [phone] Main [facility] number [phone] Fax number [phone]

## 2021-03-11 DIAGNOSIS — M4712 Other spondylosis with myelopathy, cervical region: Secondary | ICD-10-CM | POA: Diagnosis not present

## 2021-03-15 ENCOUNTER — Other Ambulatory Visit: Payer: Self-pay | Admitting: *Deleted

## 2021-03-15 NOTE — Patient Outreach (Signed)
[facility]) Care Management Telephonic RN Care Manager Note   03/15/2021 Name:  Danny Hopkins MRN:  [id number] DOB:  1961/01/03  Summary: Mr Vantrease left a message for RN CM  RN CM returned a call to Mr Akhavan to assist with him understanding a second letter he reports he has received related to medicaid.  He read the letter to RN CM to indicate he has full medicaid and the application he recently completed would be closed   Recommendations/Changes made from today's visit: Outreach with patient to reported assigned DSS staff Mrs [name] unsuccessful, voice message left.     Subjective: Danny Hopkins is an 60 y.o. year old male who is a primary patient of Harlan Stains, MD. The care management team was consulted for assistance with care management and/or care coordination needs.    Telephonic RN Care Manager completed Telephone Visit today.   Objective:  Medications Reviewed Today     Reviewed by Carole Binning, LPN (Licensed Practical Nurse) on 02/15/21 at 33  Med List Status: <None>   Medication Order Taking? Sig Documenting Provider Last Dose Status Informant  ALPRAZolam (XANAX) 1 MG tablet [id number] Yes TAKE 1 TABLET IN THE MORNING AND 1 AND 1/2 TABLETS IN THE EVENING AS NEEDED Biagio Borg, MD Taking Active   amLODipine (NORVASC) 5 MG tablet [id number] Yes TAKE 1 TABLET (5 MG TOTAL) BY MOUTH DAILY. Collier Salina, MD Taking Active   atorvastatin (LIPITOR) 20 MG tablet [id number] Yes TAKE 1 TABLET BY MOUTH EVERY DAY  Patient taking differently: Take 20 mg by mouth daily.   Biagio Borg, MD Taking Active   baclofen (LIORESAL) 10 MG tablet [id number] Yes Take 10 mg by mouth daily as needed (Back spasms). [provider] Taking Active Self  Cholecalciferol (VITAMIN D3) 50 MCG (2000 UT) TABS [id number] Yes Take 2,000 Units by mouth daily. [provider] Taking Active Self  clotrimazole-betamethasone (LOTRISONE) cream [id number]  Yes APPLY TO AFFECTED AREA TWICE A DAY Biagio Borg, MD Taking Active   docusate sodium (COLACE) 100 MG capsule [id number] Yes Take 200 mg by mouth 2 (two) times daily. [provider] Taking Active Self  fluticasone (FLONASE) 50 MCG/ACT nasal spray [id number] Yes Place 2 sprays into both nostrils daily. [provider] Taking Active Self  HYDROcodone-acetaminophen ([place]) 10-325 MG per tablet [id number] Yes Take 1 tablet by mouth every 6 (six) hours. [provider] Taking Active Self  hydrocortisone (ANUSOL-HC) 2.5 % rectal cream [id number] Yes Apply 1 application topically 2 (two) times daily. [provider] Taking Active   MOVANTIK 25 MG TABS tablet [id number] Yes Take 1 tablet (25 mg total) by mouth every morning. Zehr, Laban Emperor, PA-C Taking Active   naproxen (NAPROSYN) 500 MG tablet [id number] Yes TAKE 1 TABLET (500 MG TOTAL) BY MOUTH 2 (TWO) TIMES DAILY AS NEEDED FOR HEADACHE. Biagio Borg, MD Taking Active Self  omeprazole (PRILOSEC) 20 MG capsule [id number] Yes TAKE 1 CAPSULE BY MOUTH DAILY AS NEEDED. Janith Lima, MD Taking Active   SUMAtriptan (IMITREX) 100 MG tablet [id number] No Take 1 tablet (100 mg total) by mouth every 2 (two) hours as needed for migraine or headache. May repeat in 2 hours if headache persists or recurs.  Patient not taking: Reported on 02/15/2021   Biagio Borg, MD Not Taking Active Self  tamsulosin [facility]) 0.4 MG CAPS capsule [id number] Yes TAKE 1 CAPSULE BY MOUTH EVERY DAY  Patient taking differently: Take 0.4 mg  by mouth daily.   Biagio Borg, MD Taking Active              SDOH:  (Social Determinants of Health) assessments and interventions performed:    Care Plan  Review of patient past medical history, allergies, medications, health status, including review of consultants reports, laboratory and other test data, was performed as part of comprehensive evaluation for care management services.   Care Plan : RN Care  Manager Plan of Care  Updates made by Barbaraann Faster, RN since 03/15/2021 12:00 AM     Problem: Complex Care Coordination Needs and disease management in patient with chronic pain, stroke, mobility concerns   Priority: High     Long-Range Goal: Establish Plan of Care for Management Complex SDOH Barriers, disease management and Care Coordination Needs in patient with chronic pain, stroke, mobility concerns   Start Date: 02/10/2021  Recent Progress: On track  Priority: High  Note:   Current Barriers:  Knowledge Deficits related to plan of care for management of chronic pain, stroke, mobility concerns Care Coordination needs related to ADL IADL limitations, Limited education about chronic pain, stroke, mobility concerns*, and Lacks knowledge of community resource: chronic pain, stroke, mobility personal care services concerns Mobility, health behaviors Interest in personal care services -has completed medicaid application pending response, various call attempts to social services unsuccessfully 03/15/21 Understanding of DSS forms/letters  RN CM Clinical Goal(s):  Patient will verbalize understanding of plan for management of chronic pain, stroke, mobility concerns work with [place]  to complete medicaid application  through collaboration with Consulting civil engineer, provider, and care team. - Goal met 03/10/21 confirms completion of medicaid application pending approval response 03/15/21 patient will verbalize understanding of DSS letters and be connected with DSS staff for further resources  Interventions: Follow up with patient as agree to re assess needs Inter-disciplinary care team collaboration (see longitudinal plan of care) Evaluation of current treatment plan related to  self management and patient's adherence to plan as established by provider 03/10/21 Assist with conference and non conference outreach to [place] for medicaid application status.  RN CM left a message  for Abbi Gill at [phone] listed as th medicaid review manager Requesting the status of pt medicaid application and possible assist with personal care services Discussed extend wait time with calling main DSS line and left pt and RN CM numbers  Used teach back method to clarify that he understood the second recent letter he received. This letter stated he has full medicaid and his recent medicaid application was closed. RN CM and patient completed a conference call to leave a message for Mrs [name] (reported by Mrs Rosita Fire to be patient's assigned DSS staff) for more clarity,  questions and assist with personal care services.  Explained to him the importance of him asking Mrs [name] about his specific medicaid and if it covers personal care services if she calls him He voices understanding  Pain Interventions: Condition stable.  Not addressed this visit. Pain assessment performed Medications reviewed Reviewed provider established plan for pain management; Discussed importance of adherence to all scheduled medical appointments; Counseled on the importance of reporting any/all new or changed pain symptoms or management strategies to pain management provider; Advised patient to report to care team affect of pain on daily activities; Discussed use of relaxation techniques and/or diversional activities to assist with pain reduction (distraction, imagery, relaxation, massage, acupressure, TENS, heat, and cold application; Screening for signs and symptoms  of depression related to chronic disease state;   Stroke with mobility concernsCondition stable.  Not addressed this visit. Evaluation of current treatment plan related to  stroke, mobility concerns , ADL IADL limitations, Limited education about stroke, mobility concerns*, and Lacks knowledge of community resource: medicaid  self-management and patient's adherence to plan as established by provider. Discussed plans with patient for ongoing care  management follow up and provided patient with direct contact information for care management team Provided education to patient re: stroke ; Collaborated with DSS regarding medicaid application; Discussed plans with patient for ongoing care management follow up and provided patient with direct contact information for care management team; Assessed social determinant of health barriers;   Patient Goals/Self-Care Activities: Patient will self administer medications as prescribed Patient will attend all scheduled provider appointments Patient will call pharmacy for medication refills Patient will attend church or other social activities Patient will continue to perform ADL's independently Patient will continue to perform IADL's independently Patient will call provider office for new concerns or questions  Follow Up Plan:  The patient has been provided with contact information for the care management team and has been advised to call with any health related questions or concerns.  The care management team will reach out to the patient again over the next 30 business  days.       Plan: The patient has been provided with contact information for the care management team and has been advised to call with any health related questions or concerns.  The care management team will reach out to the patient again over the next 30 business days.  Jamond Neels L. [name], RN, BSN, [place] Coordinator Office number [phone] Main [facility] number [phone] Fax number [phone]

## 2021-03-19 ENCOUNTER — Other Ambulatory Visit: Payer: Self-pay | Admitting: *Deleted

## 2021-03-19 NOTE — Patient Outreach (Signed)
[facility]) Care Management  03/19/2021  Danny Hopkins 03-11-1961 [id number]   [facility] Care coordination- Follow up personal care services   Patient left RN Cm a message on 03/18/21 stating he has not had success with a return outreach from [place] related to personal care services   RN CM attempted another outreach to Mrs [name] at [facility] for pt related to personal care services [phone]  No answer. [facility] RN CM left HIPAA [facility] Portability [facility] Accountability Act) compliant voicemail message along with CM's contact info and pt's number for a return call  RN CM outreached to patient to update him that RN CM had not received a return call also from [place] He reports he left a message every day this week without success.  Discussed a possible inquiry of [facility] SW about personal care services  Plan [facility] RN CM will follow up with patient within the next 30 business days Care Plan : RN Care Manager Plan of Care  Updates made by Barbaraann Faster, RN since 03/19/2021 12:00 AM     Problem: Complex Care Coordination Needs and disease management in patient with chronic pain, stroke, mobility concerns   Priority: High     Long-Range Goal: Establish Plan of Care for Management Complex SDOH Barriers, disease management and Care Coordination Needs in patient with chronic pain, stroke, mobility concerns   Start Date: 02/10/2021  Recent Progress: On track  Priority: High  Note:   Current Barriers:  Knowledge Deficits related to plan of care for management of chronic pain, stroke, mobility concerns Care Coordination needs related to ADL IADL limitations, Limited education about chronic pain, stroke, mobility concerns*, and Lacks knowledge of community resource: chronic pain, stroke, mobility personal care services concerns Mobility, health behaviors Interest in personal care services -has completed medicaid application pending response, various call attempts to social services  unsuccessfully 03/15/21 Understanding of DSS forms/letters  RN CM Clinical Goal(s):  Patient will verbalize understanding of plan for management of chronic pain, stroke, mobility concerns work with [place]  to complete medicaid application  through collaboration with Consulting civil engineer, provider, and care team. - Goal met 03/10/21 confirms completion of medicaid application pending approval response 03/15/21 patient will verbalize understanding of DSS letters and be connected with DSS staff for further resources  Interventions: Follow up with patient as agree to re assess needs Inter-disciplinary care team collaboration (see longitudinal plan of care) Evaluation of current treatment plan related to  self management and patient's adherence to plan as established by provider 03/10/21 Assist with conference and non conference outreach to [place] for medicaid application status.  RN CM left a message for Abbi Gill at [phone] listed as th medicaid review manager Requesting the status of pt medicaid application and possible assist with personal care services Discussed extend wait time with calling main DSS line and left pt and RN CM numbers  Used teach back method to clarify that he understood the second recent letter he received. This letter stated he has full medicaid and his recent medicaid application was closed. RN CM and patient completed a conference call to leave a message for Mrs [name] (reported by Mrs Rosita Fire to be patient's assigned DSS staff) for more clarity,  questions and assist with personal care services.  Explained to him the importance of him asking Mrs [name] about his specific medicaid and if it covers personal care services if she calls him He voices understanding 03/19/21 another  message left for DSS staff, [place], Follow up with patient, updated him  Pain Interventions: Condition stable.  Not addressed this visit. Pain assessment performed Medications  reviewed Reviewed provider established plan for pain management; Discussed importance of adherence to all scheduled medical appointments; Counseled on the importance of reporting any/all new or changed pain symptoms or management strategies to pain management provider; Advised patient to report to care team affect of pain on daily activities; Discussed use of relaxation techniques and/or diversional activities to assist with pain reduction (distraction, imagery, relaxation, massage, acupressure, TENS, heat, and cold application; Screening for signs and symptoms of depression related to chronic disease state;   Stroke with mobility concernsCondition stable.  Not addressed this visit. Evaluation of current treatment plan related to  stroke, mobility concerns , ADL IADL limitations, Limited education about stroke, mobility concerns*, and Lacks knowledge of community resource: medicaid  self-management and patient's adherence to plan as established by provider. Discussed plans with patient for ongoing care management follow up and provided patient with direct contact information for care management team Provided education to patient re: stroke ; Collaborated with DSS regarding medicaid application; Discussed plans with patient for ongoing care management follow up and provided patient with direct contact information for care management team; Assessed social determinant of health barriers;   Patient Goals/Self-Care Activities: Patient will self administer medications as prescribed Patient will attend all scheduled provider appointments Patient will call pharmacy for medication refills Patient will attend church or other social activities Patient will continue to perform ADL's independently Patient will continue to perform IADL's independently Patient will call provider office for new concerns or questions  Follow Up Plan:  The patient has been provided with contact information for the care  management team and has been advised to call with any health related questions or concerns.  The care management team will reach out to the patient again over the next 30 business  days.       Ida Uppal L. [name], RN, BSN, [place] Coordinator Office number [phone] Mobile number [phone]  Main [facility] number [phone] Fax number [phone]

## 2021-03-24 ENCOUNTER — Ambulatory Visit: Payer: Medicare Other | Admitting: *Deleted

## 2021-03-30 DIAGNOSIS — I69354 Hemiplegia and hemiparesis following cerebral infarction affecting left non-dominant side: Secondary | ICD-10-CM | POA: Diagnosis not present

## 2021-03-30 DIAGNOSIS — M5136 Other intervertebral disc degeneration, lumbar region: Secondary | ICD-10-CM | POA: Diagnosis not present

## 2021-04-09 ENCOUNTER — Encounter: Payer: Self-pay | Admitting: *Deleted

## 2021-04-09 ENCOUNTER — Other Ambulatory Visit: Payer: Self-pay | Admitting: *Deleted

## 2021-04-09 NOTE — Patient Outreach (Addendum)
[facility]) Care Management  04/09/2021  Danny Hopkins Dec 14, 1960 [id number]   [facility] care coordination- personal care services   Spoke with patient who reports he did receive a return call from a DSS staff member to reports it may take 3-4 weeks for him to get services and encouraged him to inquire about "PCS" at his pcp office. He has not outreached to pcp office and agreed for RN CM to outreach He reports worsening mobility. He shares that on 04/08/21 pm he left his urinal a distance from him and had to struggle almost crawling to get it  Spoke with Amy then Enid Derry in the referral department of [facility] office [phone] Pt had an office pcp visit on 03/30/21 and a referral was put in for PT/OT per Enid Derry  She inquired if [facility] had started services. RN CM informed Enid Derry this was the first RN CM had heard about [facility] and had only spoken with the pt briefly today before outreaching to pcp office.    Enid Derry inquired of pt's medicaid card and request pt to bring medicaid card to office No scanned medicaid card noted in [facility]  RN CM returned a call to pt  RN CM updated pt of outreach to [place] to include the discussion of [facility] and medicaid needed for Personal care services (PCS) aide Danny Hopkins confirms his medicaid is NOT full medicaid but only assists with paying for his medications.  He discusses speaking with CAP/DSS staff about possible process for full medicaid and will speak with CAP/DSS staff again next week He voice reluctance for use of [facility] services. RN CM discussed the importance of HH PT/OT in mobility and home care management. He reports he will consider it but was still unsure.  He denies pain but reports " I just can't move like I use to"  Care Plan : [place] of Care  Updates made by Barbaraann Faster, RN since 04/09/2021 12:00 AM     Problem: Complex Care Coordination Needs and disease management in patient with  chronic pain, stroke, mobility concerns   Priority: High     Long-Range Goal: Establish Plan of Care for Management Complex SDOH Barriers, disease management and Care Coordination Needs in patient with chronic pain, stroke, mobility concerns   Start Date: 02/10/2021  This Visit's Progress: On track  Recent Progress: On track  Priority: High  Note:   Current Barriers:  Knowledge Deficits related to plan of care for management of chronic pain, stroke, mobility concerns Care Coordination needs related to ADL IADL limitations, Limited education about chronic pain, stroke, mobility concerns*, and Lacks knowledge of community resource: chronic pain, stroke, mobility personal care services concerns Mobility, health behaviors Interest in personal care services -has completed medicaid application pending response, various call attempts to social services unsuccessfully 03/15/21 Understanding of DSS forms/letters/medicaid process  RN CM Clinical Goal(s):  Patient will verbalize understanding of plan for management of chronic pain, stroke, mobility concerns work with [place]  to complete medicaid application  through collaboration with Consulting civil engineer, provider, and care team. - Goal met 03/10/21 confirms completion of medicaid application pending approval response 03/15/21 patient will verbalize understanding of DSS letters and be connected with DSS staff for further resources  Interventions: Follow up with patient as agree to re assess needs Inter-disciplinary care team collaboration (see longitudinal plan of care) Evaluation of current treatment plan related to  self management and patient's  adherence to plan as established by provider 03/10/21 Assist with conference and non conference outreach to [place] for medicaid application status.  RN CM left a message for Abbi Gill at [phone] listed as th medicaid review manager Requesting the status of pt medicaid application and  possible assist with personal care services Discussed extend wait time with calling main DSS line and left pt and RN CM numbers  Used teach back method to clarify that he understood the second recent letter he received. This letter stated he has full medicaid and his recent medicaid application was closed. RN CM and patient completed a conference call to leave a message for Mrs [name] (reported by Mrs Rosita Fire to be patient's assigned DSS staff) for more clarity,  questions and assist with personal care services.  Explained to him the importance of him asking Mrs [name] about his specific medicaid and if it covers personal care services if she calls him He voices understanding 03/19/21 another message left for DSS staff, [place], Follow up with patient, updated him 04/09/21 care coordination with pcp referral staff Enid Derry about [facility], PCS , updated patient, encourage him to continue outreaches/interventions with DSS/CAPs staff Educated him again he has to have full medicaid to get PCS, or pay out of pocket for PCS but has option of UHC covered PT/OT limited sessions  Pain Interventions: on track yes Pain assessment performed Medications reviewed Reviewed provider established plan for pain management; Discussed importance of adherence to all scheduled medical appointments; Counseled on the importance of reporting any/all new or changed pain symptoms or management strategies to pain management provider; Advised patient to report to care team affect of pain on daily activities; Discussed use of relaxation techniques and/or diversional activities to assist with pain reduction (distraction, imagery, relaxation, massage, acupressure, TENS, heat, and cold application; Screening for signs and symptoms of depression related to chronic disease state;   Stroke with mobility concernsGoal on track:  Yes. Evaluation of current treatment plan related to  stroke, mobility concerns , ADL IADL limitations, Limited  education about stroke, mobility concerns*, and Lacks knowledge of community resource: medicaid  self-management and patient's adherence to plan as established by provider. Discussed plans with patient for ongoing care management follow up and provided patient with direct contact information for care management team Provided education to patient re: stroke ; Collaborated with DSS regarding medicaid application; Discussed plans with patient for ongoing care management follow up and provided patient with direct contact information for care management team; Assessed social determinant of health barriers;   Patient Goals/Self-Care Activities: Patient will self administer medications as prescribed Patient will attend all scheduled provider appointments Patient will call pharmacy for medication refills Patient will attend church or other social activities Patient will continue to perform ADL's independently Patient will continue to perform IADL's independently Patient will call provider office for new concerns or questions  Follow Up Plan:  The patient has been provided with contact information for the care management team and has been advised to call with any health related questions or concerns.  The care management team will reach out to the patient again over the next 30 business  days.      Plans  RN CM will outreach to pt within the next 30 business days  [place]. [name], RN, BSN, [place] Coordinator Office number [phone] Main [facility] number [phone] Fax number [phone]

## 2021-04-13 ENCOUNTER — Other Ambulatory Visit: Payer: Self-pay | Admitting: *Deleted

## 2021-04-13 NOTE — Patient Outreach (Signed)
[facility]) Care Management Telephonic RN Care Manager Note   04/13/2021 Name:  Danny Hopkins MRN:  [id number] DOB:  [birth year]  Summary: Danny Hopkins left a message RN CM returned a call to him He inquired about the name of the agency his pcp office was working on to assist him He states DSS/CAP staff outreached to him and will send forms to him to complete  Recommendations/Changes made from today's visit: RN CM updated him of the agency [facility] ([facility]viously [place])  Questions answered for him He was encouraged to give the agency an opportunity to work with him and to continue to see his pain management providers  RN CM inquired of an update on his outreaches with DSS   Subjective: Danny Hopkins is an 61 y.o. year old male who is a primary patient of Harlan Stains, MD. The care management team was consulted for assistance with care management and/or care coordination needs.    Telephonic RN Care Manager completed Telephone Visit today.   Objective:  Medications Reviewed Today     Reviewed by Carole Binning, LPN (Licensed Practical Nurse) on 02/15/21 at 61  Med List Status: <None>   Medication Order Taking? Sig Documenting Provider Last Dose Status Informant  ALPRAZolam (XANAX) 1 MG tablet [id number] Yes TAKE 1 TABLET IN THE MORNING AND 1 AND 1/2 TABLETS IN THE EVENING AS NEEDED Biagio Borg, MD Taking Active   amLODipine (NORVASC) 5 MG tablet [id number] Yes TAKE 1 TABLET (5 MG TOTAL) BY MOUTH DAILY. Collier Salina, MD Taking Active   atorvastatin (LIPITOR) 20 MG tablet [id number] Yes TAKE 1 TABLET BY MOUTH EVERY DAY  Patient taking differently: Take 20 mg by mouth daily.   Biagio Borg, MD Taking Active   baclofen (LIORESAL) 10 MG tablet [id number] Yes Take 10 mg by mouth daily as needed (Back spasms). [provider] Taking Active Self  Cholecalciferol (VITAMIN D3) 50 MCG (2000 UT) TABS [id number] Yes Take 2,000 Units by mouth daily.  [provider] Taking Active Self  clotrimazole-betamethasone (LOTRISONE) cream [id number] Yes APPLY TO AFFECTED AREA TWICE A DAY Biagio Borg, MD Taking Active   docusate sodium (COLACE) 100 MG capsule [id number] Yes Take 200 mg by mouth 2 (two) times daily. [provider] Taking Active Self  fluticasone (FLONASE) 50 MCG/ACT nasal spray [id number] Yes Place 2 sprays into both nostrils daily. [provider] Taking Active Self  HYDROcodone-acetaminophen ([place]) 10-325 MG per tablet [id number] Yes Take 1 tablet by mouth every 6 (six) hours. [provider] Taking Active Self  hydrocortisone (ANUSOL-HC) 2.5 % rectal cream [id number] Yes Apply 1 application topically 2 (two) times daily. [provider] Taking Active   MOVANTIK 25 MG TABS tablet [id number] Yes Take 1 tablet (25 mg total) by mouth every morning. Zehr, Laban Emperor, PA-C Taking Active   naproxen (NAPROSYN) 500 MG tablet [id number] Yes TAKE 1 TABLET (500 MG TOTAL) BY MOUTH 2 (TWO) TIMES DAILY AS NEEDED FOR HEADACHE. Biagio Borg, MD Taking Active Self  omeprazole (PRILOSEC) 20 MG capsule [id number] Yes TAKE 1 CAPSULE BY MOUTH DAILY AS NEEDED. Janith Lima, MD Taking Active   SUMAtriptan (IMITREX) 100 MG tablet [id number] No Take 1 tablet (100 mg total) by mouth every 2 (two) hours as needed for migraine or headache. May repeat in 2 hours if headache persists or recurs.  Patient not taking: Reported on 02/15/2021   Biagio Borg, MD Not Taking Active Self  tamsulosin [facility])  0.4 MG CAPS capsule [id number] Yes TAKE 1 CAPSULE BY MOUTH EVERY DAY  Patient taking differently: Take 0.4 mg by mouth daily.   Biagio Borg, MD Taking Active              SDOH:  (Social Determinants of Health) assessments and interventions performed:    Care Plan  Review of patient past medical history, allergies, medications, health status, including review of consultants reports, laboratory and other test data, was  performed as part of comprehensive evaluation for care management services.   Care Plan : RN Care Manager Plan of Care  Updates made by Barbaraann Faster, RN since 04/13/2021 12:00 AM     Problem: Complex Care Coordination Needs and disease management in patient with chronic pain, stroke, mobility concerns   Priority: High     Long-Range Goal: Establish Plan of Care for Management Complex SDOH Barriers, disease management and Care Coordination Needs in patient with chronic pain, stroke, mobility concerns   Start Date: 02/10/2021  This Visit's Progress: On track  Recent Progress: On track  Priority: High  Note:   Current Barriers:  Knowledge Deficits related to plan of care for management of chronic pain, stroke, mobility concerns Care Coordination needs related to ADL IADL limitations, Limited education about chronic pain, stroke, mobility concerns*, and Lacks knowledge of community resource: chronic pain, stroke, mobility personal care services concerns Mobility, health behaviors Interest in personal care services -has completed medicaid application pending response, various call attempts to social services unsuccessfully 03/15/21 Understanding of DSS forms/letters/medicaid process  RN CM Clinical Goal(s):  Patient will verbalize understanding of plan for management of chronic pain, stroke, mobility concerns work with [place]  to complete medicaid application  through collaboration with Consulting civil engineer, provider, and care team. - Goal met 03/10/21 confirms completion of medicaid application pending approval response 03/15/21 patient will verbalize understanding of DSS letters and be connected with DSS staff for further resources  Interventions: Follow up with patient as agree to re assess needs Inter-disciplinary care team collaboration (see longitudinal plan of care) Evaluation of current treatment plan related to  self management and patient's adherence to plan as  established by provider 03/10/21 Assist with conference and non conference outreach to [place] for medicaid application status.  RN CM left a message for Abbi Gill at [phone] listed as th medicaid review manager Requesting the status of pt medicaid application and possible assist with personal care services Discussed extend wait time with calling main DSS line and left pt and RN CM numbers  Used teach back method to clarify that he understood the second recent letter he received. This letter stated he has full medicaid and his recent medicaid application was closed. RN CM and patient completed a conference call to leave a message for Mrs [name] (reported by Mrs Rosita Fire to be patient's assigned DSS staff) for more clarity,  questions and assist with personal care services.  Explained to him the importance of him asking Mrs [name] about his specific medicaid and if it covers personal care services if she calls him He voices understanding 03/19/21 another message left for DSS staff, [place], Follow up with patient, updated him 04/09/21 care coordination with pcp referral staff Enid Derry about [facility], PCS , updated patient, encourage him to continue outreaches/interventions with DSS/CAPs staff Educated him again he has to have full medicaid to get PCS, or pay out of pocket for PCS but has option of UHC covered PT/OT limited sessions  Pain Interventions: on track yes Pain assessment performed Medications reviewed Reviewed provider established plan for pain management; Discussed importance of adherence to all scheduled medical appointments; Counseled on the importance of reporting any/all new or changed pain symptoms or management strategies to pain management provider; Advised patient to report to care team affect of pain on daily activities; Discussed use of relaxation techniques and/or diversional activities to assist with pain reduction (distraction, imagery, relaxation, massage, acupressure, TENS,  heat, and cold application; Screening for signs and symptoms of depression related to chronic disease state;   Stroke with mobility concernsGoal on track:  Yes. Evaluation of current treatment plan related to  stroke, mobility concerns , ADL IADL limitations, Limited education about stroke, mobility concerns*, and Lacks knowledge of community resource: medicaid  self-management and patient's adherence to plan as established by provider. Discussed plans with patient for ongoing care management follow up and provided patient with direct contact information for care management team Provided education to patient re: stroke  Collaborated with DSS regarding medicaid application Provided patient and/or caregiver with home health information about [facility] Advice worker) Discussed plans with patient for ongoing care management follow up and provided patient with direct contact information for care management team Assessed social determinant of health barriers Recommendations/Changes made from today's visit:  RN CM updated him of the agency [facility] (previously [place])  Questions answered for him He was encouraged to give the agency an opportunity to work with him and to continue to see his pain management providers  RN CM inquired of an update on his outreaches with DSS   Patient Goals/Self-Care Activities: Patient will self administer medications as prescribed Patient will attend all scheduled provider appointments Patient will call pharmacy for medication refills Patient will attend church or other social activities Patient will continue to perform ADL's independently Patient will continue to perform IADL's independently Patient will call provider office for new concerns or questions  Follow Up Plan:  The patient has been provided with contact information for the care management team and has been advised to call with any health related questions or concerns.  The care  management team will reach out to the patient again over the next 30 business  days.       Plan: The patient has been provided with contact information for the care management team and has been advised to call with any health related questions or concerns.  The care management team will reach out to the patient again over the next 30+ business days.  Anne-Marie Genson L. [name], RN, BSN, [place] Coordinator Office number [phone] Main [facility] number [phone] Fax number [phone]

## 2021-04-19 DIAGNOSIS — Z9181 History of falling: Secondary | ICD-10-CM | POA: Diagnosis not present

## 2021-04-19 DIAGNOSIS — F32A Depression, unspecified: Secondary | ICD-10-CM | POA: Diagnosis not present

## 2021-04-19 DIAGNOSIS — Z602 Problems related to living alone: Secondary | ICD-10-CM | POA: Diagnosis not present

## 2021-04-19 DIAGNOSIS — Z87891 Personal history of nicotine dependence: Secondary | ICD-10-CM | POA: Diagnosis not present

## 2021-04-19 DIAGNOSIS — I7301 Raynaud's syndrome with gangrene: Secondary | ICD-10-CM | POA: Diagnosis not present

## 2021-04-19 DIAGNOSIS — K5903 Drug induced constipation: Secondary | ICD-10-CM | POA: Diagnosis not present

## 2021-04-19 DIAGNOSIS — E785 Hyperlipidemia, unspecified: Secondary | ICD-10-CM | POA: Diagnosis not present

## 2021-04-19 DIAGNOSIS — K5900 Constipation, unspecified: Secondary | ICD-10-CM | POA: Diagnosis not present

## 2021-04-19 DIAGNOSIS — M5136 Other intervertebral disc degeneration, lumbar region: Secondary | ICD-10-CM | POA: Diagnosis not present

## 2021-04-19 DIAGNOSIS — I7300 Raynaud's syndrome without gangrene: Secondary | ICD-10-CM | POA: Diagnosis not present

## 2021-04-19 DIAGNOSIS — I69354 Hemiplegia and hemiparesis following cerebral infarction affecting left non-dominant side: Secondary | ICD-10-CM | POA: Diagnosis not present

## 2021-04-21 DIAGNOSIS — I69354 Hemiplegia and hemiparesis following cerebral infarction affecting left non-dominant side: Secondary | ICD-10-CM | POA: Diagnosis not present

## 2021-04-21 DIAGNOSIS — M5136 Other intervertebral disc degeneration, lumbar region: Secondary | ICD-10-CM | POA: Diagnosis not present

## 2021-04-21 DIAGNOSIS — K5900 Constipation, unspecified: Secondary | ICD-10-CM | POA: Diagnosis not present

## 2021-04-21 DIAGNOSIS — I7301 Raynaud's syndrome with gangrene: Secondary | ICD-10-CM | POA: Diagnosis not present

## 2021-04-26 ENCOUNTER — Other Ambulatory Visit: Payer: Self-pay | Admitting: *Deleted

## 2021-04-26 NOTE — Patient Outreach (Signed)
[facility]) Care Management Telephonic RN Care Manager Note   04/26/2021 Name:  GIAVANNI ODONOVAN MRN:  [id number] DOB:  04-11-1960  Summary: Patient called RN CM to request name of DSS SW [place] and number  He reports having a text that was deleted from the [place] of a cost of $1300 for private duty/personal care services out of pocket cost discussed with him by DSS staff.  He confirms a visit from [facility] OT and a pending visit to pcp to assist with determining the future and total number of visits for [facility] OT. He reports "needing more than that" "I'm getting worst"   Recommendations/Changes made from today's visit: Received a call from patient Provided the number listed for DSS SW [place] RN CM had listed in a previous note as [phone] Reviewed with pt that if his medicaid does not cover personal care services he will have an out of pocket cost.     Subjective: Danny Hopkins is an 61 y.o. year old male who is a primary patient of Harlan Stains, MD. The care management team was consulted for assistance with care management and/or care coordination needs.    Telephonic RN Care Manager completed Telephone Visit today.   Objective:  Medications Reviewed Today     Reviewed by Carole Binning, LPN (Licensed Practical Nurse) on 02/15/21 at 34  Med List Status: <None>   Medication Order Taking? Sig Documenting Provider Last Dose Status Informant  ALPRAZolam (XANAX) 1 MG tablet [id number] Yes TAKE 1 TABLET IN THE MORNING AND 1 AND 1/2 TABLETS IN THE EVENING AS NEEDED Biagio Borg, MD Taking Active   amLODipine (NORVASC) 5 MG tablet [id number] Yes TAKE 1 TABLET (5 MG TOTAL) BY MOUTH DAILY. Collier Salina, MD Taking Active   atorvastatin (LIPITOR) 20 MG tablet [id number] Yes TAKE 1 TABLET BY MOUTH EVERY DAY  Patient taking differently: Take 20 mg by mouth daily.   Biagio Borg, MD Taking Active   baclofen (LIORESAL) 10 MG tablet [id number] Yes Take 10 mg  by mouth daily as needed (Back spasms). [provider] Taking Active Self  Cholecalciferol (VITAMIN D3) 50 MCG (2000 UT) TABS [id number] Yes Take 2,000 Units by mouth daily. [provider] Taking Active Self  clotrimazole-betamethasone (LOTRISONE) cream [id number] Yes APPLY TO AFFECTED AREA TWICE A DAY Biagio Borg, MD Taking Active   docusate sodium (COLACE) 100 MG capsule [id number] Yes Take 200 mg by mouth 2 (two) times daily. [provider] Taking Active Self  fluticasone (FLONASE) 50 MCG/ACT nasal spray [id number] Yes Place 2 sprays into both nostrils daily. [provider] Taking Active Self  HYDROcodone-acetaminophen ([place]) 10-325 MG per tablet [id number] Yes Take 1 tablet by mouth every 6 (six) hours. [provider] Taking Active Self  hydrocortisone (ANUSOL-HC) 2.5 % rectal cream [id number] Yes Apply 1 application topically 2 (two) times daily. [provider] Taking Active   MOVANTIK 25 MG TABS tablet [id number] Yes Take 1 tablet (25 mg total) by mouth every morning. Zehr, Laban Emperor, PA-C Taking Active   naproxen (NAPROSYN) 500 MG tablet [id number] Yes TAKE 1 TABLET (500 MG TOTAL) BY MOUTH 2 (TWO) TIMES DAILY AS NEEDED FOR HEADACHE. Biagio Borg, MD Taking Active Self  omeprazole (PRILOSEC) 20 MG capsule [id number] Yes TAKE 1 CAPSULE BY MOUTH DAILY AS NEEDED. Janith Lima, MD Taking Active   SUMAtriptan (IMITREX) 100 MG tablet [id number] No Take 1 tablet (100 mg total) by mouth  every 2 (two) hours as needed for migraine or headache. May repeat in 2 hours if headache persists or recurs.  Patient not taking: Reported on 02/15/2021   Biagio Borg, MD Not Taking Active Self  tamsulosin [facility]) 0.4 MG CAPS capsule [id number] Yes TAKE 1 CAPSULE BY MOUTH EVERY DAY  Patient taking differently: Take 0.4 mg by mouth daily.   Biagio Borg, MD Taking Active             Patient Active Problem List   Diagnosis Date Noted   Raynaud's disease  with gangrene ([place]) 02/15/2021   Anxiety 02/15/2021   Benign prostatic hyperplasia without lower urinary tract symptoms 02/15/2021   Constipation 02/15/2021   Dyslipidemia 02/15/2021   Hemiplegia of nondominant side as late effect of cerebrovascular disease ([place]) 02/15/2021   Therapeutic opioid induced constipation 01/21/2021   Rectal bleeding 01/21/2021   Internal hemorrhoids 01/21/2021   Vasculitis ([place]) 11/02/2020   Lumbar radiculopathy 09/17/2020   Allergic rhinitis 08/23/2020   Acute dysfunction of right eustachian tube 08/23/2020   Dysphagia 06/10/2020   Sore in nose 05/21/2020   Weight loss 05/19/2020   Left arm pain 04/19/2020   Migraines 01/07/2020   Depression 08/23/2019   Spasticity 05/29/2019   Vitamin D deficiency 08/25/2018   Laceration of left hand 09/27/2017   Acute pharyngitis 07/14/2017   Chronic tonsillitis 07/12/2017   Chronic pain syndrome 06/22/2017   Bradycardia 06/22/2017   Abnormal ECG 06/22/2017   Former smoker 06/21/2016   Scalp pain 07/30/2015   Elevated blood-pressure reading, without diagnosis of hypertension 06/24/2015   Dandruff 06/24/2015   Lymphadenitis 06/24/2015   Burning with urination 01/25/2015   Balanitis 01/25/2015   Sleeping difficulties 02/04/2014   Bladder neck obstruction 02/04/2014   Hyperlipidemia 02/04/2014   Anterior spinal artery compression syndrome 10/10/2013   Other malaise and fatigue 07/24/2013   Stroke ([place]) 07/24/2013   Encounter for well adult exam with abnormal findings 07/24/2013   Neurologic gait disorder 07/24/2013   Other intervertebral disc displacement, lumbar region 05/29/2013   Cervical spondylosis with myelopathy 01/30/2013   Insomnia 10/12/2012   Generalized anxiety disorder 10/12/2012   Eczema 10/12/2012   History of stroke 10/12/2012   Corn or callus 07/07/2010   ED (erectile dysfunction) of organic origin 07/07/2010   Neurogenic bladder 06/10/2010   Unspecified urinary incontinence 06/10/2010    Hemiplegia ([place]) 06/09/2010    SDOH:  (Social Determinants of Health) assessments and interventions performed:    Care Plan  Review of patient past medical history, allergies, medications, health status, including review of consultants reports, laboratory and other test data, was performed as part of comprehensive evaluation for care management services.   Care Plan : RN Care Manager Plan of Care  Updates made by Barbaraann Faster, RN since 04/26/2021 12:00 AM     Problem: Complex Care Coordination Needs and disease management in patient with chronic pain, stroke, mobility concerns   Priority: High     Long-Range Goal: Establish Plan of Care for Management Complex SDOH Barriers, disease management and Care Coordination Needs in patient with chronic pain, stroke, mobility concerns   Start Date: 02/10/2021  This Visit's Progress: On track  Recent Progress: On track  Priority: High  Note:   Current Barriers:  Knowledge Deficits related to plan of care for management of chronic pain, stroke, mobility concerns Care Coordination needs related to ADL IADL limitations, Limited education about chronic pain, stroke, mobility concerns*, and Lacks knowledge of community resource: chronic pain, stroke,  mobility personal care services concerns Mobility, health behaviors Interest in personal care services -has completed medicaid application pending response, various call attempts to social services unsuccessfully 03/15/21 Understanding of DSS forms/letters/medicaid process  RN CM Clinical Goal(s):  Patient will verbalize understanding of plan for management of chronic pain, stroke, mobility concerns work with [place]  to complete medicaid application  through collaboration with Consulting civil engineer, provider, and care team. - Goal met 03/10/21 confirms completion of medicaid application pending approval response 03/15/21 patient will verbalize understanding of DSS letters and be connected  with DSS staff for further resources  Interventions: Follow up with patient as agree to re assess needs Inter-disciplinary care team collaboration (see longitudinal plan of care) Evaluation of current treatment plan related to  self management and patient's adherence to plan as established by provider 03/10/21 Assist with conference and non conference outreach to [place] for medicaid application status.  RN CM left a message for Abbi Gill at [phone] listed as th medicaid review manager Requesting the status of pt medicaid application and possible assist with personal care services Discussed extend wait time with calling main DSS line and left pt and RN CM numbers  Used teach back method to clarify that he understood the second recent letter he received. This letter stated he has full medicaid and his recent medicaid application was closed. RN CM and patient completed a conference call to leave a message for Mrs [name] (reported by Mrs Rosita Fire to be patient's assigned DSS staff) for more clarity,  questions and assist with personal care services.  Explained to him the importance of him asking Mrs [name] about his specific medicaid and if it covers personal care services if she calls him He voices understanding 03/19/21 another message left for DSS staff, [place], Follow up with patient, updated him 04/09/21 care coordination with pcp referral staff [place] about [facility], PCS , updated patient, encourage him to continue outreaches/interventions with DSS/CAPs staff Educated him again he has to have full medicaid to get PCS, or pay out of pocket for PCS but has option of UHC covered PT/OT limited sessions 04/26/21 provided DSS SW number ([place]) [phone] Reviewed PCS cost without medicaid/out of pocket expense  Pain Interventions: on track  no 04/26/21 reporting not doing well Pain assessment performed Medications reviewed Reviewed provider established plan for pain management; Discussed  importance of adherence to all scheduled medical appointments; Counseled on the importance of reporting any/all new or changed pain symptoms or management strategies to pain management provider; Advised patient to report to care team affect of pain on daily activities; Discussed use of relaxation techniques and/or diversional activities to assist with pain reduction (distraction, imagery, relaxation, massage, acupressure, TENS, heat, and cold application; Screening for signs and symptoms of depression related to chronic disease state;   Stroke with mobility concernsGoal on track:  NO. 04/26/21 reports [place] OT visited Evaluation of current treatment plan related to  stroke, mobility concerns , ADL IADL limitations, Limited education about stroke, mobility concerns*, and Lacks knowledge of community resource: medicaid  self-management and patient's adherence to plan as established by provider. Discussed plans with patient for ongoing care management follow up and provided patient with direct contact information for care management team Provided education to patient re: stroke  Collaborated with DSS regarding medicaid application Provided patient and/or caregiver with home health information about [facility] (Data processing manager) Discussed plans with patient for ongoing care management follow up and provided patient with  direct contact information for care management team Assessed social determinant of health barriers Recommendations/Changes made from today's visit:  RN CM updated him of the agency [facility] [place])  Questions answered for him He was encouraged to give the agency an opportunity to work with him and to continue to see his pain management providers  RN CM inquired of an update on his outreaches with DSS   Patient Goals/Self-Care Activities: Patient will self administer medications as prescribed Patient will attend all scheduled provider appointments Patient  will call pharmacy for medication refills Patient will attend church or other social activities Patient will continue to perform ADL's independently Patient will continue to perform IADL's independently Patient will call provider office for new concerns or questions  Follow Up Plan:  The patient has been provided with contact information for the care management team and has been advised to call with any health related questions or concerns.  The care management team will reach out to the patient again over the next 30+ business  days.       Plan: The patient has been provided with contact information for the care management team and has been advised to call with any health related questions or concerns.  The care management team will reach out to the patient again over the next 30+ business days.  Brett Darko L. [name], RN, BSN, [place] Coordinator Office number [phone] Main [facility] number [phone] Fax number [phone]

## 2021-04-28 DIAGNOSIS — M4712 Other spondylosis with myelopathy, cervical region: Secondary | ICD-10-CM | POA: Diagnosis not present

## 2021-04-28 DIAGNOSIS — R252 Cramp and spasm: Secondary | ICD-10-CM | POA: Diagnosis not present

## 2021-04-28 DIAGNOSIS — M5416 Radiculopathy, lumbar region: Secondary | ICD-10-CM | POA: Diagnosis not present

## 2021-04-29 DIAGNOSIS — I69354 Hemiplegia and hemiparesis following cerebral infarction affecting left non-dominant side: Secondary | ICD-10-CM | POA: Diagnosis not present

## 2021-04-29 DIAGNOSIS — F32A Depression, unspecified: Secondary | ICD-10-CM | POA: Diagnosis not present

## 2021-04-29 DIAGNOSIS — I7300 Raynaud's syndrome without gangrene: Secondary | ICD-10-CM | POA: Diagnosis not present

## 2021-04-29 DIAGNOSIS — M5136 Other intervertebral disc degeneration, lumbar region: Secondary | ICD-10-CM | POA: Diagnosis not present

## 2021-04-30 DIAGNOSIS — I7300 Raynaud's syndrome without gangrene: Secondary | ICD-10-CM | POA: Diagnosis not present

## 2021-04-30 DIAGNOSIS — I69354 Hemiplegia and hemiparesis following cerebral infarction affecting left non-dominant side: Secondary | ICD-10-CM | POA: Diagnosis not present

## 2021-04-30 DIAGNOSIS — M5136 Other intervertebral disc degeneration, lumbar region: Secondary | ICD-10-CM | POA: Diagnosis not present

## 2021-04-30 DIAGNOSIS — F32A Depression, unspecified: Secondary | ICD-10-CM | POA: Diagnosis not present

## 2021-05-03 DIAGNOSIS — I7300 Raynaud's syndrome without gangrene: Secondary | ICD-10-CM | POA: Diagnosis not present

## 2021-05-03 DIAGNOSIS — F32A Depression, unspecified: Secondary | ICD-10-CM | POA: Diagnosis not present

## 2021-05-03 DIAGNOSIS — M5136 Other intervertebral disc degeneration, lumbar region: Secondary | ICD-10-CM | POA: Diagnosis not present

## 2021-05-03 DIAGNOSIS — I69354 Hemiplegia and hemiparesis following cerebral infarction affecting left non-dominant side: Secondary | ICD-10-CM | POA: Diagnosis not present

## 2021-05-05 DIAGNOSIS — F32A Depression, unspecified: Secondary | ICD-10-CM | POA: Diagnosis not present

## 2021-05-05 DIAGNOSIS — I7300 Raynaud's syndrome without gangrene: Secondary | ICD-10-CM | POA: Diagnosis not present

## 2021-05-05 DIAGNOSIS — M5136 Other intervertebral disc degeneration, lumbar region: Secondary | ICD-10-CM | POA: Diagnosis not present

## 2021-05-05 DIAGNOSIS — I69354 Hemiplegia and hemiparesis following cerebral infarction affecting left non-dominant side: Secondary | ICD-10-CM | POA: Diagnosis not present

## 2021-05-10 DIAGNOSIS — I69354 Hemiplegia and hemiparesis following cerebral infarction affecting left non-dominant side: Secondary | ICD-10-CM | POA: Diagnosis not present

## 2021-05-10 DIAGNOSIS — I7300 Raynaud's syndrome without gangrene: Secondary | ICD-10-CM | POA: Diagnosis not present

## 2021-05-10 DIAGNOSIS — M5136 Other intervertebral disc degeneration, lumbar region: Secondary | ICD-10-CM | POA: Diagnosis not present

## 2021-05-10 DIAGNOSIS — F32A Depression, unspecified: Secondary | ICD-10-CM | POA: Diagnosis not present

## 2021-05-11 ENCOUNTER — Other Ambulatory Visit: Payer: Self-pay | Admitting: *Deleted

## 2021-05-11 NOTE — Patient Outreach (Signed)
[facility]) Care Management  05/11/2021  Danny Hopkins 1960-05-04 [id number]   [place] coordination- collaboration with patient, pcp, home services  RN CM outreached to pcp office [phone] after a message received from patient requesting the number for DSS staff [place] and stating he really needed to speak with her. This number has been provided 2 times previously and pt has made RN CM aware that he received a letter from DSS x 2. RN CM left a message for pcp RN requesting a [place] SW order for assist if possible with level of care assistance, personal care services, etc.  RN CM left a message for [place] at [phone]. Several previous message left but without a return call RN  CM outreach to [place] without an answer after a long wait time Various calls from various [place] practices received  Returned a call to Danny Hopkins and gave him DSS staff number [phone]. He confirms again he has spoken with her at least twice. 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 Discussed with him that he should be eligible for VA benefits and healthcare. Encouraged him to visit the [facility] or [place] to establish care. Offered him numbers for either but he reports he is not able to take a number today and would "get another time"  Outreach from [place], pcp received RN CM message. Patient is active with [facility] to get connected with CAP program  Care Plan : [place] of Care  Updates made by Barbaraann Faster, RN since 05/11/2021 12:00 AM     Problem: Complex Care Coordination Needs and disease management in patient with chronic pain, stroke, mobility concerns   Priority: High     Long-Range Goal: Establish Plan of Care for Management Complex SDOH Barriers, disease management and Care  Coordination Needs in patient with chronic pain, stroke, mobility concerns   Start Date: 02/10/2021  This Visit's Progress: On track  Recent Progress: On track  Priority: High  Note:   Current Barriers:  Knowledge Deficits related to plan of care for management of chronic pain, stroke, mobility concerns Care Coordination needs related to ADL IADL limitations, Limited education about chronic pain, stroke, mobility concerns*, and Lacks knowledge of community resource: chronic pain, stroke, mobility personal care services concerns Mobility, health behaviors Interest in personal care services -has completed medicaid application pending response, various call attempts to social services unsuccessfully 03/15/21 Understanding of DSS forms/letters/medicaid process  RN CM Clinical Goal(s):  Patient will verbalize understanding of plan for management of chronic pain, stroke, mobility concerns work with [place]  to complete medicaid application  through collaboration with Consulting civil engineer, provider, and care team. - Goal met 03/10/21 confirms completion of medicaid application pending approval response 03/15/21 patient will verbalize understanding of DSS letters and be connected with DSS staff for further resources  Interventions: Follow up with patient as agree to re assess needs Inter-disciplinary care team collaboration (see longitudinal plan of care) Evaluation of current treatment plan related to  self management and patient's adherence to plan as established by provider 03/10/21 Assist with conference and non conference outreach to [place] for medicaid application status.  RN CM left a message for Abbi Gill at [phone] listed as th medicaid review Freight forwarder  Requesting the status of pt medicaid application and possible assist with personal care services Discussed extend wait time with calling main DSS line and left pt and RN CM numbers  Used teach back method to clarify that he  understood the second recent letter he received. This letter stated he has full medicaid and his recent medicaid application was closed. RN CM and patient completed a conference call to leave a message for Danny Hopkins (reported by Danny Hopkins to be patient's assigned DSS staff) for more clarity,  questions and assist with personal care services.  Explained to him the importance of him asking Danny Hopkins about his specific medicaid and if it covers personal care services if she calls him He voices understanding 03/19/21 another message left for DSS staff, [place], Follow up with patient, updated him 04/09/21 care coordination with pcp referral staff [place] about [facility], PCS , updated patient, encourage him to continue outreaches/interventions with DSS/CAPs staff Educated him again he has to have full medicaid to get PCS, or pay out of pocket for PCS but has option of UHC covered PT/OT limited sessions 04/26/21 provided DSS SW number ([place]) [phone] Reviewed PCS cost without medicaid/out of pocket expense 05/11/21 return call to pt, calls to DSS, pcp, pt- Provided DSS [place] number to pt,questions answered about VA benefits, collaborated with pcp staff- [facility], pcp assisting to connect pt to CAP program  Pain Interventions: on track  long term goal  Pain assessment performed Medications reviewed Reviewed provider established plan for pain management Discussed importance of adherence to all scheduled medical appointments Counseled on the importance of reporting any/all new or changed pain symptoms or management strategies to pain management provider Advised patient to report to care team affect of pain on daily activities Discussed use of relaxation techniques and/or diversional activities to assist with pain reduction (distraction, imagery, relaxation, massage, acupressure, TENS, heat, and cold application Screening for signs and symptoms of depression related to chronic disease state  Assessed social  determinant of health barriers  Stroke with mobility concernsGoal on track:  Yes. 05/11/21 active with [facility]  Evaluation of current treatment plan related to  stroke, mobility concerns , ADL IADL limitations, Limited education about stroke, mobility concerns*, and Lacks knowledge of community resource: medicaid  self-management and patient's adherence to plan as established by provider. Discussed plans with patient for ongoing care management follow up and provided patient with direct contact information for care management team Provided education to patient re: stroke  Collaborated with DSS regarding medicaid application Provided patient and/or caregiver with home health information about [facility] Passenger transport manager) (Data processing manager) Discussed plans with patient for ongoing care management follow up and provided patient with direct contact information for care management team Assessed social determinant of health barriers Recommendations/Changes made from today's visit:  RN CM updated him of the agency [facility] [place])  Questions answered for him He was encouraged to give the agency an opportunity to work with him and to continue to see his pain management providers  RN CM inquired of an update on his outreaches with DSS   Patient Goals/Self-Care Activities: Patient will self administer medications as prescribed Patient will attend all scheduled provider appointments Patient will call pharmacy for medication refills Patient will attend church or other social activities Patient will continue to perform ADL's independently Patient will continue to perform IADL's independently Patient will call provider office for new concerns or questions  Follow Up Plan:  The patient has been provided  with contact information for the care management team and has been advised to call with any health related questions or concerns.  The care management team will reach out to the patient again  over the next 30+ business  days.       Plan [facility] RN CM will follow up with patient within the next 30+ business days RN CM will collaborate with pcp office staff prn   Joelene Millin L. [name], RN, BSN, [place] Coordinator Office number [phone]

## 2021-05-13 ENCOUNTER — Other Ambulatory Visit: Payer: Self-pay | Admitting: Internal Medicine

## 2021-05-13 DIAGNOSIS — I7301 Raynaud's syndrome with gangrene: Secondary | ICD-10-CM

## 2021-05-13 DIAGNOSIS — I776 Arteritis, unspecified: Secondary | ICD-10-CM

## 2021-05-14 DIAGNOSIS — M5136 Other intervertebral disc degeneration, lumbar region: Secondary | ICD-10-CM | POA: Diagnosis not present

## 2021-05-14 DIAGNOSIS — I69354 Hemiplegia and hemiparesis following cerebral infarction affecting left non-dominant side: Secondary | ICD-10-CM | POA: Diagnosis not present

## 2021-05-14 DIAGNOSIS — F32A Depression, unspecified: Secondary | ICD-10-CM | POA: Diagnosis not present

## 2021-05-14 DIAGNOSIS — I7300 Raynaud's syndrome without gangrene: Secondary | ICD-10-CM | POA: Diagnosis not present

## 2021-05-17 ENCOUNTER — Ambulatory Visit: Payer: Medicare Other | Admitting: Internal Medicine

## 2021-05-18 DIAGNOSIS — I7300 Raynaud's syndrome without gangrene: Secondary | ICD-10-CM | POA: Diagnosis not present

## 2021-05-18 DIAGNOSIS — M5136 Other intervertebral disc degeneration, lumbar region: Secondary | ICD-10-CM | POA: Diagnosis not present

## 2021-05-18 DIAGNOSIS — I69354 Hemiplegia and hemiparesis following cerebral infarction affecting left non-dominant side: Secondary | ICD-10-CM | POA: Diagnosis not present

## 2021-05-18 DIAGNOSIS — F32A Depression, unspecified: Secondary | ICD-10-CM | POA: Diagnosis not present

## 2021-05-24 DIAGNOSIS — I69354 Hemiplegia and hemiparesis following cerebral infarction affecting left non-dominant side: Secondary | ICD-10-CM | POA: Diagnosis not present

## 2021-05-24 DIAGNOSIS — M5136 Other intervertebral disc degeneration, lumbar region: Secondary | ICD-10-CM | POA: Diagnosis not present

## 2021-05-24 DIAGNOSIS — I7300 Raynaud's syndrome without gangrene: Secondary | ICD-10-CM | POA: Diagnosis not present

## 2021-05-24 DIAGNOSIS — F32A Depression, unspecified: Secondary | ICD-10-CM | POA: Diagnosis not present

## 2021-05-25 ENCOUNTER — Other Ambulatory Visit: Payer: Self-pay | Admitting: *Deleted

## 2021-05-25 NOTE — Patient Outreach (Signed)
[facility]) Care Management  05/25/2021  Danny Hopkins 1960/11/20 [id number]   [facility] Unsuccessful outreach   Outreach attempt to the listed at the preferred outreach number in EPIC  No answer. [facility] RN CM left HIPAA [facility] Portability and Accountability Act) compliant voicemail message along with CMs contact info.   Plan: [facility] RN CM scheduled this patient for another call attempt within 4-7 business days Unsuccessful outreach on 05/25/21   Alixandra Alfieri L. [name], RN, BSN, [place] Coordinator Office number [phone]

## 2021-05-26 ENCOUNTER — Ambulatory Visit: Payer: Medicare Other | Admitting: Internal Medicine

## 2021-05-26 ENCOUNTER — Other Ambulatory Visit: Payer: Self-pay | Admitting: *Deleted

## 2021-05-26 NOTE — Patient Outreach (Signed)
[facility]) Care Management Telephonic RN Care Manager Note   05/26/2021 Name:  TREVAN MESSMAN MRN:  [id number] DOB:  April 12, 1960  Summary: Successful outreach return call to patient after he left 2 voice messages after RN CM attempted to reach him  Mr Starnes reports he is doing fair and reports "soon I will really need some help around here"  He confirms a fall/no injury/trouble walking/possible infected finger/3 right hand fingers discolored & sore as shared during his 05/17/21 MD visit He clarifies he does not want to relocate to a facility, does not want to leave his apartment and his dog but want to get ongoing home personal care services as he feels his medical conditions are worsening  With inquiry he reports he continues to receive [facility] that was twice a week now once a week He reports he does not feel he is getting better He has a number for CAPs and spoke with Tommi Rumps who reports he is pending a letter from CAPs status  Follow up appointments  06/15/21 rheumatology Dr Benjamine Mola 06/28/21 to follow up with pcp  Noticed with review of EPIC patient has not been to last 2 rheumatology visits. 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,therefore, he does not have any VA benefits     Recommendations/Changes made from today's visit: Return call to patient  Wished patient a happy birthday Assessed for worsening symptoms and attempt to get clarity of why he rates his health as fair Reviewed with him that RN CM had reviewed EPIC 05/17/21 pcp visit notes indicating he fell/no injury/trouble walking/possible infected finger/3 right hand fingers discolored, HH services, PCS referral Reviewed types of  home health services to include [facility] RN, PT, OT, aide, SW- inquired about SW services Inquired about his follow up on [place] benefits for possible  personal care services assistance  Discussed RN CM's previous outreach to [employer] staff to voice his concerns of worsening medical symptoms Encouraged him to clarify his preference to his MDs   Subjective: ABAS LEICHT is an 61 y.o. year old male who is a primary patient of Harlan Stains, MD. The care management team was consulted for assistance with care management and/or care coordination needs.    Telephonic RN Care Manager completed Telephone Visit today.   Objective:  Medications Reviewed Today     Reviewed by Carole Binning, LPN (Licensed Practical Nurse) on 02/15/21 at 85  Med List Status: <None>   Medication Order Taking? Sig Documenting Provider Last Dose Status Informant  ALPRAZolam (XANAX) 1 MG tablet [id number] Yes TAKE 1 TABLET IN THE MORNING AND 1 AND 1/2 TABLETS IN THE EVENING AS NEEDED Biagio Borg, MD Taking Active   amLODipine (NORVASC) 5 MG tablet [id number] Yes TAKE 1 TABLET (5 MG TOTAL) BY MOUTH DAILY. Collier Salina, MD Taking Active   atorvastatin (LIPITOR) 20 MG tablet [id number] Yes TAKE 1 TABLET BY MOUTH EVERY DAY  Patient taking differently: Take 20 mg by mouth daily.   Biagio Borg, MD Taking Active   baclofen (LIORESAL) 10 MG tablet [id number] Yes Take 10 mg by mouth daily as needed (Back spasms). [provider] Taking Active Self  Cholecalciferol (VITAMIN D3) 50 MCG (2000 UT) TABS [id number] Yes Take 2,000 Units by mouth daily. [provider] Taking Active Self  clotrimazole-betamethasone (LOTRISONE) cream [id number] Yes  APPLY TO AFFECTED AREA TWICE A DAY Biagio Borg, MD Taking Active   docusate sodium (COLACE) 100 MG capsule [id number] Yes Take 200 mg by mouth 2 (two) times daily. [provider] Taking Active Self  fluticasone (FLONASE) 50 MCG/ACT nasal spray [id number] Yes Place 2 sprays into both nostrils daily. [provider] Taking Active Self  HYDROcodone-acetaminophen ([place]) 10-325 MG per tablet [id number]  Yes Take 1 tablet by mouth every 6 (six) hours. [provider] Taking Active Self  hydrocortisone (ANUSOL-HC) 2.5 % rectal cream [id number] Yes Apply 1 application topically 2 (two) times daily. [provider] Taking Active   MOVANTIK 25 MG TABS tablet [id number] Yes Take 1 tablet (25 mg total) by mouth every morning. Zehr, Laban Emperor, PA-C Taking Active   naproxen (NAPROSYN) 500 MG tablet [id number] Yes TAKE 1 TABLET (500 MG TOTAL) BY MOUTH 2 (TWO) TIMES DAILY AS NEEDED FOR HEADACHE. Biagio Borg, MD Taking Active Self  omeprazole (PRILOSEC) 20 MG capsule [id number] Yes TAKE 1 CAPSULE BY MOUTH DAILY AS NEEDED. Janith Lima, MD Taking Active   SUMAtriptan (IMITREX) 100 MG tablet [id number] No Take 1 tablet (100 mg total) by mouth every 2 (two) hours as needed for migraine or headache. May repeat in 2 hours if headache persists or recurs.  Patient not taking: Reported on 02/15/2021   Biagio Borg, MD Not Taking Active Self  tamsulosin [facility]) 0.4 MG CAPS capsule [id number] Yes TAKE 1 CAPSULE BY MOUTH EVERY DAY  Patient taking differently: Take 0.4 mg by mouth daily.   Biagio Borg, MD Taking Active              SDOH:  (Social Determinants of Health) assessments and interventions performed:  SDOH Interventions    Flowsheet Row Most Recent Value  SDOH Interventions   Food Insecurity Interventions Intervention Not Indicated  Housing Interventions Intervention Not Indicated  Intimate Partner Violence Interventions Intervention Not Indicated  Transportation Interventions Intervention Not Indicated       Care Plan  Review of patient past medical history, allergies, medications, health status, including review of consultants reports, laboratory and other test data, was performed as part of comprehensive evaluation for care management services.   Care Plan : RN Care Manager Plan of Care  Updates made by Barbaraann Faster, RN since 05/26/2021 12:00 AM     Problem:  Complex Care Coordination Needs and disease management in patient with chronic pain, stroke, mobility concerns   Priority: High     Long-Range Goal: Establish Plan of Care for Management Complex SDOH Barriers, disease management and Care Coordination Needs in patient with chronic pain, stroke, mobility concerns   Start Date: 02/10/2021  This Visit's Progress: On track  Recent Progress: On track  Priority: High  Note:   Current Barriers:  Knowledge Deficits related to plan of care for management of chronic pain, stroke, mobility concerns Care Coordination needs related to ADL IADL limitations, Limited education about chronic pain, stroke, mobility concerns*, and Lacks knowledge of community resource: chronic pain, stroke, mobility personal care services concerns Mobility, health behaviors Interest in personal care services -has completed medicaid application pending response, various call attempts to social services unsuccessfully 03/15/21 Understanding of DSS forms/letters/medicaid process  RN CM Clinical Goal(s):  Patient will verbalize understanding of plan for management of chronic pain, stroke, mobility concerns work with [place]  to complete medicaid application  through collaboration with Consulting civil engineer, provider, and care team. - Goal met 03/10/21 confirms  completion of medicaid application pending approval response 03/15/21 patient will verbalize understanding of DSS letters and be connected with DSS staff for further resources  Interventions: Follow up with patient as agree to re assess needs Inter-disciplinary care team collaboration (see longitudinal plan of care) Evaluation of current treatment plan related to  self management and patient's adherence to plan as established by provider 03/10/21 Assist with conference and non conference outreach to [place] for medicaid application status.  RN CM left a message for Abbi Gill at [phone] listed as th medicaid  review manager Requesting the status of pt medicaid application and possible assist with personal care services Discussed extend wait time with calling main DSS line and left pt and RN CM numbers  Used teach back method to clarify that he understood the second recent letter he received. This letter stated he has full medicaid and his recent medicaid application was closed. RN CM and patient completed a conference call to leave a message for Mrs [name] (reported by Mrs Rosita Fire to be patient's assigned DSS staff) for more clarity,  questions and assist with personal care services.  Explained to him the importance of him asking Mrs [name] about his specific medicaid and if it covers personal care services if she calls him He voices understanding 03/19/21 another message left for DSS staff, [place], Follow up with patient, updated him 04/09/21 care coordination with pcp referral staff En[place] D[place] about [facility], PCS , updated patient, encourage him to continue outreaches/interventions with DSS/CAPs staff Educated him again he has to have full medicaid to get PCS, or pay out of pocket for PCS but has option of UHC covered PT/OT limited sessions 04/26/21 provided DSS SW number ([place]) [phone] Reviewed PCS cost without medicaid/out of pocket expense 05/11/21 return call to pt, calls to DSS, pcp, pt- Provided DSS [place] number to pt,questions answered about VA benefits, collaborated with pcp staff- [facility], pcp assisting to connect pt to CAP program 05/25/21 unsuccessful outreach 05/26/21 Assessed for worsening symptoms and attempt to get clarity of why he rates his health as fair Reviewed with him that RN CM had reviewed EPIC 05/17/21 pcp visit notes indicating he fell/no injury/trouble walking/possible infected finger/3 right hand fingers discolored, HH services, PCS referral Reviewed types of  home health services to include [facility] RN, PT, OT, aide, SW- inquired about SW services Inquired about his follow up on [place] benefits  for possible personal care services assistance  Discussed RN CM's previous outreach to [employer] staff to voice his concerns of worsening medical symptoms Encouraged him to clarify his preference to his MDs  Pain Interventions: on track  long term goal  Pain assessment performed Medications reviewed Reviewed provider established plan for pain management Discussed importance of adherence to all scheduled medical appointments Counseled on the importance of reporting any/all new or changed pain symptoms or management strategies to pain management provider Advised patient to report to care team affect of pain on daily activities Discussed use of relaxation techniques and/or diversional activities to assist with pain reduction (distraction, imagery, relaxation, massage, acupressure, TENS, heat, and cold application Screening for signs and symptoms of depression related to chronic disease state  Assessed social determinant of health barriers  Stroke with mobility concernsGoal on track:  Yes. 05/26/21 active with [facility]  Evaluation of current treatment plan related to  stroke, mobility concerns , ADL IADL limitations, Limited education about stroke, mobility concerns*, and Lacks knowledge of community resource: medicaid  self-management and patient's  adherence to plan as established by provider. Discussed plans with patient for ongoing care management follow up and provided patient with direct contact information for care management team Provided education to patient re: stroke  Collaborated with DSS regarding medicaid application Provided patient and/or caregiver with home health information about [facility] Passenger transport manager) (Data processing manager) Discussed plans with patient for ongoing care management follow up and provided patient with direct contact information for care management team Assessed social determinant of health barriers Recommendations/Changes made from today's visit:  RN CM updated him of the  agency [facility] [place]  Questions answered for him He was encouraged to give the agency an opportunity to work with him and to continue to see his pain management providers  RN CM inquired of an update on his outreaches with DSS   Patient Goals/Self-Care Activities: Patient will self administer medications as prescribed Patient will attend all scheduled provider appointments Patient will call pharmacy for medication refills Patient will attend church or other social activities Patient will continue to perform ADL's independently Patient will continue to perform IADL's independently Patient will call provider office for new concerns or questions  Follow Up Plan:  The patient has been provided with contact information for the care management team and has been advised to call with any health related questions or concerns.  The care management team will reach out to the patient again over the next 30+ business  days.       Plan: The patient has been provided with contact information for the care management team and has been advised to call with any health related questions or concerns.  The care management team will reach out to the patient again over the next 30+ business days.  Tylin Stradley L. [name], RN, BSN, [place] Coordinator Office number [phone] [facility] number [phone] Fax number [phone]

## 2021-05-26 NOTE — Progress Notes (Deleted)
? ?Office Visit Note ? ?Patient: Danny Hopkins             ?Date of Birth: December 30, 1960           ?MRN: [id number]             ?PCP: Harlan Stains, MD ?Referring: Harlan Stains, MD ?Visit Date: 05/26/2021 ? ? ?Subjective:  ?No chief complaint on file. ? ? ?History of Present Illness: Danny Hopkins is a 61 y.o. male here for follow up for raynaud's disease with ischemic complications on amlodipine increased to 10 mg daily and topical hydrocortisone as needed. ***  ? ?Previous HPI ?02/15/21 ?Danny Hopkins is a 61 y.o. male here for follow up for peripheral vascular insufficiency with ulceration of the right 4th finger and was started on amlodipine 5 mg daily after negative workup of systemic vasculitis antibodies. Since our last visit fingers are doing overall better he is noticing increase in blue discoloration of 5th fingers on both hands with the colder weather. His right 3rd finger has developed a few spots with redness in the finger and nail. His follow up in primary care clinic has shown some borderline low BP on the amlodipine. ?  ?Previous HPI: ?11/02/20 ?Danny Hopkins is a 61 y.o. male here for evaluation for vasculitis with digital ulcer of right 4th finger. He has felt in overall usual health but had bene noticing cold and numbness changes in his fingertips for months affecting the right 3-5th fingers. He developed a wound on the tip of his 4th finger in June he does not remember a particular injury or maybe a small abrasion starting this. He did not think it was severe but subsequently developed black discoloration of his whole fingertip peeling of the skin. He saw Dr. Burney Gauze for evaluation and subsequent upper extremity arteriography with findings of no patent palmar arch and diminished digital artery perfusions with normal proximal vasculature up to the wrist. Otherwise he denies feeling other significant symptom or medical changes lately. No skin rashes, lesions, no discoloration or  ulceration of other digits or in the past. ?He has chronic lumbar spine degenerative arthritis with radiculopathy. He has chronic left sided residual weakness due to previous stroke. Workup for the stroke reportedly without any specific secondary underlying cause identified. ?  ?Labs reviewed ?09/2020 ?CBC wnl ?BMP eGFR 48 ?UA contaminated, +Ca Oxalate, +glucose ? ? ?No Rheumatology ROS completed.  ? ?PMFS History:  ?Patient Active Problem List  ? Diagnosis Date Noted  ? Raynaud's disease with gangrene ([place]) 02/15/2021  ? Anxiety 02/15/2021  ? Benign prostatic hyperplasia without lower urinary tract symptoms 02/15/2021  ? Constipation 02/15/2021  ? Dyslipidemia 02/15/2021  ? 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 Therapeutic opioid induced constipation 01/21/2021  ? Rectal bleeding 01/21/2021  ? Internal hemorrhoids 01/21/2021  ? Vasculitis ([place]) 11/02/2020  ? Lumbar radiculopathy 09/17/2020  ? Allergic rhinitis 08/23/2020  ? Acute dysfunction of right eustachian tube 08/23/2020  ? Dysphagia 06/10/2020  ? Sore in nose 05/21/2020  ? Weight loss 05/19/2020  ? Left arm pain 04/19/2020  ? Migraines 01/07/2020  ? Depression 08/23/2019  ? Spasticity 05/29/2019  ? Vitamin D deficiency 08/25/2018  ? Laceration of left hand 09/27/2017  ? Acute pharyngitis 07/14/2017  ? Chronic tonsillitis 07/12/2017  ? Chronic pain syndrome 06/22/2017  ? Bradycardia 06/22/2017  ? Abnormal ECG 06/22/2017  ? Former smoker 06/21/2016  ? Scalp pain 07/30/2015  ? Elevated blood-pressure  reading, without diagnosis of hypertension 06/24/2015  ? Dandruff 06/24/2015  ? Lymphadenitis 06/24/2015  ? Burning with urination 01/25/2015  ? Balanitis 01/25/2015  ? Sleeping difficulties 02/04/2014  ? Bladder neck obstruction 02/04/2014  ? Hyperlipidemia 02/04/2014  ? Anterior spinal artery compression syndrome 10/10/2013  ? Other malaise and fatigue 07/24/2013  ? Stroke ([place]) 07/24/2013  ? Encounter for  well adult exam with abnormal findings 07/24/2013  ? Neurologic gait disorder 07/24/2013  ? Other intervertebral disc displacement, lumbar region 05/29/2013  ? Cervical spondylosis with myelopathy 01/30/2013  ? Insomnia 10/12/2012  ? Generalized anxiety disorder 10/12/2012  ? Eczema 10/12/2012  ? History of stroke 10/12/2012  ? Corn or callus 07/07/2010  ? ED (erectile dysfunction) of organic origin 07/07/2010  ? Neurogenic bladder 06/10/2010  ? Unspecified urinary incontinence 06/10/2010  ? Hemiplegia ([place]) 06/09/2010  ?  ?Past Medical History:  ?Diagnosis Date  ? Anxiety   ? 07/2019  ? Arthritis   ? Depression 08/23/2019  ? History of chicken pox   ? Hyperlipidemia   ? 2015  ? Neurologic gait disorder 07/24/2013  ? Chronic post stroke  ? Stroke [facility])   ? 2006  ?  ?Family History  ?Problem Relation Age of Onset  ? Healthy Sister   ? Healthy Sister   ? Healthy Brother   ? Healthy Brother   ? Stomach cancer Neg Hx   ? Rectal cancer Neg Hx   ? Pancreatic cancer Neg Hx   ? Colon cancer Neg Hx   ? Colon polyps Neg Hx   ? Esophageal cancer Neg Hx   ? ?Past Surgical History:  ?Procedure Laterality Date  ? cervical staph infection    ? 2006  ? COLONOSCOPY    ? 2015  ? INGUINAL HERNIA REPAIR    ? kindergarten  ? IR ANGIOGRAM EXTREMITY RIGHT  10/09/2020  ? IR INTRAVASCULAR ULTRASOUND NON CORONARY  10/13/2020  ? IR RADIOLOGIST EVAL & MGMT  09/24/2020  ? IR RADIOLOGIST EVAL & MGMT  10/27/2020  ? IR US GUIDE VASC ACCESS RIGHT  10/09/2020  ? ?Social History  ? ?Social History Narrative  ? Regular exercise-no  ? Caffeine Use-no  ? Right handed 12/25/20 report he is not able to write his name well at all now  ? ?Immunization History  ?Administered Date(s) Administered  ? Influenza Inj Mdck Quad Pf 12/09/2017  ? Influenza Split 01/05/2021  ? Influenza,inj,Quad PF,6+ Mos 02/04/2014, 01/20/2015, 12/23/2015, 12/13/2016, 12/10/2018, 12/26/2020  ? Influenza-Unspecified 03/29/2011, 02/02/2012, 03/28/2018, 12/30/2019  ? PFIZER Comirnaty(Gray  Top)Covid-19 Tri-Sucrose Vaccine 09/11/2020  ? PFIZER(Purple Top)SARS-COV-2 Vaccination 06/25/2019, 07/16/2019, 03/11/2020  ? Td 12/23/2015  ? Tdap 03/28/2009, 09/20/2017, 10/29/2018  ? Zoster Recombinat (Shingrix) 10/29/2018, 04/21/2019  ? Zoster, Live 10/29/2018, 04/21/2019  ?  ? ?Objective: ?Vital Signs: There were no vitals taken for this visit.  ? ?Physical Exam  ? ?Musculoskeletal Exam: *** ? ?CDAI Exam: ?CDAI Score: -- ?Patient Global: --; Provider Global: -- ?Swollen: --; Tender: -- ?Joint Exam 05/26/2021  ? ?No joint exam has been documented for this visit  ? ?There is currently no information documented on the homunculus. Go to the Rheumatology activity and complete the homunculus joint exam. ? ?Investigation: ?No additional findings. ? ?Imaging: ?No results found. ? ?Recent Labs: ?Lab Results  ?Component Value Date  ? WBC 3.9 (L) 10/09/2020  ? HGB 14.9 10/09/2020  ? PLT 237 10/09/2020  ? NA 135 10/09/2020  ? K 4.2 10/09/2020  ? CL 104 10/09/2020  ? CO2 22  10/09/2020  ? GLUCOSE 92 10/09/2020  ? BUN 18 10/09/2020  ? CREATININE 1.64 (H) 10/09/2020  ? BILITOT 1.0 05/19/2020  ? ALKPHOS 74 05/19/2020  ? AST 14 05/19/2020  ? ALT 14 05/19/2020  ? PROT 6.5 11/02/2020  ? ALBUMIN 4.2 05/19/2020  ? CALCIUM 9.2 10/09/2020  ? ? ?Speciality Comments: No specialty comments available. ? ?Procedures:  ?No procedures performed ?Allergies: Patient has no known allergies.  ? ?Assessment / Plan:     ?Visit Diagnoses: No diagnosis found. ? ?*** ? ?Orders: ?No orders of the defined types were placed in this encounter. ? ?No orders of the defined types were placed in this encounter. ? ? ? ?Follow-Up Instructions: No follow-ups on file. ? ? ?Collier Salina, MD ? ?Note - This record has been created using [employer].  ?Chart creation errors have been sought, but may not always  ?have been located. Such creation errors do not reflect on  ?the standard of medical care. ? ?

## 2021-06-01 ENCOUNTER — Ambulatory Visit: Payer: Self-pay | Admitting: *Deleted

## 2021-06-02 DIAGNOSIS — F32A Depression, unspecified: Secondary | ICD-10-CM | POA: Diagnosis not present

## 2021-06-02 DIAGNOSIS — I7300 Raynaud's syndrome without gangrene: Secondary | ICD-10-CM | POA: Diagnosis not present

## 2021-06-02 DIAGNOSIS — I69354 Hemiplegia and hemiparesis following cerebral infarction affecting left non-dominant side: Secondary | ICD-10-CM | POA: Diagnosis not present

## 2021-06-02 DIAGNOSIS — M5136 Other intervertebral disc degeneration, lumbar region: Secondary | ICD-10-CM | POA: Diagnosis not present

## 2021-06-15 ENCOUNTER — Ambulatory Visit: Payer: Medicare Other | Admitting: Internal Medicine

## 2021-06-28 DIAGNOSIS — I69354 Hemiplegia and hemiparesis following cerebral infarction affecting left non-dominant side: Secondary | ICD-10-CM | POA: Diagnosis not present

## 2021-06-28 DIAGNOSIS — I776 Arteritis, unspecified: Secondary | ICD-10-CM | POA: Diagnosis not present

## 2021-06-28 DIAGNOSIS — Z1159 Encounter for screening for other viral diseases: Secondary | ICD-10-CM | POA: Diagnosis not present

## 2021-06-28 DIAGNOSIS — Z0000 Encounter for general adult medical examination without abnormal findings: Secondary | ICD-10-CM | POA: Diagnosis not present

## 2021-06-28 DIAGNOSIS — K5900 Constipation, unspecified: Secondary | ICD-10-CM | POA: Diagnosis not present

## 2021-06-28 DIAGNOSIS — E785 Hyperlipidemia, unspecified: Secondary | ICD-10-CM | POA: Diagnosis not present

## 2021-06-28 DIAGNOSIS — M5136 Other intervertebral disc degeneration, lumbar region: Secondary | ICD-10-CM | POA: Diagnosis not present

## 2021-06-28 DIAGNOSIS — J309 Allergic rhinitis, unspecified: Secondary | ICD-10-CM | POA: Diagnosis not present

## 2021-06-28 DIAGNOSIS — I7300 Raynaud's syndrome without gangrene: Secondary | ICD-10-CM | POA: Diagnosis not present

## 2021-06-29 ENCOUNTER — Other Ambulatory Visit: Payer: Self-pay | Admitting: *Deleted

## 2021-06-29 ENCOUNTER — Encounter: Payer: Self-pay | Admitting: *Deleted

## 2021-06-29 DIAGNOSIS — M5416 Radiculopathy, lumbar region: Secondary | ICD-10-CM | POA: Diagnosis not present

## 2021-06-29 NOTE — Patient Outreach (Signed)
[facility]) Care Management ?Telephonic RN Care Manager Note ? ? ?06/30/2021 ?Name:  Danny Hopkins MRN:  [id number] DOB:  Jun 24, 1960 ? ?Summary: ?Successful outreach ?Danny Hopkins reports he is doing better today  ?He confirms he has recently arrived home from attending a pain management appointment for "back injection" He reports the injection has not started to work yet but feels "okay"  ?He reported he Pcp on 06/28/21  ?[facility] discharged him felt beneficial this time ?He reports sots  ?Do not get out much groceries use to go to group neighbor mop & vacuum for him ? ?Recommendations/Changes made from today's visit: ?Assessed for worsening symptoms ?Encourage not to wait so long for injection/therapy ?reviewed quarterly/bi yearly pain management injections ?Discussed home health therapy yearly options with assist from pcp ?Discuss cold weather related to pain ?Depression screen ? ?  06/29/2021  ?  1:28 PM  ?Depression screen PHQ 2/9  ?Decreased Interest 1  ?Down, Depressed, Hopeless 0  ?PHQ - 2 Score 1  ? ? ?Discussed Higher education careers adviser of [place] -e-mailed patient resources  to encourage him to remain active/socialization/possible volunteer home assistance or telephonic outreaches  ?Sent e-mail with resource information  ? ? ?Subjective: ?Danny Hopkins is an 61 y.o. year old male who is a primary patient of Danny Stains, MD. The care management team was consulted for assistance with care management and/or care coordination needs.   ? ?Telephonic RN Care Manager completed Telephone Visit today.  ? ?Objective: ? ?Medications Reviewed Today   ? ? Reviewed by Barbaraann Faster, RN (Registered Nurse) on 06/30/21 at 2307  Med List Status: <None>  ? ?Medication Order Taking? Sig Documenting Provider Last Dose Status Informant  ?ALPRAZolam (XANAX) 1 MG tablet [id number] No TAKE 1 TABLET IN THE MORNING AND 1 AND 1/2 TABLETS IN THE EVENING AS NEEDED Biagio Borg, MD Taking Active   ?amLODipine  (NORVASC) 10 MG tablet [id number]  TAKE 1 TABLET BY MOUTH EVERY DAY Rice, Resa Miner, MD  Active   ?atorvastatin (LIPITOR) 20 MG tablet [id number] No TAKE 1 TABLET BY MOUTH EVERY DAY  ?Patient taking differently: Take 20 mg by mouth daily.  ? Biagio Borg, MD Taking Active   ?baclofen (LIORESAL) 10 MG tablet [id number] No Take 10 mg by mouth daily as needed (Back spasms). [provider] Taking Active Self  ?Cholecalciferol (VITAMIN D3) 50 MCG (2000 UT) TABS [id number] No Take 2,000 Units by mouth daily. [provider] Taking Active Self  ?clotrimazole-betamethasone (LOTRISONE) cream [id number] No APPLY TO AFFECTED AREA TWICE A DAY Biagio Borg, MD Taking Active   ?docusate sodium (COLACE) 100 MG capsule [id number] No Take 200 mg by mouth 2 (two) times daily. [provider] Taking Active Self  ?fluticasone (FLONASE) 50 MCG/ACT nasal spray [id number] No Place 2 sprays into both nostrils daily. [provider] Taking Active Self  ?HYDROcodone-acetaminophen (NORCO) 10-325 MG per tablet [id number] No Take 1 tablet by mouth every 6 (six) hours. [provider] Taking Active Self  ?hydrocortisone (ANUSOL-HC) 2.5 % rectal cream [id number] No Apply 1 application topically 2 (two) times daily. [provider] Taking Active   ?MOVANTIK 25 MG TABS tablet [id number] No Take 1 tablet (25 mg total) by mouth every morning. Zehr, Laban Emperor, PA-C Taking Active   ?naproxen (NAPROSYN) 500 MG tablet [id number] No TAKE 1 TABLET (500 MG TOTAL) BY MOUTH 2 (TWO) TIMES DAILY AS NEEDED FOR HEADACHE. Biagio Borg, MD Taking Active Self  ?omeprazole (  PRILOSEC) 20 MG capsule [id number]  TAKE 1 CAPSULE BY MOUTH EVERY DAY AS NEEDED Biagio Borg, MD  Active   ?SUMAtriptan (IMITREX) 100 MG tablet [id number] No Take 1 tablet (100 mg total) by mouth every 2 (two) hours as needed for migraine or headache. May repeat in 2 hours if headache persists or recurs.  ?Patient not taking: Reported on 02/15/2021   ? Biagio Borg, MD Not Taking Active Self  ?tamsulosin (FLOMAX) 0.4 MG CAPS capsule [id number] No TAKE 1 CAPSULE BY MOUTH EVERY DAY  ?Patient taking differently: Take 0.4 mg by mouth daily.  ? Biagio Borg, MD Taking Active   ? ?  ?  ? ?  ? ? ? ?SDOH:  (Social Determinants of Health) assessments and interventions performed:  ?SDOH Interventions   ? ?Flowsheet Row Most Recent Value  ?SDOH Interventions   ?Food Insecurity Interventions Intervention Not Indicated  ?Transportation Interventions Intervention Not Indicated  ? ?  ? ? ?Care Plan ? ?Review of patient past medical history, allergies, medications, health status, including review of consultants reports, laboratory and other test data, was performed as part of comprehensive evaluation for care management services.  ? ?Care Plan : RN Care Manager Plan of Care  ?Updates made by Barbaraann Faster, RN since 06/30/2021 12:00 AM  ?  ? ?Problem: Complex Care Coordination Needs and disease management in patient with chronic pain, stroke, mobility concerns   ?Priority: High  ?  ? ?Long-Range Goal: Establish Plan of Care for Management Complex SDOH Barriers, disease management and Care Coordination Needs in patient with chronic pain, stroke, mobility concerns   ?Start Date: 02/10/2021  ?This Visit's Progress: On track  ?Recent Progress: On track  ?Priority: High  ?Note:   ?Current Barriers:  ?Knowledge Deficits related to plan of care for management of chronic pain, stroke, mobility concerns ?Care Coordination needs related to ADL IADL limitations, Limited education about chronic pain, stroke, mobility concerns*, and Lacks knowledge of community resource: chronic pain, stroke, mobility personal care services concerns ?Mobility, health behaviors ?Interest in personal care services -has completed medicaid application pending response, various call attempts to social services unsuccessfully ?03/15/21 Understanding of DSS forms/letters/medicaid process ? ?RN CM Clinical  Goal(s):  ?Patient will verbalize understanding of plan for management of chronic pain, stroke, mobility concerns ?work with [place]  to complete medicaid application  through collaboration with Consulting civil engineer, provider, and care team. - Goal met 03/10/21 confirms completion of medicaid application pending approval response ?03/15/21 patient will verbalize understanding of DSS letters and be connected with DSS staff for further resources ? ?Interventions: ?Follow up with patient as agree to re assess needs ?Inter-disciplinary care team collaboration (see longitudinal plan of care) ?Evaluation of current treatment plan related to  self management and patient's adherence to plan as established by provider ?03/10/21 Assist with conference and non conference outreach to [place] for medicaid application status.  ?RN CM left a message for Abbi Gill at [phone] listed as th medicaid review manager Requesting the status of pt medicaid application and possible assist with personal care services Discussed extend wait time with calling main DSS line and left pt and RN CM numbers  ?Used teach back method to clarify that he understood the second recent letter he received. This letter stated he has full medicaid and his recent medicaid application was closed. RN CM and patient completed a conference call to leave a message for Mrs [name] (reported by Mrs Rosita Fire to  be patient's assigned DSS staff) for more clarity,  questions and assist with personal care services.  ?Explained to him the importance of him asking Mrs [name] about his specific medicaid and if it covers personal care services if she calls him He voices understanding ?03/19/21 another message left for DSS staff, [place], Follow up with patient, updated him ?04/09/21 care coordination with pcp referral staff Enid Derry about [facility], PCS , updated patient, encourage him to continue outreaches/interventions with DSS/CAPs staff ?Educated him again he has to  have full medicaid to get PCS, or pay out of pocket for PCS but has option of UHC covered PT/OT limited sessions ?04/26/21 provided DSS SW number ([place]) [phone] Reviewed PCS cost without medicaid/out of poc

## 2021-07-19 NOTE — Progress Notes (Signed)
Office Visit Note  Patient: Danny Hopkins             Date of Birth: Feb 12, 1961           MRN: [id number]             PCP: Harlan Stains, MD Referring: Harlan Stains, MD Visit Date: 07/20/2021   Subjective:  Follow-up (Doing good)   History of Present Illness: Danny Hopkins is a 61 y.o. male here for follow up for raynaud's/peripheral vascular insufficiency after increasing amlodipine dose to 10 mg daily. He has some improvement in symptoms no new lesions although he still sees blue discoloration in fingers intermittently. He has noticed several episodes of lightheadedness when standing and 3 times had to lie back down and wait to recover his symptoms.  Previous HPI 02/15/21 Danny Hopkins is a 61 y.o. male here for follow up for peripheral vascular insufficiency with ulceration of the right 4th finger and was started on amlodipine 5 mg daily after negative workup of systemic vasculitis antibodies. Since our last visit fingers are doing overall better he is noticing increase in blue discoloration of 5th fingers on both hands with the colder weather. His right 3rd finger has developed a few spots with redness in the finger and nail. His follow up in primary care clinic has shown some borderline low BP on the amlodipine.   Previous HPI: 11/02/20 Danny Hopkins is a 61 y.o. male here for evaluation for vasculitis with digital ulcer of right 4th finger. He has felt in overall usual health but had bene noticing cold and numbness changes in his fingertips for months affecting the right 3-5th fingers. He developed a wound on the tip of his 4th finger in June he does not remember a particular injury or maybe a small abrasion starting this. He did not think it was severe but subsequently developed black discoloration of his whole fingertip peeling of the skin. He saw Dr. Burney Gauze for evaluation and subsequent upper extremity arteriography with findings of no patent palmar arch and  diminished digital artery perfusions with normal proximal vasculature up to the wrist. Otherwise he denies feeling other significant symptom or medical changes lately. No skin rashes, lesions, no discoloration or ulceration of other digits or in the past. He has chronic lumbar spine degenerative arthritis with radiculopathy. He has chronic left sided residual weakness due to previous stroke. Workup for the stroke reportedly without any specific secondary underlying cause identified.   Labs reviewed 09/2020 CBC wnl BMP eGFR 48 UA contaminated, +Ca Oxalate, +glucose   Imaging reviewed 10/09/20 RUE arteriogram IMPRESSION:  Status post ultrasound guided access right common femoral artery for right upper extremity angiogram.  Findings reveal no significant atherosclerotic changes, with no evidence to suggest atheroembolic phenomenon as the source of the fourth digit ulcer, as the arteries are of normal course caliber and contour to the wrist.  The angiographic findings of the right hand are most likely a manifestation of either Buerger's disease (thrombo angiitis obliterans) or other connective tissue disorder/rheumatologic disorder. There was a mild improvement with administration of  nitroglycerin/vasodilators.   10/05/20 LE Vascular ultrasound IMPRESSION: 1. Normal bilateral resting ankle-brachial indices. 2. Markedly blunted digital waveforms bilaterally suggests small vessels disease.   Review of Systems  Constitutional:  Positive for fatigue.  HENT:  Negative for mouth dryness.   Eyes:  Negative for dryness.  Respiratory:  Negative for shortness of breath.   Cardiovascular:  Negative for swelling in legs/feet.  Gastrointestinal:  Positive for constipation.  Endocrine: Negative for increased urination.  Genitourinary:  Negative for difficulty urinating.  Musculoskeletal:  Positive for gait problem.  Skin:  Negative for rash.  Allergic/Immunologic: Negative for susceptible to infections.   Neurological:  Negative for numbness.  Hematological:  Negative for bruising/bleeding tendency.  Psychiatric/Behavioral:  Negative for sleep disturbance.    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 ([place]) 02/15/2021   Therapeutic opioid induced constipation 01/21/2021   Rectal bleeding 01/21/2021   Internal hemorrhoids 01/21/2021   Vasculitis ([place]) 11/02/2020   Lumbar radiculopathy 09/17/2020   Allergic rhinitis 08/23/2020   Acute dysfunction of right eustachian tube 08/23/2020   Dysphagia 06/10/2020   Sore in nose 05/21/2020   Weight loss 05/19/2020   Left arm pain 04/19/2020   Migraines 01/07/2020   Depression 08/23/2019   Spasticity 05/29/2019   Vitamin D deficiency 08/25/2018   Laceration of left hand 09/27/2017   Acute pharyngitis 07/14/2017   Chronic tonsillitis 07/12/2017   Chronic pain syndrome 06/22/2017   Bradycardia 06/22/2017   Abnormal ECG 06/22/2017   Former smoker 06/21/2016   Scalp pain 07/30/2015   Elevated blood-pressure reading, without diagnosis of hypertension 06/24/2015   Dandruff 06/24/2015   Lymphadenitis 06/24/2015   Burning with urination 01/25/2015   Balanitis 01/25/2015   Sleeping difficulties 02/04/2014   Bladder neck obstruction 02/04/2014   Hyperlipidemia 02/04/2014   Anterior spinal artery compression syndrome 10/10/2013   Other malaise and fatigue 07/24/2013   Stroke ([place]) 07/24/2013   Encounter for well adult exam with abnormal findings 07/24/2013   Neurologic gait disorder 07/24/2013   Other intervertebral disc displacement, lumbar region 05/29/2013   Cervical spondylosis with myelopathy 01/30/2013   Insomnia 10/12/2012    Generalized anxiety disorder 10/12/2012   Eczema 10/12/2012   History of stroke 10/12/2012   Corn or callus 07/07/2010   ED (erectile dysfunction) of organic origin 07/07/2010   Neurogenic bladder 06/10/2010   Unspecified urinary incontinence 06/10/2010   Hemiplegia ([place]) 06/09/2010    Past Medical History:  Diagnosis Date   Anxiety    07/2019   Arthritis    Depression 08/23/2019   History of chicken pox    Hyperlipidemia    2015   Neurologic gait disorder 07/24/2013   Chronic post stroke   Stroke [facility])    2006    Family History  Problem Relation Age of Onset   Healthy Sister    Healthy Sister    Healthy Brother    Healthy Brother    Stomach cancer Neg Hx    Rectal cancer Neg Hx    Pancreatic cancer Neg Hx    Colon cancer Neg Hx    Colon polyps Neg Hx    Esophageal cancer Neg Hx    Past Surgical History:  Procedure Laterality Date   cervical staph infection     2006   COLONOSCOPY     2015   INGUINAL HERNIA REPAIR     kindergarten   IR ANGIOGRAM EXTREMITY RIGHT  10/09/2020   IR INTRAVASCULAR ULTRASOUND NON CORONARY  10/13/2020   IR RADIOLOGIST EVAL & MGMT  09/24/2020   IR RADIOLOGIST EVAL & MGMT  10/27/2020   IR US GUIDE VASC ACCESS RIGHT  10/09/2020   Social History   Social History Narrative   Regular exercise-no   Caffeine Use-no   Right handed 12/25/20 report he is not able to write his name well at all now   Immunization History  Administered Date(s) Administered   Influenza Inj Mdck Quad Pf 12/09/2017   Influenza Split 01/05/2021   Influenza,inj,Quad PF,6+ Mos 02/04/2014, 01/20/2015, 12/23/2015, 12/13/2016, 12/10/2018, 12/26/2020   Influenza-Unspecified 03/29/2011, 02/02/2012, 03/28/2018, 12/30/2019   PFIZER Comirnaty(Gray Top)Covid-19 Tri-Sucrose Vaccine 09/11/2020   PFIZER(Purple Top)SARS-COV-2 Vaccination 06/25/2019, 07/16/2019, 03/11/2020, 09/11/2020   Td 12/23/2015   Tdap 03/28/2009, 09/20/2017, 10/29/2018   Zoster Recombinat (Shingrix) 10/29/2018,  04/21/2019   Zoster, Live 10/29/2018, 04/21/2019     Objective: Vital Signs: BP 96/64 (BP Location: Left Arm, Patient Position: Standing, Cuff Size: Normal)   Pulse 83   Resp 16   Ht _0  (1.702 m)   Wt 126 lb (57.2 kg)   BMI 19.73 kg/m    Physical Exam Cardiovascular:     Rate and Rhythm: Normal rate and regular rhythm.  Pulmonary:     Effort: Pulmonary effort is normal.     Breath sounds: Normal breath sounds.  Musculoskeletal:     Right lower leg: No edema.     Left lower leg: No edema.  Skin:    Comments: Distal fingers blue throughout right hand, with fingernail mildly cyanotic on 3rd digit, no pitting or ulceration Left hand minimal acral violaceous color  Neurological:     Mental Status: He is alert.  Psychiatric:        Mood and Affect: Mood normal.     Musculoskeletal Exam:  Wrists full ROM no tenderness or swelling Right hand fingers normal, left hand partial flexion contracture  Blood pressure repeated in standing position was 96/64  Investigation: No additional findings.  Imaging: No results found.  Recent Labs: Lab Results  Component Value Date   WBC 3.9 (L) 10/09/2020   HGB 14.9 10/09/2020   PLT 237 10/09/2020   NA 135 10/09/2020   K 4.2 10/09/2020   CL 104 10/09/2020   CO2 22 10/09/2020   GLUCOSE 92 10/09/2020   BUN 18 10/09/2020   CREATININE 1.64 (H) 10/09/2020   BILITOT 1.0 05/19/2020   ALKPHOS 74 05/19/2020   AST 14 05/19/2020   ALT 14 05/19/2020   PROT 6.5 11/02/2020   ALBUMIN 4.2 05/19/2020   CALCIUM 9.2 10/09/2020    Speciality Comments: No specialty comments available.  Procedures:  No procedures performed Allergies: Patient has no known allergies.   Assessment / Plan:     Visit Diagnoses: Raynaud's disease with gangrene ([place]) - Plan: amLODipine (NORVASC) 5 MG tablet  Symptoms benefiting with calcium channel blocker but appears to be suffering orthostatic hypotension symptoms on amlodipine 10 mg.  Recommend he decrease  the dose back down to 5 mg daily.  He was not reporting significant side effects at that dose.  If not improving with the dose reduction could consider switching to PDE 5 inhibitor.  We will also plan to follow-up around cooler weather months later this year to assess disease severity.  Vasculitis ([place]) - Plan: amLODipine (NORVASC) 5 MG tablet  No new evidence of critical digital ischemia or chronic changes from peripheral circulation deficit.  Still has some faint pallor or acrocyanosis type changes present.  No evidence for systemic vasculitis on serum inflammatory markers or other involved areas.  Orders: No orders of the defined types were placed in this encounter.  Meds ordered this encounter  Medications   amLODipine (NORVASC) 5 MG tablet    Sig: Take 1 tablet (5 mg total) by mouth daily.    Dispense:  90 tablet    Refill:  0     Follow-Up Instructions: Return in about 7 months (around 02/19/2022) for Raynaud's/Vascular on CCB.   Collier Salina, MD  Note - This record has been created using [employer].  Chart creation errors have been sought, but may not always  have been located. Such creation errors do not reflect on  the standard of medical care.

## 2021-07-20 ENCOUNTER — Encounter: Payer: Self-pay | Admitting: Internal Medicine

## 2021-07-20 ENCOUNTER — Ambulatory Visit (INDEPENDENT_AMBULATORY_CARE_PROVIDER_SITE_OTHER): Payer: Medicare Other | Admitting: Internal Medicine

## 2021-07-20 VITALS — BP 96/64 | HR 83 | Resp 16 | Ht 67.0 in | Wt 126.0 lb

## 2021-07-20 DIAGNOSIS — I776 Arteritis, unspecified: Secondary | ICD-10-CM

## 2021-07-20 DIAGNOSIS — I7301 Raynaud's syndrome with gangrene: Secondary | ICD-10-CM

## 2021-07-20 MED ORDER — AMLODIPINE BESYLATE 5 MG PO TABS: 5.0000 mg | ORAL_TABLET | Freq: Every day | ORAL | 0 refills | Status: DC

## 2021-07-25 ENCOUNTER — Other Ambulatory Visit: Payer: Self-pay | Admitting: Internal Medicine

## 2021-08-02 ENCOUNTER — Other Ambulatory Visit: Payer: Self-pay | Admitting: Internal Medicine

## 2021-08-02 DIAGNOSIS — I7301 Raynaud's syndrome with gangrene: Secondary | ICD-10-CM

## 2021-08-02 DIAGNOSIS — I776 Arteritis, unspecified: Secondary | ICD-10-CM

## 2021-08-02 NOTE — Telephone Encounter (Signed)
Please refill as per office routine med refill policy (all routine meds to be refilled for 3 mo or monthly (per pt preference) up to one year from last visit, then month to month grace period for 3 mo, then further med refills will have to be denied) ? ?

## 2021-08-04 ENCOUNTER — Other Ambulatory Visit: Payer: Self-pay | Admitting: *Deleted

## 2021-08-04 DIAGNOSIS — M5416 Radiculopathy, lumbar region: Secondary | ICD-10-CM

## 2021-08-04 DIAGNOSIS — R269 Unspecified abnormalities of gait and mobility: Secondary | ICD-10-CM

## 2021-08-04 DIAGNOSIS — I639 Cerebral infarction, unspecified: Secondary | ICD-10-CM

## 2021-08-04 DIAGNOSIS — M4712 Other spondylosis with myelopathy, cervical region: Secondary | ICD-10-CM

## 2021-08-04 DIAGNOSIS — I7301 Raynaud's syndrome with gangrene: Secondary | ICD-10-CM

## 2021-08-04 NOTE — Patient Outreach (Signed)
[facility]) Care Management ?Telephonic RN Care Manager Note ? ? ?08/06/2021 ?Name:  Danny Hopkins MRN:  [id number] DOB:  [birth year] ? ?Summary: ?Follow up outreach to patient ?He reports his symptoms are progressing and worsening since the last RN CM outreach  ?He reports today he went to get some food today but upon his return his legs gave out. He reports having to crawl into his apartment.   ?Neighbor cleans home weekly & Friend from church are his support system ?Found out last week he is allergic to oatmeal on 07/29/21  ?He reports his pcp is now assisting with his pain management services  ? ?Recommendations/Changes made from today's visit: ?Assessed needs , concerns since last outreach ?Discussed Department of Social Services (DSS) Community Alternatives Programs (CAP) not being a Risk analyst CM can intervene with Answered questions as much as possible  ?Encouraged him to outreach every 2 weeks to DSS for follow up during the 90 day period he was informed about  ?Review his options to have visits with his pcp (telephonic, virtual, office) ?Added oatmeal to his allergy list with his permission  ?To send care guide referral for food insecurity, call to place on [place] meals on wheels program list for alternative/plan B to assist pt with concern episodes like today (going out to get food and having to crawl back in home hx of stroke, raynaud's disease, chronic lumbar spine degenerative arthritis with radiculopathy)  ? ? ?Subjective: ?Danny Hopkins is an 61 y.o. year old male who is a primary patient of Harlan Stains, MD. The care management team was consulted for assistance with care management and/or care coordination needs.   ? ?Telephonic RN Care Manager completed Telephone Visit today.  ? ?Objective: ? ?Medications Reviewed Today   ? ? Reviewed by Collier Salina, MD (Physician) on 07/20/21 at 1157  Med List Status: <None>  ? ?Medication Order Taking? Sig Documenting  Provider Last Dose Status Informant  ?ALPRAZolam (XANAX) 1 MG tablet [id number] Yes TAKE 1 TABLET IN THE MORNING AND 1 AND 1/2 TABLETS IN THE EVENING AS NEEDED Biagio Borg, MD Taking Active   ?amLODipine (NORVASC) 5 MG tablet [id number]  Take 1 tablet (5 mg total) by mouth daily. Collier Salina, MD  Active   ?atorvastatin (LIPITOR) 20 MG tablet [id number] Yes TAKE 1 TABLET BY MOUTH EVERY DAY  ?Patient taking differently: Take 20 mg by mouth daily.  ? Biagio Borg, MD Taking Active   ?baclofen (LIORESAL) 10 MG tablet [id number] Yes Take 10 mg by mouth daily as needed (Back spasms). [provider] Taking Active Self  ?Cholecalciferol (VITAMIN D3) 50 MCG (2000 UT) TABS [id number] Yes Take 2,000 Units by mouth daily. [provider] Taking Active Self  ?clotrimazole-betamethasone (LOTRISONE) cream [id number] No APPLY TO AFFECTED AREA TWICE A DAY  ?Patient not taking: Reported on 07/20/2021  ? Biagio Borg, MD Not Taking Active   ?docusate sodium (COLACE) 100 MG capsule [id number] Yes Take 200 mg by mouth 2 (two) times daily. [provider] Taking Active Self  ?fluticasone (FLONASE) 50 MCG/ACT nasal spray [id number] Yes Place 2 sprays into both nostrils daily. [provider] Taking Active Self  ?HYDROcodone-acetaminophen (NORCO) 10-325 MG per tablet [id number] No Take 1 tablet by mouth every 6 (six) hours.  ?Patient not taking: Reported on 07/20/2021  ? [provider] Not Taking Active Self  ?hydrocortisone (ANUSOL-HC) 2.5 % rectal cream [id number] No Apply 1 application topically 2 (two) times  daily.  ?Patient not taking: Reported on 07/20/2021  ? [provider] Not Taking Active   ?MOVANTIK 25 MG TABS tablet [id number] Yes Take 1 tablet (25 mg total) by mouth every morning. Zehr, Laban Emperor, PA-C Taking Active   ?naproxen (NAPROSYN) 500 MG tablet [id number] No TAKE 1 TABLET (500 MG TOTAL) BY MOUTH 2 (TWO) TIMES DAILY AS NEEDED FOR HEADACHE.  ?Patient not taking:  Reported on 07/20/2021  ? Biagio Borg, MD Not Taking Active Self  ?omeprazole (PRILOSEC) 20 MG capsule [id number] Yes TAKE 1 CAPSULE BY MOUTH EVERY DAY AS NEEDED Biagio Borg, MD Taking Active   ?oxyCODONE-acetaminophen (PERCOCET) 7.5-325 MG tablet [id number] Yes take 1 tablet by oral route  every 6 hours as needed DNF (07/02/21) [provider] Taking Active   ?sertraline (ZOLOFT) 50 MG tablet [id number] Yes Take 50 mg by mouth daily. [provider] Taking Active   ?SUMAtriptan (IMITREX) 100 MG tablet [id number] Yes Take 1 tablet (100 mg total) by mouth every 2 (two) hours as needed for migraine or headache. May repeat in 2 hours if headache persists or recurs. Biagio Borg, MD Taking Active Self  ?tamsulosin (FLOMAX) 0.4 MG CAPS capsule [id number] Yes TAKE 1 CAPSULE BY MOUTH EVERY DAY  ?Patient taking differently: Take 0.4 mg by mouth daily.  ? Biagio Borg, MD Taking Active   ? ?  ?  ? ?  ? ? ? ?SDOH:  (Social Determinants of Health) assessments and interventions performed:  ?SDOH Interventions   ? ?Flowsheet Row Most Recent Value  ?SDOH Interventions   ?Financial Strain Interventions Intervention Not Indicated  ?Transportation Interventions Intervention Not Indicated  ? ?  ? ? ?Care Plan ? ?Review of patient past medical history, allergies, medications, health status, including review of consultants reports, laboratory and other test data, was performed as part of comprehensive evaluation for care management services.  ? ?Care Plan : RN Care Manager Plan of Care  ?Updates made by Barbaraann Faster, RN since 08/06/2021 12:00 AM  ?  ? ?Problem: Complex Care Coordination Needs and disease management in patient with chronic pain, stroke, mobility concerns   ?Priority: High  ?  ? ?Long-Range Goal: Establish Plan of Care for Management Complex SDOH Barriers, disease management and Care Coordination Needs in patient with chronic pain, stroke, mobility concerns   ?Start Date: 02/10/2021  ?Recent  Progress: On track  ?Priority: High  ?Note:   ?Current Barriers:  ?Knowledge Deficits related to plan of care for management of chronic pain, stroke, mobility concerns ?Care Coordination needs related to ADL IADL limitations, Limited education about chronic pain, stroke, mobility concerns*, and Lacks knowledge of community resource: chronic pain, stroke, mobility personal care services concerns ?Mobility, health behaviors ?Interest in personal care services -has completed medicaid application pending response, various call attempts to social services unsuccessfully ?03/15/21 Understanding of DSS forms/letters/medicaid process ? ?RN CM Clinical Goal(s):  ?Patient will verbalize understanding of plan for management of chronic pain, stroke, mobility concerns ?work with [place]  to complete medicaid application  through collaboration with Consulting civil engineer, provider, and care team. - Goal met 03/10/21 confirms completion of medicaid application pending approval response ?03/15/21 patient will verbalize understanding of DSS letters and be connected with DSS staff for further resources ? ?Interventions: ?Follow up with patient as agree to re assess needs ?Inter-disciplinary care team collaboration (see longitudinal plan of care) ?Evaluation of current treatment plan related to  self management and patient's adherence to plan as  established by provider ?03/10/21 Assist with conference and non conference outreach to [place] for medicaid application status.  ?RN CM left a message for Abbi Gill at [phone] listed as th medicaid review manager Requesting the status of pt medicaid application and possible assist with personal care services Discussed extend wait time with calling main DSS line and left pt and RN CM numbers  ?Used teach back method to clarify that he understood the second recent letter he received. This letter stated he has full medicaid and his recent medicaid application was closed. RN CM and  patient completed a conference call to leave a message for Mrs [name] (reported by Mrs Rosita Fire to be patient's assigned DSS staff) for more clarity,  questions and assist with personal care services.  ?Explained to

## 2021-08-06 ENCOUNTER — Other Ambulatory Visit: Payer: Self-pay | Admitting: *Deleted

## 2021-08-06 NOTE — Patient Outreach (Signed)
[facility]) Care Management ? ?08/06/2021 ? ?Danny Hopkins ?1960/10/01 ?[id number] ? ?[place] coordination- food insecurity intervention ? ?Left a voice message at [phone] to request a return call to assist with placing patient on meals on wheels program ?Left RN CM outreach number  ? ? ?Plan ?[facility] RN CM will follow up with patient within the next 30+ business days ? ? ? ?Sharlynn Seckinger L. [name], RN, BSN, CCM ?[place] Management Care Coordinator ?Office number [phone] ? ?

## 2021-08-09 ENCOUNTER — Other Ambulatory Visit: Payer: Self-pay | Admitting: *Deleted

## 2021-08-09 ENCOUNTER — Telehealth: Payer: Self-pay

## 2021-08-09 NOTE — Telephone Encounter (Signed)
? ?  Telephone encounter was:  Unsuccessful.  08/09/2021 ?Name: Danny Hopkins MRN: [id number] DOB: [birth year] ? ?Unsuccessful outbound call made today to assist with:  Food Insecurity ? ?Outreach Attempt:  1st Attempt ? ?A HIPAA compliant voice message was left requesting a return call.  Instructed patient to call back at earliest convenience. ?. ? ? ? ?Larena Sox ?Care Guide, Embedded Care Coordination ?[place], Care Management  ?[phone] ?[address], [place], [place] [address] ?Phone: [phone] ?Email: [email or web address] ? ?  ?

## 2021-08-09 NOTE — Patient Outreach (Signed)
[facility]) Care Management ? ?08/09/2021 ? ?[name] ?1960/09/04 ?[id number] ? ? ?[place] coordination- meals on wheels ? ?Received a voice message from [place]'s meals on wheels program ?Received her voice mail box and a confidential voice message was left to include pt and RN CM outreach numbers. Also noted pt age of 81 as of 2023  ?Returned a call to her about Mr Strojny's meals on wheels information  ? ?Plan ?[facility]N RN CM will follow up with patient within the next 30+ business days ? ? ? ?[place] L. [name], RN, BSN, CCM ?[place] Management Care Coordinator ?Office number [phone] ? ?

## 2021-08-10 ENCOUNTER — Other Ambulatory Visit: Payer: Self-pay | Admitting: *Deleted

## 2021-08-10 NOTE — Patient Outreach (Signed)
[facility]) Care Management ? ?08/10/2021 ? ?[name] ?Apr 30, 1960 ?[id number] ? ? ?[facility] Care coordination-meals on wheels ? ?Incoming call from [employer] [place] meals on wheels to discuss pt ?Pt is age is 29 which is a year younger than the meals on wheels requirement but Ubaldo Glassing will add his name to the list as the list is long at this time ?She states that at least his name will be on the list close to the time when he may meet requirement criteria and a SW will outreach to him at that time  ? ?Plan ?[facility] RN CM will follow up with patient within the next 30 +business days ? ?Camilo Mander L. [name], RN, BSN, CCM ?[place] Management Care Coordinator ?Office number [phone] ? ?

## 2021-08-12 ENCOUNTER — Telehealth: Payer: Self-pay

## 2021-08-12 NOTE — Telephone Encounter (Signed)
   Telephone encounter was:  Successful.  08/12/2021 Name: Danny Hopkins MRN: [id number] DOB: 05-Dec-1960  Danny Hopkins is a 61 y.o. year old male who is a primary care patient of Harlan Stains, MD . The community resource team was consulted for assistance with [place] guide performed the following interventions: Patient provided with information about care guide support team and interviewed to confirm resource needs.At patients request I will follow up with Meals On Wheels. He can not stand long enough to cook  Follow Up Plan:  Care guide will follow up with patient by phone over the next day    [place], Care Management  [phone] [address], [place], [place] [address] Phone: [phone] Email: [email or web address]

## 2021-08-17 ENCOUNTER — Telehealth: Payer: Self-pay

## 2021-08-17 NOTE — Telephone Encounter (Signed)
   Telephone encounter was:  Unsuccessful.  08/17/2021 Name: Danny Hopkins MRN: [id number] DOB: 03/02/1961  Unsuccessful outbound call made today to assist with:   meals  Outreach Attempt:  2nd Attempt follow up call  A HIPAA compliant voice message was left requesting a return call.  Instructed patient to call back at earliest convenience.     [place], Care Management  [phone] [address], [place], [place] [address] Phone: [phone] Email: [email or web address]

## 2021-08-24 ENCOUNTER — Telehealth: Payer: Self-pay

## 2021-08-24 NOTE — Telephone Encounter (Signed)
   Telephone encounter was:  Unsuccessful.  08/24/2021 Name: Danny Hopkins MRN: [id number] DOB: Jun 26, 1960  Unsuccessful outbound call made today to assist with:   meals  Outreach Attempt:  3rd Attempt.  Referral closed unable to contact patient.  A HIPAA compliant voice message was left requesting a return call.  Instructed patient to call back. Follow up call. Left a message to call back at earliest convenience.  [place], Care Management  [phone] [address], [place], [place] [address] Phone: [phone] Email: [email or web address]

## 2021-08-31 ENCOUNTER — Other Ambulatory Visit: Payer: Self-pay | Admitting: *Deleted

## 2021-08-31 NOTE — Patient Outreach (Signed)
[facility]) Care Management Telephonic RN Care Manager Note   08/31/2021 Name:  Danny Hopkins MRN:  [id number] DOB:  09/18/1960  Summary: Completed return call to patient to include him updating RN CM on the progression of his personal care services and discussion of recent falls He reports his primary MD want him to go to PT to assist with obtaining a walker He reports he plans to go to [facility] care services progression  He confirms an outreach from [facility] staff last evening and he had a call this morning  "It would not be long" for services to start as he reports his paperwork is complete  Recommendations/Changes made from today's visit: Returned call to patient after a message received  Encouragement provided  Reviewed upcoming RN CM outreach  RN CM notes with review of EPIC that [facility] care guide had 3 unsuccessful outreaches to patient (food insecurity) and case closure occurred on 08/24/21   Subjective: Danny Hopkins is an 61 y.o. year old male who is a primary patient of Harlan Stains, MD. The care management team was consulted for assistance with care management and/or care coordination needs.    Telephonic RN Care Manager completed Telephone Visit today.   Objective:  Medications Reviewed Today     Reviewed by Collier Salina, MD (Physician) on 07/20/21 at 1157  Med List Status: <None>   Medication Order Taking? Sig Documenting Provider Last Dose Status Informant  ALPRAZolam (XANAX) 1 MG tablet [id number] Yes TAKE 1 TABLET IN THE MORNING AND 1 AND 1/2 TABLETS IN THE EVENING AS NEEDED Biagio Borg, MD Taking Active   amLODipine (NORVASC) 5 MG tablet [id number]  Take 1 tablet (5 mg total) by mouth daily. Collier Salina, MD  Active   atorvastatin (LIPITOR) 20 MG tablet [id number] Yes TAKE 1 TABLET BY MOUTH EVERY DAY  Patient taking differently: Take 20 mg by mouth daily.   Biagio Borg, MD Taking Active   baclofen (LIORESAL) 10 MG  tablet [id number] Yes Take 10 mg by mouth daily as needed (Back spasms). [provider] Taking Active Self  Cholecalciferol (VITAMIN D3) 50 MCG (2000 UT) TABS [id number] Yes Take 2,000 Units by mouth daily. [provider] Taking Active Self  clotrimazole-betamethasone (LOTRISONE) cream [id number] No APPLY TO AFFECTED AREA TWICE A DAY  Patient not taking: Reported on 07/20/2021   Biagio Borg, MD Not Taking Active   docusate sodium (COLACE) 100 MG capsule [id number] Yes Take 200 mg by mouth 2 (two) times daily. [provider] Taking Active Self  fluticasone (FLONASE) 50 MCG/ACT nasal spray [id number] Yes Place 2 sprays into both nostrils daily. [provider] Taking Active Self  HYDROcodone-acetaminophen (NORCO) 10-325 MG per tablet [id number] No Take 1 tablet by mouth every 6 (six) hours.  Patient not taking: Reported on 07/20/2021   [provider] Not Taking Active Self  hydrocortisone (ANUSOL-HC) 2.5 % rectal cream [id number] No Apply 1 application topically 2 (two) times daily.  Patient not taking: Reported on 07/20/2021   [provider] Not Taking Active   MOVANTIK 25 MG TABS tablet [id number] Yes Take 1 tablet (25 mg total) by mouth every morning. Zehr, Laban Emperor, PA-C Taking Active   naproxen (NAPROSYN) 500 MG tablet [id number] No TAKE 1 TABLET (500 MG TOTAL) BY MOUTH 2 (TWO) TIMES DAILY AS NEEDED FOR HEADACHE.  Patient not taking: Reported on 07/20/2021   Biagio Borg, MD Not Taking Active Self  omeprazole (  PRILOSEC) 20 MG capsule [id number] Yes TAKE 1 CAPSULE BY MOUTH EVERY DAY AS NEEDED Biagio Borg, MD Taking Active   oxyCODONE-acetaminophen (PERCOCET) 7.5-325 MG tablet [id number] Yes take 1 tablet by oral route  every 6 hours as needed DNF (07/02/21) [provider] Taking Active   sertraline (ZOLOFT) 50 MG tablet [id number] Yes Take 50 mg by mouth daily. [provider] Taking Active   SUMAtriptan (IMITREX) 100 MG tablet  [id number] Yes Take 1 tablet (100 mg total) by mouth every 2 (two) hours as needed for migraine or headache. May repeat in 2 hours if headache persists or recurs. Biagio Borg, MD Taking Active Self  tamsulosin [facility]) 0.4 MG CAPS capsule [id number] Yes TAKE 1 CAPSULE BY MOUTH EVERY DAY  Patient taking differently: Take 0.4 mg by mouth daily.   Biagio Borg, MD Taking Active              SDOH:  (Social Determinants of Health) assessments and interventions performed:    Care Plan  Review of patient past medical history, allergies, medications, health status, including review of consultants reports, laboratory and other test data, was performed as part of comprehensive evaluation for care management services.   Care Plan : RN Care Manager Plan of Care  Updates made by Barbaraann Faster, RN since 08/31/2021 12:00 AM     Problem: Complex Care Coordination Needs and disease management in patient with chronic pain, stroke, mobility concerns   Priority: High     Long-Range Goal: Establish Plan of Care for Management Complex SDOH Barriers, disease management and Care Coordination Needs in patient with chronic pain, stroke, mobility concerns   Start Date: 02/10/2021  This Visit's Progress: On track  Recent Progress: On track  Priority: High  Note:   Current Barriers:  Knowledge Deficits related to plan of care for management of chronic pain, stroke, mobility concerns Care Coordination needs related to ADL IADL limitations, Limited education about chronic pain, stroke, mobility concerns*, and Lacks knowledge of community resource: chronic pain, stroke, mobility personal care services concerns Mobility, health behaviors Interest in personal care services -has completed medicaid application pending response, various call attempts to social services unsuccessfully 03/15/21 Understanding of DSS forms/letters/medicaid process  RN CM Clinical Goal(s):  Patient will verbalize understanding of  plan for management of chronic pain, stroke, mobility concerns work with [place]  to complete medicaid application  through collaboration with Consulting civil engineer, provider, and care team. - Goal met 03/10/21 confirms completion of medicaid application pending approval response 03/15/21 patient will verbalize understanding of DSS letters and be connected with DSS staff for further resources  Interventions: Follow up with patient as agree to re assess needs Inter-disciplinary care team collaboration (see longitudinal plan of care) Evaluation of current treatment plan related to  self management and patient's adherence to plan as established by provider 03/10/21 Assist with conference and non conference outreach to [place] for medicaid application status.  RN CM left a message for Abbi Gill at [phone] listed as th medicaid review manager Requesting the status of pt medicaid application and possible assist with personal care services Discussed extend wait time with calling main DSS line and left pt and RN CM numbers  Used teach back method to clarify that he understood the second recent letter he received. This letter stated he has full medicaid and his recent medicaid application was closed. RN CM and patient completed a conference call to leave a message for  Mrs [name] (reported by Mrs Rosita Fire to be patient's assigned DSS staff) for more clarity,  questions and assist with personal care services.  Explained to him the importance of him asking Mrs [name] about his specific medicaid and if it covers personal care services if she calls him He voices understanding 03/19/21 another message left for DSS staff, [place], Follow up with patient, updated him 04/09/21 care coordination with pcp referral staff [place] about [facility], PCS , updated patient, encourage him to continue outreaches/interventions with DSS/CAPs staff Educated him again he has to have full medicaid to get PCS, or pay out of pocket  for PCS but has option of [employer] covered PT/OT limited sessions 04/26/21 provided DSS SW number ([place]) [phone] Reviewed PCS cost without medicaid/out of pocket expense 05/11/21 return call to pt, calls to DSS, pcp, pt- Provided DSS [place] number to pt,questions answered about [place] benefits, collaborated with pcp staff- [facility], pcp assisting to connect pt to CAP program 05/25/21 unsuccessful outreach 05/26/21 Assessed for worsening symptoms and attempt to get clarity of why he rates his health as fair Reviewed with him that RN CM had reviewed EPIC 05/17/21 pcp visit notes indicating he fell/no injury/trouble walking/possible infected finger/3 right hand fingers discolored, HH services, PCS referral Reviewed types of  home health services to include [facility] RN, PT, OT, aide, SW- inquired about SW services Inquired about his follow up on [place] benefits for possible personal care services assistance  Discussed RN CM's previous outreach to [employer] staff to voice his concerns of worsening medical symptoms Encouraged him to clarify his preference to his MDs  Pain Interventions: on track  long term goal 08/04/21 same Pain assessment performed Medications reviewed Reviewed provider established plan for pain management Discussed importance of adherence to all scheduled medical appointments Counseled on the importance of reporting any/all new or changed pain symptoms or management strategies to pain management provider Advised patient to report to care team affect of pain on daily activities Discussed use of relaxation techniques and/or diversional activities to assist with pain reduction (distraction, imagery, relaxation, massage, acupressure, TENS, heat, and cold application Reviewed with patient prescribed pharmacological and nonpharmacological pain relief strategies Screening for signs and symptoms of depression related to chronic disease state  Assessed social determinant of health barriers 06/29/21 Encourage not to  wait so long for injection/therapy reviewed quarterly/bi yearly pain management injections Discussed home health therapy yearly options with assist from pcp Discuss cold weather related to pain Depression screen Discussed [facility] [place] -e-mailed patient resources  to encourage him to remain active/socialization/possible volunteer home assistance or telephonic [employer] e-mail with resource information   Stroke with mobility concernsGoal on track:  Yes. 06/29/21 home therapies with [facility] concluded Evaluation of current treatment plan related to  stroke, mobility concerns , ADL IADL limitations, Limited education about stroke, mobility concerns*, and Lacks knowledge of community resource: medicaid  self-management and patient's adherence to plan as established by provider. Discussed plans with patient for ongoing care management follow up and provided patient with direct contact information for care management team Provided education to patient re: stroke  Collaborated with DSS regarding medicaid application Provided patient and/or caregiver with home health information about [facility] Passenger transport manager) (Data processing manager) Discussed plans with patient for ongoing care management follow up and provided patient with direct contact information for care management team Assessed social determinant of health barriers Recommendations/Changes made from today's visit:  RN CM updated him of the agency [facility] (previously  [facility])  Questions answered for him He was encouraged to give the agency an opportunity to work with him and to continue to see his pain management providers  RN CM inquired of an update on his outreaches with DSS   Interdisciplinary Collaboration Interventions:  (Status: Goal on track:  Yes.) Short Term Goal   08/31/21 He confirms an outreach from [facility] staff last evening and he had a call this morning  "It would not be long" for services to start as he reports his  paperwork is complete RN CM notes with review of EPIC that [facility] care guide had 3 unsuccessful outreaches to patient (food insecurity) and case closure occurred on 08/24/21  Collaborated with RN CM to initiate plan of care to address needs related to Limited access to food and Lacks knowledge of community resource: food insecurity  in patient with  stroke, raynaud's disease, chronic lumbar spine degenerative arthritis with radiculopathy Collaboration with [facility] care guide - RN CM referred on 08/06/21  Patient Goals/Self-Care Activities: Patient will self administer medications as prescribed Patient will attend all scheduled provider appointments Patient will call pharmacy for medication refills Patient will attend church or other social activities Patient will continue to perform ADL's independently Patient will continue to perform IADL's independently Patient will call provider office for new concerns or questions  Follow Up Plan:  The patient has been provided with contact information for the care management team and has been advised to call with any health related questions or concerns.  The care management team will reach out to the patient again over the next 30+ business  days.       Plan: The patient has been provided with contact information for the care management team and has been advised to call with any health related questions or concerns.  The care management team will reach out to the patient again over the next 30+ business days.  Burdett Pinzon L. [name], RN, BSN, [place] Coordinator Office number [phone] [facility] number [phone] Fax number [phone]

## 2021-09-06 ENCOUNTER — Other Ambulatory Visit: Payer: Self-pay | Admitting: *Deleted

## 2021-09-06 NOTE — Patient Outreach (Signed)
[facility]) Care Management  09/06/2021  Danny Hopkins 1960/07/19 [id number]   [facility] Unsuccessful outreach Outreach attempt to the listed at the preferred outreach number in [place] No answer. [facility] RN CM left HIPAA [facility] Portability and Accountability Act) compliant voicemail message along with CM's contact info.      Plan: [facility] RN CM scheduled this patient for another call attempt within 4-7 business days Unsuccessful outreach on 09/06/21   Holly Iannaccone L. [name], RN, BSN, [place] Coordinator Office number [phone]

## 2021-09-07 ENCOUNTER — Other Ambulatory Visit: Payer: Self-pay | Admitting: *Deleted

## 2021-09-07 NOTE — Patient Outreach (Signed)
[facility]) Care Management Telephonic RN Care Manager Note   09/07/21 Name:  Danny Hopkins MRN:  [id number] DOB:  1960/07/31  Summary: Return call from patient with follow up and case closure- external vendor services Mr Sundt returned a call to RN CM after a voice message was left on 09/06/21 He confirms as approved for [employer] Alternatives Programs (CAP)  Monday, Wednesday, Friday 10-2 pm for now  Pending a call from staff for further information  To start PT on Thursday September 09 2021  He reports he has inquired about assistance in obtaining a rollator at his pcp   He still has episodes in which his legs stiffen and ge   Recommendations/Changes made from today's visit: Follow up for updated on CAP services  RN CM updated him on his case closure as he has external vendor CRN services and now  Discussed economic/discount stores for possible purchase   Case closure  Subjective: BRADFORD CAZIER is an 61 y.o. year old male who is a primary patient of Harlan Stains, MD. The care management team was consulted for assistance with care management and/or care coordination needs.    Telephonic RN Care Manager completed Telephone Visit today.   Objective:  Medications Reviewed Today     Reviewed by Barbaraann Faster, RN (Registered Nurse) on 09/07/21 at [place] List Status: <None>   Medication Order Taking? Sig Documenting Provider Last Dose Status Informant  ALPRAZolam (XANAX) 1 MG tablet [id number] No TAKE 1 TABLET IN THE MORNING AND 1 AND 1/2 TABLETS IN THE EVENING AS NEEDED Biagio Borg, MD Taking Active   amLODipine (NORVASC) 5 MG tablet [id number]  TAKE 1 TABLET (5 MG TOTAL) BY MOUTH DAILY. Collier Salina, MD  Active   atorvastatin (LIPITOR) 20 MG tablet [id number] No TAKE 1 TABLET BY MOUTH EVERY DAY  Patient taking differently: Take 20 mg by mouth daily.   Biagio Borg, MD Taking Active   baclofen (LIORESAL) 10 MG tablet [id number] No Take 10  mg by mouth daily as needed (Back spasms). [provider] Taking Active Self  Cholecalciferol (VITAMIN D3) 50 MCG (2000 UT) TABS [id number] No Take 2,000 Units by mouth daily. [provider] Taking Active Self  clotrimazole-betamethasone (LOTRISONE) cream [id number] No APPLY TO AFFECTED AREA TWICE A DAY  Patient not taking: Reported on 07/20/2021   Biagio Borg, MD Not Taking Active   docusate sodium (COLACE) 100 MG capsule [id number] No Take 200 mg by mouth 2 (two) times daily. [provider] Taking Active Self  fluticasone (FLONASE) 50 MCG/ACT nasal spray [id number] No Place 2 sprays into both nostrils daily. [provider] Taking Active Self  HYDROcodone-acetaminophen (NORCO) 10-325 MG per tablet [id number] No Take 1 tablet by mouth every 6 (six) hours.  Patient not taking: Reported on 07/20/2021   [provider] Not Taking Active Self  hydrocortisone (ANUSOL-HC) 2.5 % rectal cream [id number] No Apply 1 application topically 2 (two) times daily.  Patient not taking: Reported on 07/20/2021   [provider] Not Taking Active   MOVANTIK 25 MG TABS tablet [id number] No Take 1 tablet (25 mg total) by mouth every morning. Zehr, Laban Emperor, PA-C Taking Active   naproxen (NAPROSYN) 500 MG tablet [id number] No TAKE 1 TABLET (500 MG TOTAL) BY MOUTH 2 (TWO) TIMES DAILY AS NEEDED FOR HEADACHE.  Patient not taking: Reported on 07/20/2021   Biagio Borg, MD Not Taking Active Self  omeprazole (PRILOSEC) 20 MG  capsule [id number] No TAKE 1 CAPSULE BY MOUTH EVERY DAY AS NEEDED Biagio Borg, MD Taking Active   oxyCODONE-acetaminophen (PERCOCET) 7.5-325 MG tablet [id number] No take 1 tablet by oral route  every 6 hours as needed DNF (07/02/21) [provider] Taking Active   sertraline (ZOLOFT) 50 MG tablet [id number] No Take 50 mg by mouth daily. [provider] Taking Active   SUMAtriptan (IMITREX) 100 MG tablet [id number] No Take 1 tablet (100 mg  total) by mouth every 2 (two) hours as needed for migraine or headache. May repeat in 2 hours if headache persists or recurs. Biagio Borg, MD Taking Active Self  tamsulosin [facility]) 0.4 MG CAPS capsule [id number] No TAKE 1 CAPSULE BY MOUTH EVERY DAY  Patient taking differently: Take 0.4 mg by mouth daily.   Biagio Borg, MD Taking Active              SDOH:  (Social Determinants of Health) assessments and interventions performed:    Care Plan  Review of patient past medical history, allergies, medications, health status, including review of consultants reports, laboratory and other test data, was performed as part of comprehensive evaluation for care management services.   Care Plan : RN Care Manager Plan of Care  Updates made by Barbaraann Faster, RN since 09/30/2021 12:00 AM  Completed 09/30/2021   Problem: Complex Care Coordination Needs and disease management in patient with chronic pain, stroke, mobility concerns Resolved 09/07/2021  Priority: High     Long-Range Goal: Establish Plan of Care for Management Complex SDOH Barriers, disease management and Care Coordination Needs in patient with chronic pain, stroke, mobility concerns Completed 09/07/2021  Start Date: 02/10/2021  This Visit's Progress: On track  Recent Progress: On track  Priority: High  Note:   Current Barriers:  Knowledge Deficits related to plan of care for management of chronic pain, stroke, mobility concerns Care Coordination needs related to ADL IADL limitations, Limited education about chronic pain, stroke, mobility concerns*, and Lacks knowledge of community resource: chronic pain, stroke, mobility personal care services concerns Mobility, health behaviors Interest in personal care services -has completed medicaid application pending response, various call attempts to social services unsuccessfully 03/15/21 Understanding of DSS forms/letters/medicaid process  RN CM Clinical Goal(s):  Patient will verbalize  understanding of plan for management of chronic pain, stroke, mobility concerns work with [place]  to complete medicaid application  through collaboration with Consulting civil engineer, provider, and care team. - Goal met 03/10/21 confirms completion of medicaid application pending approval response 03/15/21 patient will verbalize understanding of DSS letters and be connected with DSS staff for further resources  Interventions: Follow up with patient as agree to re assess needs Inter-disciplinary care team collaboration (see longitudinal plan of care) Evaluation of current treatment plan related to  self management and patient's adherence to plan as established by provider 03/10/21 Assist with conference and non conference outreach to [place] for medicaid application status.  RN CM left a message for Abbi Gill at [phone] listed as th medicaid review manager Requesting the status of pt medicaid application and possible assist with personal care services Discussed extend wait time with calling main DSS line and left pt and RN CM numbers  Used teach back method to clarify that he understood the second recent letter he received. This letter stated he has full medicaid and his recent medicaid application was closed. RN CM and patient completed a conference call to leave a message for  Mrs [name] (reported by Mrs Rosita Fire to be patient's assigned DSS staff) for more clarity,  questions and assist with personal care services.  Explained to him the importance of him asking Mrs [name] about his specific medicaid and if it covers personal care services if she calls him He voices understanding 03/19/21 another message left for DSS staff, [place], Follow up with patient, updated him 04/09/21 care coordination with pcp referral staff En[place] D[place] about [facility], PCS , updated patient, encourage him to continue outreaches/interventions with DSS/CAPs staff Educated him again he has to have full medicaid to get PCS, or  pay out of pocket for PCS but has option of UHC covered PT/OT limited sessions 04/26/21 provided DSS SW number ([place]) [phone] Reviewed PCS cost without medicaid/out of pocket expense 05/11/21 return call to pt, calls to DSS, pcp, pt- Provided DSS [place] number to pt,questions answered about [place] benefits, collaborated with pcp staff- [facility], pcp assisting to connect pt to CAP program 05/25/21 unsuccessful outreach 05/26/21 Assessed for worsening symptoms and attempt to get clarity of why he rates his health as fair Reviewed with him that RN CM had reviewed EPIC 05/17/21 pcp visit notes indicating he fell/no injury/trouble walking/possible infected finger/3 right hand fingers discolored, HH services, PCS referral Reviewed types of  home health services to include [facility] RN, PT, OT, aide, SW- inquired about SW services Inquired about his follow up on [place] benefits for possible personal care services assistance  Discussed RN CM's previous outreach to [employer] staff to voice his concerns of worsening medical symptoms Encouraged him to clarify his preference to his MDs 09/07/21 case closure warm transfer to external vendor (pcp)  Pain Interventions: on track  long term goal  09/07/21  managing pain fairly  warm transfer to external vendor 08/04/21 same Pain assessment performed Medications reviewed Reviewed provider established plan for pain management Discussed importance of adherence to all scheduled medical appointments Counseled on the importance of reporting any/all new or changed pain symptoms or management strategies to pain management provider Advised patient to report to care team affect of pain on daily activities Discussed use of relaxation techniques and/or diversional activities to assist with pain reduction (distraction, imagery, relaxation, massage, acupressure, TENS, heat, and cold application Reviewed with patient prescribed pharmacological and nonpharmacological pain relief  strategies Screening for signs and symptoms of depression related to chronic disease state  Assessed social determinant of health barriers 06/29/21 Encourage not to wait so long for injection/therapy reviewed quarterly/bi yearly pain management injections Discussed home health therapy yearly options with assist from pcp Discuss cold weather related to pain Depression screen Discussed [facility] [place] -e-mailed patient resources  to encourage him to remain active/socialization/possible volunteer home assistance or telephonic [employer] e-mail with resource information   Stroke with mobility concernsGoal on track:  Yes.  09/07/21 warm transfer to external vendor 06/29/21 home therapies with [facility] concluded Evaluation of current treatment plan related to  stroke, mobility concerns , ADL IADL limitations, Limited education about stroke, mobility concerns*, and Lacks knowledge of community resource: medicaid  self-management and patient's adherence to plan as established by provider. Discussed plans with patient for ongoing care management follow up and provided patient with direct contact information for care management team Provided education to patient re: stroke  Collaborated with DSS regarding medicaid application Provided patient and/or caregiver with home health information about [facility] Passenger transport manager) (Data processing manager) Discussed plans with patient for ongoing care management follow up and provided patient with direct  contact information for care management team Assessed social determinant of health barriers Recommendations/Changes made from today's visit:  RN CM updated him of the agency [facility] (previously [facility])  Questions answered for him He was encouraged to give the agency an opportunity to work with him and to continue to see his pain management providers  RN CM inquired of an update on his outreaches with DSS   Interdisciplinary Collaboration  Interventions:  (Status: Goal Met.) Short Term Goal   09/07/21 warm transfer to external vendor 08/31/21 He confirms an outreach from [place] staff last evening and he had a call this morning  "It would not be long" for services to start as he reports his paperwork is complete RN CM notes with review of EPIC that [facility] care guide had 3 unsuccessful outreaches to patient (food insecurity) and case closure occurred on 08/24/21  Collaborated with RN CM to initiate plan of care to address needs related to Limited access to food and Lacks knowledge of community resource: food insecurity  in patient with  stroke, raynaud's disease, chronic lumbar spine degenerative arthritis with radiculopathy Collaboration with [facility] care guide - RN CM referred on 08/06/21  Patient Goals/Self-Care Activities: Patient will self administer medications as prescribed Patient will attend all scheduled provider appointments Patient will call pharmacy for medication refills Patient will attend church or other social activities Patient will continue to perform ADL's independently Patient will continue to perform IADL's independently Patient will call provider office for new concerns or questions  Follow Up Plan:  The patient has been provided with contact information for the care management team and has been advised to call with any health related questions or concerns.  09/07/21 warm transfer to external vendor        Plan: The patient has been provided with contact information for the care management team and has been advised to call with any health related questions or concerns.  No further follow up required: case closure as patient has an external care management vendor  [place]. [name], RN, BSN, [place] Coordinator Office number [phone] [facility] number [phone] Fax number [phone]

## 2021-09-09 DIAGNOSIS — M4712 Other spondylosis with myelopathy, cervical region: Secondary | ICD-10-CM | POA: Diagnosis not present

## 2021-09-09 DIAGNOSIS — L03011 Cellulitis of right finger: Secondary | ICD-10-CM | POA: Diagnosis not present

## 2021-09-09 DIAGNOSIS — I69354 Hemiplegia and hemiparesis following cerebral infarction affecting left non-dominant side: Secondary | ICD-10-CM | POA: Diagnosis not present

## 2021-09-09 DIAGNOSIS — R293 Abnormal posture: Secondary | ICD-10-CM | POA: Diagnosis not present

## 2021-09-09 DIAGNOSIS — M6281 Muscle weakness (generalized): Secondary | ICD-10-CM | POA: Diagnosis not present

## 2021-09-09 DIAGNOSIS — M2569 Stiffness of other specified joint, not elsewhere classified: Secondary | ICD-10-CM | POA: Diagnosis not present

## 2021-09-10 ENCOUNTER — Ambulatory Visit: Payer: Self-pay | Admitting: *Deleted

## 2021-09-13 DIAGNOSIS — R293 Abnormal posture: Secondary | ICD-10-CM | POA: Diagnosis not present

## 2021-09-13 DIAGNOSIS — M6281 Muscle weakness (generalized): Secondary | ICD-10-CM | POA: Diagnosis not present

## 2021-09-13 DIAGNOSIS — M2569 Stiffness of other specified joint, not elsewhere classified: Secondary | ICD-10-CM | POA: Diagnosis not present

## 2021-09-13 DIAGNOSIS — I69354 Hemiplegia and hemiparesis following cerebral infarction affecting left non-dominant side: Secondary | ICD-10-CM | POA: Diagnosis not present

## 2021-09-15 DIAGNOSIS — M5416 Radiculopathy, lumbar region: Secondary | ICD-10-CM | POA: Diagnosis not present

## 2021-09-15 DIAGNOSIS — M4712 Other spondylosis with myelopathy, cervical region: Secondary | ICD-10-CM | POA: Diagnosis not present

## 2021-09-15 DIAGNOSIS — G894 Chronic pain syndrome: Secondary | ICD-10-CM | POA: Diagnosis not present

## 2021-09-17 DIAGNOSIS — M2569 Stiffness of other specified joint, not elsewhere classified: Secondary | ICD-10-CM | POA: Diagnosis not present

## 2021-09-17 DIAGNOSIS — M6281 Muscle weakness (generalized): Secondary | ICD-10-CM | POA: Diagnosis not present

## 2021-09-17 DIAGNOSIS — I69354 Hemiplegia and hemiparesis following cerebral infarction affecting left non-dominant side: Secondary | ICD-10-CM | POA: Diagnosis not present

## 2021-09-17 DIAGNOSIS — R293 Abnormal posture: Secondary | ICD-10-CM | POA: Diagnosis not present

## 2021-09-20 DIAGNOSIS — R293 Abnormal posture: Secondary | ICD-10-CM | POA: Diagnosis not present

## 2021-09-20 DIAGNOSIS — M6281 Muscle weakness (generalized): Secondary | ICD-10-CM | POA: Diagnosis not present

## 2021-09-20 DIAGNOSIS — M2569 Stiffness of other specified joint, not elsewhere classified: Secondary | ICD-10-CM | POA: Diagnosis not present

## 2021-09-20 DIAGNOSIS — I69354 Hemiplegia and hemiparesis following cerebral infarction affecting left non-dominant side: Secondary | ICD-10-CM | POA: Diagnosis not present

## 2021-09-24 DIAGNOSIS — M6281 Muscle weakness (generalized): Secondary | ICD-10-CM | POA: Diagnosis not present

## 2021-09-24 DIAGNOSIS — M2569 Stiffness of other specified joint, not elsewhere classified: Secondary | ICD-10-CM | POA: Diagnosis not present

## 2021-09-24 DIAGNOSIS — R293 Abnormal posture: Secondary | ICD-10-CM | POA: Diagnosis not present

## 2021-09-24 DIAGNOSIS — I69354 Hemiplegia and hemiparesis following cerebral infarction affecting left non-dominant side: Secondary | ICD-10-CM | POA: Diagnosis not present

## 2021-09-27 DIAGNOSIS — M6281 Muscle weakness (generalized): Secondary | ICD-10-CM | POA: Diagnosis not present

## 2021-09-27 DIAGNOSIS — R293 Abnormal posture: Secondary | ICD-10-CM | POA: Diagnosis not present

## 2021-09-27 DIAGNOSIS — M2569 Stiffness of other specified joint, not elsewhere classified: Secondary | ICD-10-CM | POA: Diagnosis not present

## 2021-09-27 DIAGNOSIS — I69354 Hemiplegia and hemiparesis following cerebral infarction affecting left non-dominant side: Secondary | ICD-10-CM | POA: Diagnosis not present

## 2021-09-29 DIAGNOSIS — I69354 Hemiplegia and hemiparesis following cerebral infarction affecting left non-dominant side: Secondary | ICD-10-CM | POA: Diagnosis not present

## 2021-09-29 DIAGNOSIS — L03011 Cellulitis of right finger: Secondary | ICD-10-CM | POA: Diagnosis not present

## 2021-10-01 DIAGNOSIS — R293 Abnormal posture: Secondary | ICD-10-CM | POA: Diagnosis not present

## 2021-10-01 DIAGNOSIS — I69354 Hemiplegia and hemiparesis following cerebral infarction affecting left non-dominant side: Secondary | ICD-10-CM | POA: Diagnosis not present

## 2021-10-01 DIAGNOSIS — M2569 Stiffness of other specified joint, not elsewhere classified: Secondary | ICD-10-CM | POA: Diagnosis not present

## 2021-10-01 DIAGNOSIS — M6281 Muscle weakness (generalized): Secondary | ICD-10-CM | POA: Diagnosis not present

## 2021-10-04 DIAGNOSIS — M5416 Radiculopathy, lumbar region: Secondary | ICD-10-CM | POA: Diagnosis not present

## 2021-10-06 DIAGNOSIS — M2569 Stiffness of other specified joint, not elsewhere classified: Secondary | ICD-10-CM | POA: Diagnosis not present

## 2021-10-06 DIAGNOSIS — R293 Abnormal posture: Secondary | ICD-10-CM | POA: Diagnosis not present

## 2021-10-06 DIAGNOSIS — M6281 Muscle weakness (generalized): Secondary | ICD-10-CM | POA: Diagnosis not present

## 2021-10-06 DIAGNOSIS — I69354 Hemiplegia and hemiparesis following cerebral infarction affecting left non-dominant side: Secondary | ICD-10-CM | POA: Diagnosis not present

## 2021-10-08 DIAGNOSIS — I69354 Hemiplegia and hemiparesis following cerebral infarction affecting left non-dominant side: Secondary | ICD-10-CM | POA: Diagnosis not present

## 2021-10-08 DIAGNOSIS — R293 Abnormal posture: Secondary | ICD-10-CM | POA: Diagnosis not present

## 2021-10-08 DIAGNOSIS — M2569 Stiffness of other specified joint, not elsewhere classified: Secondary | ICD-10-CM | POA: Diagnosis not present

## 2021-10-08 DIAGNOSIS — M6281 Muscle weakness (generalized): Secondary | ICD-10-CM | POA: Diagnosis not present

## 2021-10-12 DIAGNOSIS — L03011 Cellulitis of right finger: Secondary | ICD-10-CM | POA: Diagnosis not present

## 2021-10-13 DIAGNOSIS — M2569 Stiffness of other specified joint, not elsewhere classified: Secondary | ICD-10-CM | POA: Diagnosis not present

## 2021-10-13 DIAGNOSIS — I69354 Hemiplegia and hemiparesis following cerebral infarction affecting left non-dominant side: Secondary | ICD-10-CM | POA: Diagnosis not present

## 2021-10-13 DIAGNOSIS — R293 Abnormal posture: Secondary | ICD-10-CM | POA: Diagnosis not present

## 2021-10-13 DIAGNOSIS — M6281 Muscle weakness (generalized): Secondary | ICD-10-CM | POA: Diagnosis not present

## 2021-10-19 DIAGNOSIS — I69354 Hemiplegia and hemiparesis following cerebral infarction affecting left non-dominant side: Secondary | ICD-10-CM | POA: Diagnosis not present

## 2021-10-19 DIAGNOSIS — M6281 Muscle weakness (generalized): Secondary | ICD-10-CM | POA: Diagnosis not present

## 2021-10-19 DIAGNOSIS — R293 Abnormal posture: Secondary | ICD-10-CM | POA: Diagnosis not present

## 2021-10-19 DIAGNOSIS — M2569 Stiffness of other specified joint, not elsewhere classified: Secondary | ICD-10-CM | POA: Diagnosis not present

## 2021-10-22 DIAGNOSIS — R293 Abnormal posture: Secondary | ICD-10-CM | POA: Diagnosis not present

## 2021-10-22 DIAGNOSIS — I69354 Hemiplegia and hemiparesis following cerebral infarction affecting left non-dominant side: Secondary | ICD-10-CM | POA: Diagnosis not present

## 2021-10-22 DIAGNOSIS — M6281 Muscle weakness (generalized): Secondary | ICD-10-CM | POA: Diagnosis not present

## 2021-10-22 DIAGNOSIS — M2569 Stiffness of other specified joint, not elsewhere classified: Secondary | ICD-10-CM | POA: Diagnosis not present

## 2021-10-27 DIAGNOSIS — I69354 Hemiplegia and hemiparesis following cerebral infarction affecting left non-dominant side: Secondary | ICD-10-CM | POA: Diagnosis not present

## 2021-10-27 DIAGNOSIS — R293 Abnormal posture: Secondary | ICD-10-CM | POA: Diagnosis not present

## 2021-10-27 DIAGNOSIS — M6281 Muscle weakness (generalized): Secondary | ICD-10-CM | POA: Diagnosis not present

## 2021-10-27 DIAGNOSIS — M2569 Stiffness of other specified joint, not elsewhere classified: Secondary | ICD-10-CM | POA: Diagnosis not present

## 2021-10-29 DIAGNOSIS — I69354 Hemiplegia and hemiparesis following cerebral infarction affecting left non-dominant side: Secondary | ICD-10-CM | POA: Diagnosis not present

## 2021-10-29 DIAGNOSIS — M2569 Stiffness of other specified joint, not elsewhere classified: Secondary | ICD-10-CM | POA: Diagnosis not present

## 2021-10-29 DIAGNOSIS — M6281 Muscle weakness (generalized): Secondary | ICD-10-CM | POA: Diagnosis not present

## 2021-10-29 DIAGNOSIS — R293 Abnormal posture: Secondary | ICD-10-CM | POA: Diagnosis not present

## 2021-11-03 DIAGNOSIS — M6281 Muscle weakness (generalized): Secondary | ICD-10-CM | POA: Diagnosis not present

## 2021-11-03 DIAGNOSIS — R293 Abnormal posture: Secondary | ICD-10-CM | POA: Diagnosis not present

## 2021-11-03 DIAGNOSIS — I69354 Hemiplegia and hemiparesis following cerebral infarction affecting left non-dominant side: Secondary | ICD-10-CM | POA: Diagnosis not present

## 2021-11-03 DIAGNOSIS — M2569 Stiffness of other specified joint, not elsewhere classified: Secondary | ICD-10-CM | POA: Diagnosis not present

## 2021-12-13 ENCOUNTER — Ambulatory Visit: Payer: Medicare Other | Admitting: Family Medicine

## 2021-12-13 DIAGNOSIS — G894 Chronic pain syndrome: Secondary | ICD-10-CM | POA: Diagnosis not present

## 2021-12-14 ENCOUNTER — Ambulatory Visit (INDEPENDENT_AMBULATORY_CARE_PROVIDER_SITE_OTHER): Payer: Medicare Other | Admitting: Family Medicine

## 2021-12-14 ENCOUNTER — Ambulatory Visit: Payer: Self-pay

## 2021-12-14 VITALS — BP 94/66 | HR 61 | Ht 67.0 in | Wt 142.0 lb

## 2021-12-14 DIAGNOSIS — G8929 Other chronic pain: Secondary | ICD-10-CM | POA: Diagnosis not present

## 2021-12-14 DIAGNOSIS — M25512 Pain in left shoulder: Secondary | ICD-10-CM

## 2021-12-14 NOTE — Patient Instructions (Addendum)
Thank you for coming in today.   You received an injection today. Seek immediate medical attention if the joint becomes red, extremely painful, or is oozing fluid.   Continue to work on the exercises at home.  Check back as needed

## 2021-12-14 NOTE — Progress Notes (Signed)
   I, Peterson Lombard, LAT, ATC acting as a scribe for Lynne Leader, MD.  Danny Hopkins is a 61 y.o. male who presents to [place] at [facility] today for cont'd chronic L shoulder pain. Pt was last seen by Dr. Georgina Snell on 11/27/20 and was given a L subacromial steroid injection and was advised to cont HEP and use Voltaren gel. Today, pt reports L shoulder pain has returned over the last 3-4 weeks. Pt reports the R shoulder is also starting to be painful.   Dx imaging: 04/22/20 L shoulder XR  Pertinent review of systems: No fevers or chills  Relevant historical information: History of stroke and hemiplegia   Exam:  BP 94/66   Pulse 61   Ht '5\' 7"'$  (1.702 m)   Wt 142 lb (64.4 kg)   SpO2 99%   BMI 22.24 kg/m  General: Well Developed, well nourished, and in no acute distress.   MSK: Left shoulder normal-appearing normal motion pain with abduction.  Strength 4/5 abduction and external/internal rotation. Positive Hawkins and Neer's test.  Negative Yergason's and speeds test.    Lab and Radiology Results  Procedure: Real-time Ultrasound Guided Injection of left shoulder subacromial bursa Device: Philips Affiniti 50G Images permanently stored and available for review in PACS Verbal informed consent obtained.  Discussed risks and benefits of procedure. Warned about infection, bleeding, hyperglycemia damage to structures among others. Patient expresses understanding and agreement Time-out conducted.   Noted no overlying erythema, induration, or other signs of local infection.   Skin prepped in a sterile fashion.   Local anesthesia: Topical Ethyl chloride.   With sterile technique and under real time ultrasound guidance: 40 mg of Kenalog and 2 mm of Marcaine injected into subacromial bursa. Fluid seen entering the bursa.   Completed without difficulty   Pain immediately resolved suggesting accurate placement of the medication.   Advised to call if fevers/chills,  erythema, induration, drainage, or persistent bleeding.   Images permanently stored and available for review in the ultrasound unit.  Impression: Technically successful ultrasound guided injection.         Assessment and Plan: 61 y.o. male with left shoulder pain thought to be due to recurrent subacromial bursitis and impingement.  He is done very well with history of subacromial injection.  Last injection was 1 year ago.  Repeat injection today continue home exercise program check back as needed.   PDMP not reviewed this encounter. Orders Placed This Encounter  Procedures   [place] LIMITED JOINT SPACE STRUCTURES UP LEFT(NO LINKED CHARGES)    Order Specific Question:   Reason for Exam (SYMPTOM  OR DIAGNOSIS REQUIRED)    Answer:   left shoulder pain    Order Specific Question:   Preferred imaging location?    Answer:   [facility]   No orders of the defined types were placed in this encounter.    Discussed warning signs or symptoms. Please see discharge instructions. Patient expresses understanding.   The above documentation has been reviewed and is accurate and complete Lynne Leader, M.D.

## 2021-12-21 ENCOUNTER — Telehealth: Payer: Self-pay | Admitting: *Deleted

## 2021-12-21 NOTE — Patient Outreach (Signed)
  Care Coordination   Incoming care coordination  Visit Note   12/21/2021 Name: Danny Hopkins MRN: [id number] DOB: 1961/02/04  Danny Hopkins is a 61 y.o. year old male who sees Danny Stains, MD for primary care. I spoke with  Danny Hopkins by phone today when he called to request RN CM speak with the nurse in his home.   What matters to the patients health and wellness today?  In home services Polly, RN from [facility] is visiting Danny Hopkins.  He spoke with her about home services.  On 09/07/21 case closure pt was pending home health service visit Polly inquired about the DSS case worker Danny Hopkins She was provided with the number [phone] for the DSS case worker & [phone] for the main line at [place]. Answered questions about DSS, Community Alternatives Programs (CAP) Confirmed with Danny Hopkins and Danny Hopkins that Danny Hopkins care management services are with external vendor [facility] as he is a patient at Dr Danny Hopkins office and not a [facility] care management patient at this time. Confirmed patient not on [facility] segmented list    Goals Addressed   None     SDOH assessments and interventions completed:  No     Care Coordination Interventions Activated:  No  Care Coordination Interventions:  Yes, provided   Follow up plan: No further intervention required.   Encounter Outcome:  Pt. Visit Completed   Danny Bocchino L. [name], RN, BSN, [place] Coordinator Office number [phone]

## 2022-01-10 DIAGNOSIS — M5416 Radiculopathy, lumbar region: Secondary | ICD-10-CM | POA: Diagnosis not present

## 2022-01-24 ENCOUNTER — Other Ambulatory Visit: Payer: Self-pay | Admitting: Internal Medicine

## 2022-01-24 DIAGNOSIS — I7301 Raynaud's syndrome with gangrene: Secondary | ICD-10-CM

## 2022-01-24 DIAGNOSIS — I776 Arteritis, unspecified: Secondary | ICD-10-CM

## 2022-01-24 NOTE — Telephone Encounter (Signed)
Next Visit: 02/14/2022  Last Visit: 07/20/2021  Last Fill: amLODipine (NORVASC) 5 MG tablet  Dx: Raynaud's disease with gangrene  Current Dose per office note on 07/20/2021:   Okay to refill amlodipine?

## 2022-01-26 DIAGNOSIS — I776 Arteritis, unspecified: Secondary | ICD-10-CM | POA: Diagnosis not present

## 2022-01-26 DIAGNOSIS — R252 Cramp and spasm: Secondary | ICD-10-CM | POA: Diagnosis not present

## 2022-01-26 DIAGNOSIS — I69354 Hemiplegia and hemiparesis following cerebral infarction affecting left non-dominant side: Secondary | ICD-10-CM | POA: Diagnosis not present

## 2022-02-01 NOTE — Progress Notes (Signed)
Office Visit Note  Patient: Danny Hopkins             Date of Birth: [birth year]           MRN: [id number]             PCP: Harlan Stains, MD Referring: Harlan Stains, MD Visit Date: 02/14/2022   Subjective:   History of Present Illness: Danny Hopkins is a 61 y.o. male here for follow up follow up for raynaud's/peripheral vascular insufficiency on amlodipine 5 mg daily. Since our last visit he has done overall well. Seeing increased discoloration in right hand 3rd-5th fingers again since temperatures cooling off. Often feels some tingling and mild discomfort when happening. No new lesions or ulcers, 3rd finger nail is growing abnormally. More recently also started to see changes in distal left 4th and 5th fingers.  Previous HPI 07/20/2021 Danny Hopkins is a 61 y.o. male here for follow up for raynaud's/peripheral vascular insufficiency after increasing amlodipine dose to 10 mg daily. He has some improvement in symptoms no new lesions although he still sees blue discoloration in fingers intermittently. He has noticed several episodes of lightheadedness when standing and 3 times had to lie back down and wait to recover his symptoms.   Previous HPI 02/15/21 Danny Hopkins is a 61 y.o. male here for follow up for peripheral vascular insufficiency with ulceration of the right 4th finger and was started on amlodipine 5 mg daily after negative workup of systemic vasculitis antibodies. Since our last visit fingers are doing overall better he is noticing increase in blue discoloration of 5th fingers on both hands with the colder weather. His right 3rd finger has developed a few spots with redness in the finger and nail. His follow up in primary care clinic has shown some borderline low BP on the amlodipine.   Previous HPI: 11/02/20 Danny Hopkins is a 61 y.o. male here for evaluation for vasculitis with digital ulcer of right 4th finger. He has felt in overall usual health but  had bene noticing cold and numbness changes in his fingertips for months affecting the right 3-5th fingers. He developed a wound on the tip of his 4th finger in June he does not remember a particular injury or maybe a small abrasion starting this. He did not think it was severe but subsequently developed black discoloration of his whole fingertip peeling of the skin. He saw Dr. Burney Gauze for evaluation and subsequent upper extremity arteriography with findings of no patent palmar arch and diminished digital artery perfusions with normal proximal vasculature up to the wrist. Otherwise he denies feeling other significant symptom or medical changes lately. No skin rashes, lesions, no discoloration or ulceration of other digits or in the past. He has chronic lumbar spine degenerative arthritis with radiculopathy. He has chronic left sided residual weakness due to previous stroke. Workup for the stroke reportedly without any specific secondary underlying cause identified.   Labs reviewed 09/2020 CBC wnl BMP eGFR 48 UA contaminated, +Ca Oxalate, +glucose   Imaging reviewed 10/09/20 RUE arteriogram IMPRESSION:  Status post ultrasound guided access right common femoral artery for right upper extremity angiogram.  Findings reveal no significant atherosclerotic changes, with no evidence to suggest atheroembolic phenomenon as the source of the fourth digit ulcer, as the arteries are of normal course caliber and contour to the wrist.  The angiographic findings of the right hand are most likely a manifestation of either Buerger's disease (thrombo angiitis obliterans) or other  connective tissue disorder/rheumatologic disorder. There was a mild improvement with administration of  nitroglycerin/vasodilators.   10/05/20 LE Vascular ultrasound IMPRESSION: 1. Normal bilateral resting ankle-brachial indices. 2. Markedly blunted digital waveforms bilaterally suggests small vessels disease.   Review of Systems   Constitutional:  Negative for fatigue.  HENT:  Positive for mouth dryness. Negative for mouth sores.   Eyes:  Negative for dryness.  Respiratory:  Negative for shortness of breath.   Cardiovascular:  Negative for chest pain and palpitations.  Gastrointestinal:  Negative for blood in stool, constipation and diarrhea.  Endocrine: Negative for increased urination.  Genitourinary:  Negative for involuntary urination.  Musculoskeletal:  Positive for muscle weakness. Negative for joint pain, joint pain, joint swelling, myalgias, morning stiffness, muscle tenderness and myalgias.  Skin:  Positive for color change. Negative for rash and sensitivity to sunlight.  Allergic/Immunologic: Negative for susceptible to infections.  Neurological:  Positive for dizziness. Negative for headaches.  Hematological:  Negative for swollen glands.  Psychiatric/Behavioral:  Negative for depressed mood and sleep disturbance. The patient is not nervous/anxious.     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 ([place]) 02/15/2021   Therapeutic opioid induced constipation 01/21/2021   Rectal bleeding 01/21/2021   Internal hemorrhoids 01/21/2021   Vasculitis ([place]) 11/02/2020   Lumbar radiculopathy 09/17/2020   Allergic rhinitis 08/23/2020   Acute dysfunction of right eustachian tube 08/23/2020   Dysphagia 06/10/2020   Sore in nose 05/21/2020   Weight loss 05/19/2020   Left arm pain 04/19/2020   Migraines 01/07/2020   Depression 08/23/2019   Spasticity 05/29/2019   Vitamin D deficiency 08/25/2018   Laceration of left hand 09/27/2017   Acute pharyngitis 07/14/2017   Chronic tonsillitis 07/12/2017   Chronic pain  syndrome 06/22/2017   Bradycardia 06/22/2017   Abnormal ECG 06/22/2017   Former smoker 06/21/2016   Scalp pain 07/30/2015   Elevated blood-pressure reading, without diagnosis of hypertension 06/24/2015   Dandruff 06/24/2015   Lymphadenitis 06/24/2015   Burning with urination 01/25/2015   Balanitis 01/25/2015   Sleeping difficulties 02/04/2014   Bladder neck obstruction 02/04/2014   Hyperlipidemia 02/04/2014   Anterior spinal artery compression syndrome 10/10/2013   Other malaise and fatigue 07/24/2013   Stroke ([place]) 07/24/2013   Encounter for well adult exam with abnormal findings 07/24/2013   Neurologic gait disorder 07/24/2013   Other intervertebral disc displacement, lumbar region 05/29/2013   Cervical spondylosis with myelopathy 01/30/2013   Insomnia 10/12/2012   Generalized anxiety disorder 10/12/2012   Eczema 10/12/2012   History of stroke 10/12/2012   Corn or callus 07/07/2010   ED (erectile dysfunction) of organic origin 07/07/2010   Neurogenic bladder 06/10/2010   Unspecified urinary incontinence 06/10/2010   Hemiplegia ([place]) 06/09/2010    Past Medical History:  Diagnosis Date   Anxiety    07/2019   Arthritis    Depression 08/23/2019   History of chicken pox    Hyperlipidemia    2015   Neurologic gait disorder 07/24/2013   Chronic post stroke   Stroke [facility])    2006    Family History  Problem Relation Age of Onset   Healthy Sister    Healthy Sister    Healthy Brother    Healthy  Brother    Stomach cancer Neg Hx    Rectal cancer Neg Hx    Pancreatic cancer Neg Hx    Colon cancer Neg Hx    Colon polyps Neg Hx    Esophageal cancer Neg Hx    Past Surgical History:  Procedure Laterality Date   cervical staph infection     2006   COLONOSCOPY     2015   INGUINAL HERNIA REPAIR     kindergarten   IR ANGIOGRAM EXTREMITY RIGHT  10/09/2020   IR INTRAVASCULAR ULTRASOUND NON CORONARY  10/13/2020   IR RADIOLOGIST EVAL & MGMT  09/24/2020   IR RADIOLOGIST EVAL &  MGMT  10/27/2020   IR US GUIDE VASC ACCESS RIGHT  10/09/2020   Social History   Social History Narrative   Regular exercise-no   Caffeine Use-no   Right handed 12/25/20 report he is not able to write his name well at all now   Immunization History  Administered Date(s) Administered   Influenza Inj Mdck Quad Pf 12/09/2017   Influenza Split 01/05/2021   Influenza,inj,Quad PF,6+ Mos 02/04/2014, 01/20/2015, 12/23/2015, 12/13/2016, 12/10/2018, 12/26/2020   Influenza-Unspecified 03/29/2011, 02/02/2012, 03/28/2018, 12/30/2019   PFIZER Comirnaty(Gray Top)Covid-19 Tri-Sucrose Vaccine 09/11/2020   PFIZER(Purple Top)SARS-COV-2 Vaccination 06/25/2019, 07/16/2019, 03/11/2020, 09/11/2020   Td 12/23/2015   Tdap 03/28/2009, 09/20/2017, 10/29/2018   Zoster Recombinat (Shingrix) 10/29/2018, 04/21/2019   Zoster, Live 10/29/2018, 04/21/2019     Objective: Vital Signs: BP 106/71 (BP Location: Left Arm, Patient Position: Sitting, Cuff Size: Normal)   Pulse 67   Resp 16   Ht _0  (1.702 m)   Wt 141 lb 6.4 oz (64.1 kg)   BMI 22.15 kg/m    Physical Exam Eyes:     Conjunctiva/sclera: Conjunctivae normal.  Cardiovascular:     Rate and Rhythm: Normal rate and regular rhythm.  Pulmonary:     Effort: Pulmonary effort is normal.     Breath sounds: Normal breath sounds.  Musculoskeletal:     Comments: Dusky coloration of 4th-5th fingers in distal finger only, right 3rd digit with pallor over distal phalanx, no pitting no ulcers or skin peeling  Neurological:     Mental Status: He is alert.  Psychiatric:        Mood and Affect: Mood normal.      Musculoskeletal Exam:  Shoulders full ROM no tenderness or swelling Elbows full ROM no tenderness or swelling Wrists full ROM no tenderness or swelling Fingers full ROM no tenderness or swelling Knees full ROM no tenderness or swelling   Investigation: No additional findings.  Imaging: No results found.  Recent Labs: Lab Results  Component Value  Date   WBC 3.9 (L) 10/09/2020   HGB 14.9 10/09/2020   PLT 237 10/09/2020   NA 135 10/09/2020   K 4.2 10/09/2020   CL 104 10/09/2020   CO2 22 10/09/2020   GLUCOSE 92 10/09/2020   BUN 18 10/09/2020   CREATININE 1.64 (H) 10/09/2020   BILITOT 1.0 05/19/2020   ALKPHOS 74 05/19/2020   AST 14 05/19/2020   ALT 14 05/19/2020   PROT 6.5 11/02/2020   ALBUMIN 4.2 05/19/2020   CALCIUM 9.2 10/09/2020    Speciality Comments: No specialty comments available.  Procedures:  No procedures performed Allergies: Oatmeal   Assessment / Plan:     Visit Diagnoses: Raynaud's disease with gangrene ([place])  Worsening symptoms compared to last visit likely related to onset of colder weather for the year.  Most severely affected area on exam  today is the right third finger but no current ischemic injury.Marland Kitchen  He did not tolerate titration of amlodipine up to 10 mg well so continue 5 mg daily.  Recommended trying addition of sildenafil 20 mg titrate up to twice daily if tolerated.  If combination with calcium channel blocker causes hypotensive symptoms would recommend a switch entirely because symptoms do not appear very well controlled with just amlodipine 5 mg.  Discussed potential risks of medication including hypotension, migraines, and need to avoid combination with nitrate medications.  Vasculitis ([place]) - Plan: Sedimentation rate, C-reactive protein  Initially had a concern for small or medium vessel vasculitic process based on initial presentation with critical digital ischemia.  Initial evaluation last year did not identify underlying pathology.  We will recheck inflammatory markers today as he is having increasing symptoms for evidence of systemic inflammation.  Orders: Orders Placed This Encounter  Procedures   Sedimentation rate   C-reactive protein   No orders of the defined types were placed in this encounter.    Follow-Up Instructions: Return in about 3 months (around 05/17/2022) for  Raynaud/vasculitis on CCB/PDE5 start f/u 81mo.   CCollier Salina MD  Note - This record has been created using [employer]  Chart creation errors have been sought, but may not always  have been located. Such creation errors do not reflect on  the standard of medical care.

## 2022-02-06 ENCOUNTER — Other Ambulatory Visit: Payer: Self-pay | Admitting: Internal Medicine

## 2022-02-06 DIAGNOSIS — I7301 Raynaud's syndrome with gangrene: Secondary | ICD-10-CM

## 2022-02-06 DIAGNOSIS — I776 Arteritis, unspecified: Secondary | ICD-10-CM

## 2022-02-08 DIAGNOSIS — M4712 Other spondylosis with myelopathy, cervical region: Secondary | ICD-10-CM | POA: Diagnosis not present

## 2022-02-08 DIAGNOSIS — R269 Unspecified abnormalities of gait and mobility: Secondary | ICD-10-CM | POA: Diagnosis not present

## 2022-02-08 DIAGNOSIS — M5416 Radiculopathy, lumbar region: Secondary | ICD-10-CM | POA: Diagnosis not present

## 2022-02-10 DIAGNOSIS — M12271 Villonodular synovitis (pigmented), right ankle and foot: Secondary | ICD-10-CM | POA: Diagnosis not present

## 2022-02-10 DIAGNOSIS — M2041 Other hammer toe(s) (acquired), right foot: Secondary | ICD-10-CM | POA: Diagnosis not present

## 2022-02-14 ENCOUNTER — Encounter: Payer: Self-pay | Admitting: Internal Medicine

## 2022-02-14 ENCOUNTER — Ambulatory Visit: Payer: Medicare Other | Attending: Internal Medicine | Admitting: Internal Medicine

## 2022-02-14 VITALS — BP 106/71 | HR 67 | Resp 16 | Ht 67.0 in | Wt 141.4 lb

## 2022-02-14 DIAGNOSIS — I7301 Raynaud's syndrome with gangrene: Secondary | ICD-10-CM | POA: Diagnosis not present

## 2022-02-14 DIAGNOSIS — I776 Arteritis, unspecified: Secondary | ICD-10-CM | POA: Diagnosis not present

## 2022-02-15 DIAGNOSIS — M25571 Pain in right ankle and joints of right foot: Secondary | ICD-10-CM | POA: Diagnosis not present

## 2022-02-15 DIAGNOSIS — R262 Difficulty in walking, not elsewhere classified: Secondary | ICD-10-CM | POA: Diagnosis not present

## 2022-02-15 DIAGNOSIS — M6281 Muscle weakness (generalized): Secondary | ICD-10-CM | POA: Diagnosis not present

## 2022-02-15 DIAGNOSIS — M25671 Stiffness of right ankle, not elsewhere classified: Secondary | ICD-10-CM | POA: Diagnosis not present

## 2022-02-15 LAB — SEDIMENTATION RATE: Sed Rate: 2 mm/h (ref 0–20)

## 2022-02-15 LAB — C-REACTIVE PROTEIN: CRP: 0.4 mg/L (ref <8.0)

## 2022-02-15 MED ORDER — SILDENAFIL CITRATE 20 MG PO TABS: 20.0000 mg | ORAL_TABLET | Freq: Two times a day (BID) | ORAL | 2 refills | Status: DC

## 2022-02-15 NOTE — Progress Notes (Signed)
Lab results remain negative for any markers of systemic inflammation. For his ongoing symptoms I recommend starting the sildenafil as planned, 20 mg twice daily.

## 2022-02-15 NOTE — Addendum Note (Signed)
Addended by: Collier Salina on: 02/15/2022 01:11 PM   Modules accepted: Orders

## 2022-02-16 ENCOUNTER — Telehealth: Payer: Self-pay | Admitting: *Deleted

## 2022-02-16 NOTE — Telephone Encounter (Signed)
Submitted a Prior Authorization request to [facility] for  Sildenafil  via CoverMyMeds. Will update once we receive a response.  Key: [id number]

## 2022-02-18 NOTE — Telephone Encounter (Signed)
Received notification from [facility] regarding a prior authorization for  sildenafil . Authorization has been APPROVED from 02/15/22 to 03/18/2023.   Authorization #  [id number]  Knox Saliva, PharmD, MPH, BCPS, CPP Clinical Pharmacist (Rheumatology and Pulmonology)

## 2022-03-01 DIAGNOSIS — M25571 Pain in right ankle and joints of right foot: Secondary | ICD-10-CM | POA: Diagnosis not present

## 2022-03-01 DIAGNOSIS — R262 Difficulty in walking, not elsewhere classified: Secondary | ICD-10-CM | POA: Diagnosis not present

## 2022-03-01 DIAGNOSIS — M25671 Stiffness of right ankle, not elsewhere classified: Secondary | ICD-10-CM | POA: Diagnosis not present

## 2022-03-01 DIAGNOSIS — M6281 Muscle weakness (generalized): Secondary | ICD-10-CM | POA: Diagnosis not present

## 2022-03-04 DIAGNOSIS — M25571 Pain in right ankle and joints of right foot: Secondary | ICD-10-CM | POA: Diagnosis not present

## 2022-03-04 DIAGNOSIS — M6281 Muscle weakness (generalized): Secondary | ICD-10-CM | POA: Diagnosis not present

## 2022-03-04 DIAGNOSIS — M25671 Stiffness of right ankle, not elsewhere classified: Secondary | ICD-10-CM | POA: Diagnosis not present

## 2022-03-04 DIAGNOSIS — R262 Difficulty in walking, not elsewhere classified: Secondary | ICD-10-CM | POA: Diagnosis not present

## 2022-03-09 DIAGNOSIS — R262 Difficulty in walking, not elsewhere classified: Secondary | ICD-10-CM | POA: Diagnosis not present

## 2022-03-09 DIAGNOSIS — M25571 Pain in right ankle and joints of right foot: Secondary | ICD-10-CM | POA: Diagnosis not present

## 2022-03-09 DIAGNOSIS — M6281 Muscle weakness (generalized): Secondary | ICD-10-CM | POA: Diagnosis not present

## 2022-03-09 DIAGNOSIS — M25671 Stiffness of right ankle, not elsewhere classified: Secondary | ICD-10-CM | POA: Diagnosis not present

## 2022-03-15 DIAGNOSIS — M6281 Muscle weakness (generalized): Secondary | ICD-10-CM | POA: Diagnosis not present

## 2022-03-15 DIAGNOSIS — R262 Difficulty in walking, not elsewhere classified: Secondary | ICD-10-CM | POA: Diagnosis not present

## 2022-03-15 DIAGNOSIS — M25571 Pain in right ankle and joints of right foot: Secondary | ICD-10-CM | POA: Diagnosis not present

## 2022-03-15 DIAGNOSIS — M25671 Stiffness of right ankle, not elsewhere classified: Secondary | ICD-10-CM | POA: Diagnosis not present

## 2022-03-23 DIAGNOSIS — M25671 Stiffness of right ankle, not elsewhere classified: Secondary | ICD-10-CM | POA: Diagnosis not present

## 2022-03-23 DIAGNOSIS — M6281 Muscle weakness (generalized): Secondary | ICD-10-CM | POA: Diagnosis not present

## 2022-03-23 DIAGNOSIS — R262 Difficulty in walking, not elsewhere classified: Secondary | ICD-10-CM | POA: Diagnosis not present

## 2022-03-23 DIAGNOSIS — M25571 Pain in right ankle and joints of right foot: Secondary | ICD-10-CM | POA: Diagnosis not present

## 2022-03-30 DIAGNOSIS — M25671 Stiffness of right ankle, not elsewhere classified: Secondary | ICD-10-CM | POA: Diagnosis not present

## 2022-03-30 DIAGNOSIS — R262 Difficulty in walking, not elsewhere classified: Secondary | ICD-10-CM | POA: Diagnosis not present

## 2022-03-30 DIAGNOSIS — M6281 Muscle weakness (generalized): Secondary | ICD-10-CM | POA: Diagnosis not present

## 2022-03-30 DIAGNOSIS — M25571 Pain in right ankle and joints of right foot: Secondary | ICD-10-CM | POA: Diagnosis not present

## 2022-04-01 DIAGNOSIS — M6281 Muscle weakness (generalized): Secondary | ICD-10-CM | POA: Diagnosis not present

## 2022-04-01 DIAGNOSIS — M25671 Stiffness of right ankle, not elsewhere classified: Secondary | ICD-10-CM | POA: Diagnosis not present

## 2022-04-01 DIAGNOSIS — M25571 Pain in right ankle and joints of right foot: Secondary | ICD-10-CM | POA: Diagnosis not present

## 2022-04-01 DIAGNOSIS — R262 Difficulty in walking, not elsewhere classified: Secondary | ICD-10-CM | POA: Diagnosis not present

## 2022-04-06 DIAGNOSIS — R262 Difficulty in walking, not elsewhere classified: Secondary | ICD-10-CM | POA: Diagnosis not present

## 2022-04-06 DIAGNOSIS — M25671 Stiffness of right ankle, not elsewhere classified: Secondary | ICD-10-CM | POA: Diagnosis not present

## 2022-04-06 DIAGNOSIS — M6281 Muscle weakness (generalized): Secondary | ICD-10-CM | POA: Diagnosis not present

## 2022-04-06 DIAGNOSIS — M25571 Pain in right ankle and joints of right foot: Secondary | ICD-10-CM | POA: Diagnosis not present

## 2022-04-08 DIAGNOSIS — M25571 Pain in right ankle and joints of right foot: Secondary | ICD-10-CM | POA: Diagnosis not present

## 2022-04-08 DIAGNOSIS — R262 Difficulty in walking, not elsewhere classified: Secondary | ICD-10-CM | POA: Diagnosis not present

## 2022-04-08 DIAGNOSIS — M6281 Muscle weakness (generalized): Secondary | ICD-10-CM | POA: Diagnosis not present

## 2022-04-08 DIAGNOSIS — M25671 Stiffness of right ankle, not elsewhere classified: Secondary | ICD-10-CM | POA: Diagnosis not present

## 2022-04-13 DIAGNOSIS — M25571 Pain in right ankle and joints of right foot: Secondary | ICD-10-CM | POA: Diagnosis not present

## 2022-04-13 DIAGNOSIS — M25671 Stiffness of right ankle, not elsewhere classified: Secondary | ICD-10-CM | POA: Diagnosis not present

## 2022-04-13 DIAGNOSIS — R262 Difficulty in walking, not elsewhere classified: Secondary | ICD-10-CM | POA: Diagnosis not present

## 2022-04-13 DIAGNOSIS — M6281 Muscle weakness (generalized): Secondary | ICD-10-CM | POA: Diagnosis not present

## 2022-04-14 DIAGNOSIS — M5416 Radiculopathy, lumbar region: Secondary | ICD-10-CM | POA: Diagnosis not present

## 2022-04-15 DIAGNOSIS — M6281 Muscle weakness (generalized): Secondary | ICD-10-CM | POA: Diagnosis not present

## 2022-04-15 DIAGNOSIS — M25571 Pain in right ankle and joints of right foot: Secondary | ICD-10-CM | POA: Diagnosis not present

## 2022-04-15 DIAGNOSIS — M25671 Stiffness of right ankle, not elsewhere classified: Secondary | ICD-10-CM | POA: Diagnosis not present

## 2022-04-15 DIAGNOSIS — R262 Difficulty in walking, not elsewhere classified: Secondary | ICD-10-CM | POA: Diagnosis not present

## 2022-04-17 ENCOUNTER — Other Ambulatory Visit: Payer: Self-pay | Admitting: Internal Medicine

## 2022-04-17 DIAGNOSIS — I7301 Raynaud's syndrome with gangrene: Secondary | ICD-10-CM

## 2022-04-17 DIAGNOSIS — I776 Arteritis, unspecified: Secondary | ICD-10-CM

## 2022-04-18 DIAGNOSIS — N390 Urinary tract infection, site not specified: Secondary | ICD-10-CM | POA: Diagnosis not present

## 2022-04-18 NOTE — Telephone Encounter (Signed)
Next Visit: 05/18/2022  Last Visit: 02/14/2022  Last Fill: 01/26/2022  Dx:  Raynaud's disease with gangrene   Current Dose per office note on 02/14/2022: amlodipine 5 mg daily  Okay to refill Amlodipine?

## 2022-04-22 DIAGNOSIS — M6281 Muscle weakness (generalized): Secondary | ICD-10-CM | POA: Diagnosis not present

## 2022-04-22 DIAGNOSIS — R262 Difficulty in walking, not elsewhere classified: Secondary | ICD-10-CM | POA: Diagnosis not present

## 2022-04-22 DIAGNOSIS — M25571 Pain in right ankle and joints of right foot: Secondary | ICD-10-CM | POA: Diagnosis not present

## 2022-04-22 DIAGNOSIS — M25671 Stiffness of right ankle, not elsewhere classified: Secondary | ICD-10-CM | POA: Diagnosis not present

## 2022-04-27 DIAGNOSIS — R262 Difficulty in walking, not elsewhere classified: Secondary | ICD-10-CM | POA: Diagnosis not present

## 2022-04-27 DIAGNOSIS — M25671 Stiffness of right ankle, not elsewhere classified: Secondary | ICD-10-CM | POA: Diagnosis not present

## 2022-04-27 DIAGNOSIS — M25571 Pain in right ankle and joints of right foot: Secondary | ICD-10-CM | POA: Diagnosis not present

## 2022-04-27 DIAGNOSIS — M6281 Muscle weakness (generalized): Secondary | ICD-10-CM | POA: Diagnosis not present

## 2022-04-29 DIAGNOSIS — M25571 Pain in right ankle and joints of right foot: Secondary | ICD-10-CM | POA: Diagnosis not present

## 2022-04-29 DIAGNOSIS — M25671 Stiffness of right ankle, not elsewhere classified: Secondary | ICD-10-CM | POA: Diagnosis not present

## 2022-04-29 DIAGNOSIS — R262 Difficulty in walking, not elsewhere classified: Secondary | ICD-10-CM | POA: Diagnosis not present

## 2022-04-29 DIAGNOSIS — M6281 Muscle weakness (generalized): Secondary | ICD-10-CM | POA: Diagnosis not present

## 2022-05-03 DIAGNOSIS — M5416 Radiculopathy, lumbar region: Secondary | ICD-10-CM | POA: Diagnosis not present

## 2022-05-03 DIAGNOSIS — G894 Chronic pain syndrome: Secondary | ICD-10-CM | POA: Diagnosis not present

## 2022-05-04 DIAGNOSIS — R262 Difficulty in walking, not elsewhere classified: Secondary | ICD-10-CM | POA: Diagnosis not present

## 2022-05-04 DIAGNOSIS — M25571 Pain in right ankle and joints of right foot: Secondary | ICD-10-CM | POA: Diagnosis not present

## 2022-05-04 DIAGNOSIS — M25671 Stiffness of right ankle, not elsewhere classified: Secondary | ICD-10-CM | POA: Diagnosis not present

## 2022-05-04 DIAGNOSIS — M6281 Muscle weakness (generalized): Secondary | ICD-10-CM | POA: Diagnosis not present

## 2022-05-06 DIAGNOSIS — R262 Difficulty in walking, not elsewhere classified: Secondary | ICD-10-CM | POA: Diagnosis not present

## 2022-05-06 DIAGNOSIS — M6281 Muscle weakness (generalized): Secondary | ICD-10-CM | POA: Diagnosis not present

## 2022-05-06 DIAGNOSIS — M25671 Stiffness of right ankle, not elsewhere classified: Secondary | ICD-10-CM | POA: Diagnosis not present

## 2022-05-06 DIAGNOSIS — M25571 Pain in right ankle and joints of right foot: Secondary | ICD-10-CM | POA: Diagnosis not present

## 2022-05-07 ENCOUNTER — Other Ambulatory Visit: Payer: Self-pay | Admitting: Internal Medicine

## 2022-05-07 DIAGNOSIS — I776 Arteritis, unspecified: Secondary | ICD-10-CM

## 2022-05-07 DIAGNOSIS — I7301 Raynaud's syndrome with gangrene: Secondary | ICD-10-CM

## 2022-05-11 DIAGNOSIS — M25671 Stiffness of right ankle, not elsewhere classified: Secondary | ICD-10-CM | POA: Diagnosis not present

## 2022-05-11 DIAGNOSIS — R262 Difficulty in walking, not elsewhere classified: Secondary | ICD-10-CM | POA: Diagnosis not present

## 2022-05-11 DIAGNOSIS — M6281 Muscle weakness (generalized): Secondary | ICD-10-CM | POA: Diagnosis not present

## 2022-05-11 DIAGNOSIS — M25571 Pain in right ankle and joints of right foot: Secondary | ICD-10-CM | POA: Diagnosis not present

## 2022-05-13 DIAGNOSIS — M25571 Pain in right ankle and joints of right foot: Secondary | ICD-10-CM | POA: Diagnosis not present

## 2022-05-13 DIAGNOSIS — R262 Difficulty in walking, not elsewhere classified: Secondary | ICD-10-CM | POA: Diagnosis not present

## 2022-05-13 DIAGNOSIS — M6281 Muscle weakness (generalized): Secondary | ICD-10-CM | POA: Diagnosis not present

## 2022-05-13 DIAGNOSIS — M25671 Stiffness of right ankle, not elsewhere classified: Secondary | ICD-10-CM | POA: Diagnosis not present

## 2022-05-13 DIAGNOSIS — N390 Urinary tract infection, site not specified: Secondary | ICD-10-CM | POA: Diagnosis not present

## 2022-05-17 ENCOUNTER — Other Ambulatory Visit: Payer: Self-pay | Admitting: Internal Medicine

## 2022-05-17 DIAGNOSIS — I7301 Raynaud's syndrome with gangrene: Secondary | ICD-10-CM

## 2022-05-17 NOTE — Telephone Encounter (Signed)
Next Visit: 05/18/2022  Last Visit: 02/14/2022  Last Fill:02/15/2022  Dx:  Raynaud's disease with gangrene   Current Dose per office note on 02/14/2022: sildenafil 20 mg titrate up to twice daily if tolerated.   Okay to refill Sildenafil?

## 2022-05-17 NOTE — Progress Notes (Unsigned)
Office Visit Note  Patient: Danny Hopkins             Date of Birth: 05/31/1960           MRN: [id number]             PCP: Harlan Stains, MD Referring: Harlan Stains, MD Visit Date: 05/18/2022   Subjective:  No chief complaint on file.   History of Present Illness: Danny Hopkins is a 62 y.o. male here for follow up ***   Previous HPI 02/14/22 Danny Hopkins is a 62 y.o. male here for follow up follow up for raynaud's/peripheral vascular insufficiency on amlodipine 5 mg daily. Since our last visit he has done overall well. Seeing increased discoloration in right hand 3rd-5th fingers again since temperatures cooling off. Often feels some tingling and mild discomfort when happening. No new lesions or ulcers, 3rd finger nail is growing abnormally. More recently also started to see changes in distal left 4th and 5th fingers.   Previous HPI 07/20/2021 Danny Hopkins is a 62 y.o. male here for follow up for raynaud's/peripheral vascular insufficiency after increasing amlodipine dose to 10 mg daily. He has some improvement in symptoms no new lesions although he still sees blue discoloration in fingers intermittently. He has noticed several episodes of lightheadedness when standing and 3 times had to lie back down and wait to recover his symptoms.   Previous HPI 02/15/21 Danny Hopkins is a 62 y.o. male here for follow up for peripheral vascular insufficiency with ulceration of the right 4th finger and was started on amlodipine 5 mg daily after negative workup of systemic vasculitis antibodies. Since our last visit fingers are doing overall better he is noticing increase in blue discoloration of 5th fingers on both hands with the colder weather. His right 3rd finger has developed a few spots with redness in the finger and nail. His follow up in primary care clinic has shown some borderline low BP on the amlodipine.   Previous HPI: 11/02/20 Danny Hopkins is a 62 y.o.  male here for evaluation for vasculitis with digital ulcer of right 4th finger. He has felt in overall usual health but had bene noticing cold and numbness changes in his fingertips for months affecting the right 3-5th fingers. He developed a wound on the tip of his 4th finger in June he does not remember a particular injury or maybe a small abrasion starting this. He did not think it was severe but subsequently developed black discoloration of his whole fingertip peeling of the skin. He saw Dr. Burney Gauze for evaluation and subsequent upper extremity arteriography with findings of no patent palmar arch and diminished digital artery perfusions with normal proximal vasculature up to the wrist. Otherwise he denies feeling other significant symptom or medical changes lately. No skin rashes, lesions, no discoloration or ulceration of other digits or in the past. He has chronic lumbar spine degenerative arthritis with radiculopathy. He has chronic left sided residual weakness due to previous stroke. Workup for the stroke reportedly without any specific secondary underlying cause identified.   Labs reviewed 09/2020 CBC wnl BMP eGFR 48 UA contaminated, +Ca Oxalate, +glucose   Imaging reviewed 10/09/20 RUE arteriogram IMPRESSION:  Status post ultrasound guided access right common femoral artery for right upper extremity angiogram.  Findings reveal no significant atherosclerotic changes, with no evidence to suggest atheroembolic phenomenon as the source of the fourth digit ulcer, as the arteries are of normal course caliber and contour  to the wrist.  The angiographic findings of the right hand are most likely a manifestation of either Buerger's disease (thrombo angiitis obliterans) or other connective tissue disorder/rheumatologic disorder. There was a mild improvement with administration of  nitroglycerin/vasodilators.   10/05/20 LE Vascular ultrasound IMPRESSION: 1. Normal bilateral resting ankle-brachial  indices. 2. Markedly blunted digital waveforms bilaterally suggests small vessels disease.   No Rheumatology ROS completed.   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 ([place]) 02/15/2021   Therapeutic opioid induced constipation 01/21/2021   Rectal bleeding 01/21/2021   Internal hemorrhoids 01/21/2021   Vasculitis ([place]) 11/02/2020   Lumbar radiculopathy 09/17/2020   Allergic rhinitis 08/23/2020   Acute dysfunction of right eustachian tube 08/23/2020   Dysphagia 06/10/2020   Sore in nose 05/21/2020   Weight loss 05/19/2020   Left arm pain 04/19/2020   Migraines 01/07/2020   Depression 08/23/2019   Spasticity 05/29/2019   Vitamin D deficiency 08/25/2018   Laceration of left hand 09/27/2017   Acute pharyngitis 07/14/2017   Chronic tonsillitis 07/12/2017   Chronic pain syndrome 06/22/2017   Bradycardia 06/22/2017   Abnormal ECG 06/22/2017   Former smoker 06/21/2016   Scalp pain 07/30/2015   Elevated blood-pressure reading, without diagnosis of hypertension 06/24/2015   Dandruff 06/24/2015   Lymphadenitis 06/24/2015   Burning with urination 01/25/2015   Balanitis 01/25/2015   Sleeping difficulties 02/04/2014   Bladder neck obstruction 02/04/2014   Hyperlipidemia 02/04/2014   Anterior spinal artery compression syndrome 10/10/2013   Other malaise and fatigue 07/24/2013   Stroke ([place]) 07/24/2013   Encounter for well adult exam with abnormal findings 07/24/2013   Neurologic gait disorder 07/24/2013   Other intervertebral disc displacement, lumbar region 05/29/2013   Cervical spondylosis with myelopathy 01/30/2013   Insomnia 10/12/2012   Generalized anxiety disorder 10/12/2012    Eczema 10/12/2012   History of stroke 10/12/2012   Corn or callus 07/07/2010   ED (erectile dysfunction) of organic origin 07/07/2010   Neurogenic bladder 06/10/2010   Unspecified urinary incontinence 06/10/2010   Hemiplegia ([place]) 06/09/2010    Past Medical History:  Diagnosis Date   Anxiety    07/2019   Arthritis    Depression 08/23/2019   History of chicken pox    Hyperlipidemia    2015   Neurologic gait disorder 07/24/2013   Chronic post stroke   Stroke [facility])    2006    Family History  Problem Relation Age of Onset   Healthy Sister    Healthy Sister    Healthy Brother    Healthy Brother    Stomach cancer Neg Hx    Rectal cancer Neg Hx    Pancreatic cancer Neg Hx    Colon cancer Neg Hx    Colon polyps Neg Hx    Esophageal cancer Neg Hx    Past Surgical History:  Procedure Laterality Date   cervical staph infection     2006   COLONOSCOPY     2015   INGUINAL HERNIA REPAIR     kindergarten   IR ANGIOGRAM EXTREMITY RIGHT  10/09/2020   IR INTRAVASCULAR ULTRASOUND NON CORONARY  10/13/2020   IR RADIOLOGIST EVAL & MGMT  09/24/2020   IR RADIOLOGIST EVAL & MGMT  10/27/2020  IR US GUIDE VASC ACCESS RIGHT  10/09/2020   Social History   Social History Narrative   Regular exercise-no   Caffeine Use-no   Right handed 12/25/20 report he is not able to write his name well at all now   Immunization History  Administered Date(s) Administered   Influenza Inj Mdck Quad Pf 12/09/2017   Influenza Split 01/05/2021   Influenza,inj,Quad PF,6+ Mos 02/04/2014, 01/20/2015, 12/23/2015, 12/13/2016, 12/10/2018, 12/26/2020   Influenza-Unspecified 03/29/2011, 02/02/2012, 03/28/2018, 12/30/2019   PFIZER Comirnaty(Gray Top)Covid-19 Tri-Sucrose Vaccine 09/11/2020   PFIZER(Purple Top)SARS-COV-2 Vaccination 06/25/2019, 07/16/2019, 03/11/2020, 09/11/2020   Td 12/23/2015   Tdap 03/28/2009, 09/20/2017, 10/29/2018   Zoster Recombinat (Shingrix) 10/29/2018, 04/21/2019   Zoster, Live 10/29/2018,  04/21/2019     Objective: Vital Signs: There were no vitals taken for this visit.   Physical Exam   Musculoskeletal Exam: ***  CDAI Exam: CDAI Score: -- Patient Global: --; Provider Global: -- Swollen: --; Tender: -- Joint Exam 05/18/2022   No joint exam has been documented for this visit   There is currently no information documented on the homunculus. Go to the Rheumatology activity and complete the homunculus joint exam.  Investigation: No additional findings.  Imaging: No results found.  Recent Labs: Lab Results  Component Value Date   WBC 3.9 (L) 10/09/2020   HGB 14.9 10/09/2020   PLT 237 10/09/2020   NA 135 10/09/2020   K 4.2 10/09/2020   CL 104 10/09/2020   CO2 22 10/09/2020   GLUCOSE 92 10/09/2020   BUN 18 10/09/2020   CREATININE 1.64 (H) 10/09/2020   BILITOT 1.0 05/19/2020   ALKPHOS 74 05/19/2020   AST 14 05/19/2020   ALT 14 05/19/2020   PROT 6.5 11/02/2020   ALBUMIN 4.2 05/19/2020   CALCIUM 9.2 10/09/2020    Speciality Comments: No specialty comments available.  Procedures:  No procedures performed Allergies: Oatmeal   Assessment / Plan:     Visit Diagnoses: No diagnosis found.  ***  Orders: No orders of the defined types were placed in this encounter.  No orders of the defined types were placed in this encounter.    Follow-Up Instructions: No follow-ups on file.   Collier Salina, MD  Note - This record has been created using [employer].  Chart creation errors have been sought, but may not always  have been located. Such creation errors do not reflect on  the standard of medical care.

## 2022-05-18 ENCOUNTER — Ambulatory Visit: Payer: 59 | Attending: Internal Medicine | Admitting: Internal Medicine

## 2022-05-18 ENCOUNTER — Encounter: Payer: Self-pay | Admitting: Internal Medicine

## 2022-05-18 VITALS — BP 94/69 | HR 83 | Ht 67.0 in | Wt 140.0 lb

## 2022-05-18 DIAGNOSIS — I776 Arteritis, unspecified: Secondary | ICD-10-CM

## 2022-05-18 DIAGNOSIS — I7301 Raynaud's syndrome with gangrene: Secondary | ICD-10-CM | POA: Diagnosis not present

## 2022-05-18 MED ORDER — AMLODIPINE BESYLATE 5 MG PO TABS: 5.0000 mg | ORAL_TABLET | Freq: Every day | ORAL | 1 refills | Status: DC

## 2022-05-18 NOTE — Patient Instructions (Signed)
Symptoms look good I don't see any new evidence of injury with your fingers. I recommend continuing the amlodipine 33m once every day.  Continue the sildenafil 20 mg twice per day, if you find that symptoms are doing really well in the warmer months, you can try decreasing this medication.

## 2022-05-20 DIAGNOSIS — M25571 Pain in right ankle and joints of right foot: Secondary | ICD-10-CM | POA: Diagnosis not present

## 2022-05-20 DIAGNOSIS — R262 Difficulty in walking, not elsewhere classified: Secondary | ICD-10-CM | POA: Diagnosis not present

## 2022-05-20 DIAGNOSIS — M6281 Muscle weakness (generalized): Secondary | ICD-10-CM | POA: Diagnosis not present

## 2022-05-20 DIAGNOSIS — M25671 Stiffness of right ankle, not elsewhere classified: Secondary | ICD-10-CM | POA: Diagnosis not present

## 2022-05-25 DIAGNOSIS — M792 Neuralgia and neuritis, unspecified: Secondary | ICD-10-CM | POA: Diagnosis not present

## 2022-05-25 DIAGNOSIS — L84 Corns and callosities: Secondary | ICD-10-CM | POA: Diagnosis not present

## 2022-05-25 DIAGNOSIS — M19071 Primary osteoarthritis, right ankle and foot: Secondary | ICD-10-CM | POA: Diagnosis not present

## 2022-05-25 DIAGNOSIS — R262 Difficulty in walking, not elsewhere classified: Secondary | ICD-10-CM | POA: Diagnosis not present

## 2022-06-30 ENCOUNTER — Ambulatory Visit (INDEPENDENT_AMBULATORY_CARE_PROVIDER_SITE_OTHER): Payer: 59 | Admitting: Family Medicine

## 2022-06-30 ENCOUNTER — Other Ambulatory Visit: Payer: Self-pay

## 2022-06-30 ENCOUNTER — Encounter: Payer: Self-pay | Admitting: Family Medicine

## 2022-06-30 VITALS — BP 98/58 | HR 68 | Ht 67.0 in | Wt 147.6 lb

## 2022-06-30 DIAGNOSIS — M25512 Pain in left shoulder: Secondary | ICD-10-CM

## 2022-06-30 DIAGNOSIS — G8929 Other chronic pain: Secondary | ICD-10-CM | POA: Diagnosis not present

## 2022-06-30 NOTE — Progress Notes (Signed)
   Rubin Payor, PhD, LAT, ATC acting as a scribe for Clementeen Graham, MD.  Danny Hopkins is a 62 y.o. male who presents to [facility] at [facility] today for exacerbation of his chronic L shoulder pain. Pt was last seen by Dr. Denyse Amass on 12/14/21 and was given a left subacromial steroid injection and was advised to continue HEP.  Today, patient reports worsening of sx over the past week. Denies new injury to the shoulder. Denies radiating pain. Sx worse in the morning after laying on the shoulder. Denies mechanical sx. No change in ROM. No treatment modalities for this flare-up.   Dx imaging: 04/22/20 L shoulder XR   Pertinent review of systems: No fevers or chills  Relevant historical information: History of a stroke   Exam:  BP (!) 98/58   Pulse 68   Ht  (1.702 m)   Wt 147 lb 9.6 oz (67 kg)   SpO2 97%   BMI 23.12 kg/m  General: Well Developed, well nourished, and in no acute distress.   MSK: Left shoulder: Intact muscle bulk.  Normal motion pain with abduction.  Positive Hawkins and Neer's test.  Neuro: Significant abnormal gait.  Lab and Radiology Results  Procedure: Real-time Ultrasound Guided Injection of left shoulder subacromial bursa Device: Philips Affiniti 50G Images permanently stored and available for review in PACS Verbal informed consent obtained.  Discussed risks and benefits of procedure. Warned about infection, bleeding, hyperglycemia damage to structures among others. Patient expresses understanding and agreement Time-out conducted.   Noted no overlying erythema, induration, or other signs of local infection.   Skin prepped in a sterile fashion.   Local anesthesia: Topical Ethyl chloride.   With sterile technique and under real time ultrasound guidance: 40 mg of Kenalog and 2 mL of Marcaine injected into subacromial bursa. Fluid seen entering the bursa.   Completed without difficulty   Pain immediately resolved suggesting accurate  placement of the medication.   Advised to call if fevers/chills, erythema, induration, drainage, or persistent bleeding.   Images permanently stored and available for review in the ultrasound unit.  Impression: Technically successful ultrasound guided injection.         Assessment and Plan: 62 y.o. male with left shoulder pain due to subacromial bursitis.  This is a chronic recurrent problem with an acute exacerbation.  Plan for steroid injection today.  Continue home exercise program previously taught and check back as needed.   PDMP not reviewed this encounter. Orders Placed This Encounter  Procedures   [place] LIMITED JOINT SPACE STRUCTURES UP LEFT(NO LINKED CHARGES)    Order Specific Question:   Reason for Exam (SYMPTOM  OR DIAGNOSIS REQUIRED)    Answer:   left shoulder pain    Order Specific Question:   Preferred imaging location?    Answer:   [facility]   No orders of the defined types were placed in this encounter.    Discussed warning signs or symptoms. Please see discharge instructions. Patient expresses understanding.   The above documentation has been reviewed and is accurate and complete Clementeen Graham, M.D.

## 2022-06-30 NOTE — Patient Instructions (Signed)
Thank you for coming in today.   Call or go to the ER if you develop a large red swollen joint with extreme pain or oozing puss.    Return as needed.  

## 2022-07-05 DIAGNOSIS — R262 Difficulty in walking, not elsewhere classified: Secondary | ICD-10-CM | POA: Diagnosis not present

## 2022-07-05 DIAGNOSIS — R238 Other skin changes: Secondary | ICD-10-CM | POA: Diagnosis not present

## 2022-07-14 DIAGNOSIS — R262 Difficulty in walking, not elsewhere classified: Secondary | ICD-10-CM | POA: Diagnosis not present

## 2022-07-14 DIAGNOSIS — R238 Other skin changes: Secondary | ICD-10-CM | POA: Diagnosis not present

## 2022-07-18 DIAGNOSIS — M5416 Radiculopathy, lumbar region: Secondary | ICD-10-CM | POA: Diagnosis not present

## 2022-07-27 DIAGNOSIS — I776 Arteritis, unspecified: Secondary | ICD-10-CM | POA: Diagnosis not present

## 2022-07-27 DIAGNOSIS — E785 Hyperlipidemia, unspecified: Secondary | ICD-10-CM | POA: Diagnosis not present

## 2022-07-27 DIAGNOSIS — K5900 Constipation, unspecified: Secondary | ICD-10-CM | POA: Diagnosis not present

## 2022-07-27 DIAGNOSIS — Z9989 Dependence on other enabling machines and devices: Secondary | ICD-10-CM | POA: Diagnosis not present

## 2022-07-27 DIAGNOSIS — G8194 Hemiplegia, unspecified affecting left nondominant side: Secondary | ICD-10-CM | POA: Diagnosis not present

## 2022-07-27 DIAGNOSIS — K219 Gastro-esophageal reflux disease without esophagitis: Secondary | ICD-10-CM | POA: Diagnosis not present

## 2022-07-28 DIAGNOSIS — G894 Chronic pain syndrome: Secondary | ICD-10-CM | POA: Diagnosis not present

## 2022-07-28 DIAGNOSIS — R269 Unspecified abnormalities of gait and mobility: Secondary | ICD-10-CM | POA: Diagnosis not present

## 2022-07-28 DIAGNOSIS — M5416 Radiculopathy, lumbar region: Secondary | ICD-10-CM | POA: Diagnosis not present

## 2022-08-11 DIAGNOSIS — M792 Neuralgia and neuritis, unspecified: Secondary | ICD-10-CM | POA: Diagnosis not present

## 2022-08-11 DIAGNOSIS — R262 Difficulty in walking, not elsewhere classified: Secondary | ICD-10-CM | POA: Diagnosis not present

## 2022-08-11 DIAGNOSIS — R238 Other skin changes: Secondary | ICD-10-CM | POA: Diagnosis not present

## 2022-08-31 DIAGNOSIS — Z79899 Other long term (current) drug therapy: Secondary | ICD-10-CM | POA: Diagnosis not present

## 2022-08-31 DIAGNOSIS — E785 Hyperlipidemia, unspecified: Secondary | ICD-10-CM | POA: Diagnosis not present

## 2022-08-31 DIAGNOSIS — N289 Disorder of kidney and ureter, unspecified: Secondary | ICD-10-CM | POA: Diagnosis not present

## 2022-10-07 DIAGNOSIS — L84 Corns and callosities: Secondary | ICD-10-CM | POA: Diagnosis not present

## 2022-10-07 DIAGNOSIS — R238 Other skin changes: Secondary | ICD-10-CM | POA: Diagnosis not present

## 2022-10-07 DIAGNOSIS — M19072 Primary osteoarthritis, left ankle and foot: Secondary | ICD-10-CM | POA: Diagnosis not present

## 2022-10-07 DIAGNOSIS — M19071 Primary osteoarthritis, right ankle and foot: Secondary | ICD-10-CM | POA: Diagnosis not present

## 2022-10-07 DIAGNOSIS — R262 Difficulty in walking, not elsewhere classified: Secondary | ICD-10-CM | POA: Diagnosis not present

## 2022-10-07 DIAGNOSIS — M792 Neuralgia and neuritis, unspecified: Secondary | ICD-10-CM | POA: Diagnosis not present

## 2022-10-18 DIAGNOSIS — M5416 Radiculopathy, lumbar region: Secondary | ICD-10-CM | POA: Diagnosis not present

## 2022-10-26 ENCOUNTER — Other Ambulatory Visit: Payer: Self-pay | Admitting: Internal Medicine

## 2022-10-26 DIAGNOSIS — I7301 Raynaud's syndrome with gangrene: Secondary | ICD-10-CM

## 2022-10-26 NOTE — Telephone Encounter (Signed)
Last Fill: 05/18/2022  Next Visit: 11/16/2022  Last Visit: 05/18/2022  Dx: Raynaud's disease with gangrene   Current Dose per office note on 05/18/2022: sildenafil 20 mg titrate up to twice daily if tolerated.   Okay to refill Sildenafil?

## 2022-11-03 NOTE — Progress Notes (Deleted)
Office Visit Note  Patient: Danny Hopkins             Date of Birth: [birth year]           MRN: [id number]             PCP: Laurann Montana, MD Referring: Laurann Montana, MD Visit Date: 11/16/2022   Subjective:  No chief complaint on file.   History of Present Illness: Danny Hopkins is a 62 y.o. male here for follow up for Raynaud's with peripheral vascular insufficiency previous ulceration of the right fourth finger currently on amlodipine 5 mg daily and sildenafil 20 mg twice daily.    Previous HPI 05/18/2022 Danny Hopkins is a 62 y.o. male here for follow up for Raynaud's with peripheral vascular insufficiency previous ulceration of the right fourth finger currently on amlodipine 5 mg daily and sildenafil 20 mg twice daily.  He has not had any new pitting or ulcerating changes noted on his fingers.  Still gets purple discoloration very frequently with the cold exposure but always improving again within a couple minutes after rewarming.  Has not had any recurrent problems with that dizziness or lightheadedness with positional change on the current medication doses.   Previous HPI 02/14/22 Danny Hopkins is a 62 y.o. male here for follow up follow up for raynaud's/peripheral vascular insufficiency on amlodipine 5 mg daily. Since our last visit he has done overall well. Seeing increased discoloration in right hand 3rd-5th fingers again since temperatures cooling off. Often feels some tingling and mild discomfort when happening. No new lesions or ulcers, 3rd finger nail is growing abnormally. More recently also started to see changes in distal left 4th and 5th fingers.   Previous HPI 07/20/2021 Danny Hopkins is a 62 y.o. male here for follow up for raynaud's/peripheral vascular insufficiency after increasing amlodipine dose to 10 mg daily. He has some improvement in symptoms no new lesions although he still sees blue discoloration in fingers intermittently. He has  noticed several episodes of lightheadedness when standing and 3 times had to lie back down and wait to recover his symptoms.   Previous HPI 02/15/21 Danny Hopkins is a 62 y.o. male here for follow up for peripheral vascular insufficiency with ulceration of the right 4th finger and was started on amlodipine 5 mg daily after negative workup of systemic vasculitis antibodies. Since our last visit fingers are doing overall better he is noticing increase in blue discoloration of 5th fingers on both hands with the colder weather. His right 3rd finger has developed a few spots with redness in the finger and nail. His follow up in primary care clinic has shown some borderline low BP on the amlodipine.   Previous HPI: 11/02/20 Danny Hopkins is a 62 y.o. male here for evaluation for vasculitis with digital ulcer of right 4th finger. He has felt in overall usual health but had bene noticing cold and numbness changes in his fingertips for months affecting the right 3-5th fingers. He developed a wound on the tip of his 4th finger in June he does not remember a particular injury or maybe a small abrasion starting this. He did not think it was severe but subsequently developed black discoloration of his whole fingertip peeling of the skin. He saw Dr. Mina Marble for evaluation and subsequent upper extremity arteriography with findings of no patent palmar arch and diminished digital artery perfusions with normal proximal vasculature up to the wrist. Otherwise he denies feeling other  significant symptom or medical changes lately. No skin rashes, lesions, no discoloration or ulceration of other digits or in the past. He has chronic lumbar spine degenerative arthritis with radiculopathy. He has chronic left sided residual weakness due to previous stroke. Workup for the stroke reportedly without any specific secondary underlying cause identified.   Labs reviewed 09/2020 CBC wnl BMP eGFR 48 UA contaminated, +Ca  Oxalate, +glucose   Imaging reviewed 10/09/20 RUE arteriogram IMPRESSION:  Status post ultrasound guided access right common femoral artery for right upper extremity angiogram.  Findings reveal no significant atherosclerotic changes, with no evidence to suggest atheroembolic phenomenon as the source of the fourth digit ulcer, as the arteries are of normal course caliber and contour to the wrist.  The angiographic findings of the right hand are most likely a manifestation of either Buerger's disease (thrombo angiitis obliterans) or other connective tissue disorder/rheumatologic disorder. There was a mild improvement with administration of  nitroglycerin/vasodilators.   10/05/20 LE Vascular ultrasound IMPRESSION: 1. Normal bilateral resting ankle-brachial indices. 2. Markedly blunted digital waveforms bilaterally suggests small vessels disease.   No Rheumatology ROS completed.   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) 02/15/2021   Therapeutic opioid induced constipation 01/21/2021   Rectal bleeding 01/21/2021   Internal hemorrhoids 01/21/2021   Vasculitis (HCC) 11/02/2020   Lumbar radiculopathy 09/17/2020   Allergic rhinitis 08/23/2020   Acute dysfunction of right eustachian tube 08/23/2020   Dysphagia 06/10/2020   Sore in nose 05/21/2020   Weight loss 05/19/2020   Left arm pain 04/19/2020   Migraines 01/07/2020   Depression 08/23/2019   Spasticity 05/29/2019   Vitamin D deficiency 08/25/2018   Laceration of left hand 09/27/2017   Acute pharyngitis 07/14/2017   Chronic tonsillitis 07/12/2017   Chronic pain syndrome 06/22/2017   Bradycardia 06/22/2017   Abnormal ECG  06/22/2017   Former smoker 06/21/2016   Scalp pain 07/30/2015   Elevated blood-pressure reading, without diagnosis of hypertension 06/24/2015   Dandruff 06/24/2015   Lymphadenitis 06/24/2015   Burning with urination 01/25/2015   Balanitis 01/25/2015   Sleeping difficulties 02/04/2014   Bladder neck obstruction 02/04/2014   Hyperlipidemia 02/04/2014   Anterior spinal artery compression syndrome 10/10/2013   Other malaise and fatigue 07/24/2013   Stroke (HCC) 07/24/2013   Encounter for well adult exam with abnormal findings 07/24/2013   Neurologic gait disorder 07/24/2013   Other intervertebral disc displacement, lumbar region 05/29/2013   Cervical spondylosis with myelopathy 01/30/2013   Insomnia 10/12/2012   Generalized anxiety disorder 10/12/2012   Eczema 10/12/2012   History of stroke 10/12/2012   Corn or callus 07/07/2010   ED (erectile dysfunction) of organic origin 07/07/2010   Neurogenic bladder 06/10/2010   Unspecified urinary incontinence 06/10/2010   Hemiplegia (HCC) 06/09/2010    Past Medical History:  Diagnosis Date   Anxiety    07/2019   Arthritis    Depression 08/23/2019   History of chicken pox    Hyperlipidemia    2015   Neurologic gait disorder 07/24/2013   Chronic post stroke   Stroke [facility])    2006    Family History  Problem Relation Age of Onset   Healthy Sister    Healthy Sister  Healthy Brother    Healthy Brother    Stomach cancer Neg Hx    Rectal cancer Neg Hx    Pancreatic cancer Neg Hx    Colon cancer Neg Hx    Colon polyps Neg Hx    Esophageal cancer Neg Hx    Past Surgical History:  Procedure Laterality Date   cervical staph infection     2006   COLONOSCOPY     2015   INGUINAL HERNIA REPAIR     kindergarten   IR ANGIOGRAM EXTREMITY RIGHT  10/09/2020   IR INTRAVASCULAR ULTRASOUND NON CORONARY  10/13/2020   IR RADIOLOGIST EVAL & MGMT  09/24/2020   IR RADIOLOGIST EVAL & MGMT  10/27/2020   IR US GUIDE VASC ACCESS RIGHT  10/09/2020    Social History   Social History Narrative   Regular exercise-no   Caffeine Use-no   Right handed 12/25/20 report he is not able to write his name well at all now   Immunization History  Administered Date(s) Administered   Influenza Inj Mdck Quad Pf 12/09/2017   Influenza Split 01/05/2021   Influenza,inj,Quad PF,6+ Mos 02/04/2014, 01/20/2015, 12/23/2015, 12/13/2016, 12/10/2018, 12/26/2020   Influenza-Unspecified 03/29/2011, 02/02/2012, 03/28/2018, 12/30/2019   PFIZER Comirnaty(Gray Top)Covid-19 Tri-Sucrose Vaccine 09/11/2020   PFIZER(Purple Top)SARS-COV-2 Vaccination 06/25/2019, 07/16/2019, 03/11/2020, 09/11/2020   Td 12/23/2015   Tdap 03/28/2009, 09/20/2017, 10/29/2018   Zoster Recombinant(Shingrix) 10/29/2018, 04/21/2019   Zoster, Live 10/29/2018, 04/21/2019     Objective: Vital Signs: There were no vitals taken for this visit.   Physical Exam   Musculoskeletal Exam: ***  CDAI Exam: CDAI Score: -- Patient Global: --; Provider Global: -- Swollen: --; Tender: -- Joint Exam 11/16/2022   No joint exam has been documented for this visit   There is currently no information documented on the homunculus. Go to the Rheumatology activity and complete the homunculus joint exam.  Investigation: No additional findings.  Imaging: No results found.  Recent Labs: Lab Results  Component Value Date   WBC 3.9 (L) 10/09/2020   HGB 14.9 10/09/2020   PLT 237 10/09/2020   NA 135 10/09/2020   K 4.2 10/09/2020   CL 104 10/09/2020   CO2 22 10/09/2020   GLUCOSE 92 10/09/2020   BUN 18 10/09/2020   CREATININE 1.64 (H) 10/09/2020   BILITOT 1.0 05/19/2020   ALKPHOS 74 05/19/2020   AST 14 05/19/2020   ALT 14 05/19/2020   PROT 6.5 11/02/2020   ALBUMIN 4.2 05/19/2020   CALCIUM 9.2 10/09/2020    Speciality Comments: No specialty comments available.  Procedures:  No procedures performed Allergies: Oatmeal   Assessment / Plan:     Visit Diagnoses: No diagnosis  found.  ***  Orders: No orders of the defined types were placed in this encounter.  No orders of the defined types were placed in this encounter.    Follow-Up Instructions: No follow-ups on file.   Metta Clines, RT  Note - This record has been created using [employer].  Chart creation errors have been sought, but may not always  have been located. Such creation errors do not reflect on  the standard of medical care.

## 2022-11-16 ENCOUNTER — Ambulatory Visit: Payer: 59 | Admitting: Internal Medicine

## 2022-11-16 DIAGNOSIS — I7301 Raynaud's syndrome with gangrene: Secondary | ICD-10-CM

## 2022-11-16 DIAGNOSIS — I776 Arteritis, unspecified: Secondary | ICD-10-CM

## 2022-11-17 NOTE — Progress Notes (Unsigned)
Office Visit Note  Patient: Danny Hopkins             Date of Birth: 03/15/1961           MRN: [id number]             PCP: Laurann Montana, MD Referring: Laurann Montana, MD Visit Date: 11/22/2022   Subjective:  No chief complaint on file.   History of Present Illness: Danny Hopkins is a 62 y.o. male here for follow up for Raynaud's with peripheral vascular insufficiency previous ulceration of the right fourth finger currently on amlodipine 5 mg daily and sildenafil 20 mg twice daily.    Previous HPI 05/18/2022 Danny Hopkins is a 63 y.o. male here for follow up for Raynaud's with peripheral vascular insufficiency previous ulceration of the right fourth finger currently on amlodipine 5 mg daily and sildenafil 20 mg twice daily.  He has not had any new pitting or ulcerating changes noted on his fingers.  Still gets purple discoloration very frequently with the cold exposure but always improving again within a couple minutes after rewarming.  Has not had any recurrent problems with that dizziness or lightheadedness with positional change on the current medication doses.   Previous HPI 02/14/22 Danny Hopkins is a 62 y.o. male here for follow up follow up for raynaud's/peripheral vascular insufficiency on amlodipine 5 mg daily. Since our last visit he has done overall well. Seeing increased discoloration in right hand 3rd-5th fingers again since temperatures cooling off. Often feels some tingling and mild discomfort when happening. No new lesions or ulcers, 3rd finger nail is growing abnormally. More recently also started to see changes in distal left 4th and 5th fingers.   Previous HPI 07/20/2021 Danny Hopkins is a 62 y.o. male here for follow up for raynaud's/peripheral vascular insufficiency after increasing amlodipine dose to 10 mg daily. He has some improvement in symptoms no new lesions although he still sees blue discoloration in fingers intermittently. He has  noticed several episodes of lightheadedness when standing and 3 times had to lie back down and wait to recover his symptoms.   Previous HPI 02/15/21 Danny Hopkins is a 62 y.o. male here for follow up for peripheral vascular insufficiency with ulceration of the right 4th finger and was started on amlodipine 5 mg daily after negative workup of systemic vasculitis antibodies. Since our last visit fingers are doing overall better he is noticing increase in blue discoloration of 5th fingers on both hands with the colder weather. His right 3rd finger has developed a few spots with redness in the finger and nail. His follow up in primary care clinic has shown some borderline low BP on the amlodipine.   Previous HPI: 11/02/20 Danny Hopkins is a 62 y.o. male here for evaluation for vasculitis with digital ulcer of right 4th finger. He has felt in overall usual health but had bene noticing cold and numbness changes in his fingertips for months affecting the right 3-5th fingers. He developed a wound on the tip of his 4th finger in June he does not remember a particular injury or maybe a small abrasion starting this. He did not think it was severe but subsequently developed black discoloration of his whole fingertip peeling of the skin. He saw Dr. Mina Marble for evaluation and subsequent upper extremity arteriography with findings of no patent palmar arch and diminished digital artery perfusions with normal proximal vasculature up to the wrist. Otherwise he denies feeling other  significant symptom or medical changes lately. No skin rashes, lesions, no discoloration or ulceration of other digits or in the past. He has chronic lumbar spine degenerative arthritis with radiculopathy. He has chronic left sided residual weakness due to previous stroke. Workup for the stroke reportedly without any specific secondary underlying cause identified.   Labs reviewed 09/2020 CBC wnl BMP eGFR 48 UA contaminated, +Ca  Oxalate, +glucose   Imaging reviewed 10/09/20 RUE arteriogram IMPRESSION:  Status post ultrasound guided access right common femoral artery for right upper extremity angiogram.  Findings reveal no significant atherosclerotic changes, with no evidence to suggest atheroembolic phenomenon as the source of the fourth digit ulcer, as the arteries are of normal course caliber and contour to the wrist.  The angiographic findings of the right hand are most likely a manifestation of either Buerger's disease (thrombo angiitis obliterans) or other connective tissue disorder/rheumatologic disorder. There was a mild improvement with administration of  nitroglycerin/vasodilators.   10/05/20 LE Vascular ultrasound IMPRESSION: 1. Normal bilateral resting ankle-brachial indices. 2. Markedly blunted digital waveforms bilaterally suggests small vessels disease.   No Rheumatology ROS completed.   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) 02/15/2021   Therapeutic opioid induced constipation 01/21/2021   Rectal bleeding 01/21/2021   Internal hemorrhoids 01/21/2021   Vasculitis (HCC) 11/02/2020   Lumbar radiculopathy 09/17/2020   Allergic rhinitis 08/23/2020   Acute dysfunction of right eustachian tube 08/23/2020   Dysphagia 06/10/2020   Sore in nose 05/21/2020   Weight loss 05/19/2020   Left arm pain 04/19/2020   Migraines 01/07/2020   Depression 08/23/2019   Spasticity 05/29/2019   Vitamin D deficiency 08/25/2018   Laceration of left hand 09/27/2017   Acute pharyngitis 07/14/2017   Chronic tonsillitis 07/12/2017   Chronic pain syndrome 06/22/2017   Bradycardia 06/22/2017   Abnormal ECG  06/22/2017   Former smoker 06/21/2016   Scalp pain 07/30/2015   Elevated blood-pressure reading, without diagnosis of hypertension 06/24/2015   Dandruff 06/24/2015   Lymphadenitis 06/24/2015   Burning with urination 01/25/2015   Balanitis 01/25/2015   Sleeping difficulties 02/04/2014   Bladder neck obstruction 02/04/2014   Hyperlipidemia 02/04/2014   Anterior spinal artery compression syndrome 10/10/2013   Other malaise and fatigue 07/24/2013   Stroke (HCC) 07/24/2013   Encounter for well adult exam with abnormal findings 07/24/2013   Neurologic gait disorder 07/24/2013   Other intervertebral disc displacement, lumbar region 05/29/2013   Cervical spondylosis with myelopathy 01/30/2013   Insomnia 10/12/2012   Generalized anxiety disorder 10/12/2012   Eczema 10/12/2012   History of stroke 10/12/2012   Corn or callus 07/07/2010   ED (erectile dysfunction) of organic origin 07/07/2010   Neurogenic bladder 06/10/2010   Unspecified urinary incontinence 06/10/2010   Hemiplegia (HCC) 06/09/2010    Past Medical History:  Diagnosis Date   Anxiety    07/2019   Arthritis    Depression 08/23/2019   History of chicken pox    Hyperlipidemia    2015   Neurologic gait disorder 07/24/2013   Chronic post stroke   Stroke [facility])    2006    Family History  Problem Relation Age of Onset   Healthy Sister    Healthy Sister  Healthy Brother    Healthy Brother    Stomach cancer Neg Hx    Rectal cancer Neg Hx    Pancreatic cancer Neg Hx    Colon cancer Neg Hx    Colon polyps Neg Hx    Esophageal cancer Neg Hx    Past Surgical History:  Procedure Laterality Date   cervical staph infection     2006   COLONOSCOPY     2015   INGUINAL HERNIA REPAIR     kindergarten   IR ANGIOGRAM EXTREMITY RIGHT  10/09/2020   IR INTRAVASCULAR ULTRASOUND NON CORONARY  10/13/2020   IR RADIOLOGIST EVAL & MGMT  09/24/2020   IR RADIOLOGIST EVAL & MGMT  10/27/2020   IR US GUIDE VASC ACCESS RIGHT  10/09/2020    Social History   Social History Narrative   Regular exercise-no   Caffeine Use-no   Right handed 12/25/20 report he is not able to write his name well at all now   Immunization History  Administered Date(s) Administered   Influenza Inj Mdck Quad Pf 12/09/2017   Influenza Split 01/05/2021   Influenza,inj,Quad PF,6+ Mos 02/04/2014, 01/20/2015, 12/23/2015, 12/13/2016, 12/10/2018, 12/26/2020   Influenza-Unspecified 03/29/2011, 02/02/2012, 03/28/2018, 12/30/2019   PFIZER Comirnaty(Gray Top)Covid-19 Tri-Sucrose Vaccine 09/11/2020   PFIZER(Purple Top)SARS-COV-2 Vaccination 06/25/2019, 07/16/2019, 03/11/2020, 09/11/2020   Td 12/23/2015   Tdap 03/28/2009, 09/20/2017, 10/29/2018   Zoster Recombinant(Shingrix) 10/29/2018, 04/21/2019   Zoster, Live 10/29/2018, 04/21/2019     Objective: Vital Signs: There were no vitals taken for this visit.   Physical Exam   Musculoskeletal Exam: ***  CDAI Exam: CDAI Score: -- Patient Global: --; Provider Global: -- Swollen: --; Tender: -- Joint Exam 11/22/2022   No joint exam has been documented for this visit   There is currently no information documented on the homunculus. Go to the Rheumatology activity and complete the homunculus joint exam.  Investigation: No additional findings.  Imaging: No results found.  Recent Labs: Lab Results  Component Value Date   WBC 3.9 (L) 10/09/2020   HGB 14.9 10/09/2020   PLT 237 10/09/2020   NA 135 10/09/2020   K 4.2 10/09/2020   CL 104 10/09/2020   CO2 22 10/09/2020   GLUCOSE 92 10/09/2020   BUN 18 10/09/2020   CREATININE 1.64 (H) 10/09/2020   BILITOT 1.0 05/19/2020   ALKPHOS 74 05/19/2020   AST 14 05/19/2020   ALT 14 05/19/2020   PROT 6.5 11/02/2020   ALBUMIN 4.2 05/19/2020   CALCIUM 9.2 10/09/2020    Speciality Comments: No specialty comments available.  Procedures:  No procedures performed Allergies: Oatmeal   Assessment / Plan:     Visit Diagnoses: No diagnosis  found.  ***  Orders: No orders of the defined types were placed in this encounter.  No orders of the defined types were placed in this encounter.    Follow-Up Instructions: No follow-ups on file.   Metta Clines, RT  Note - This record has been created using [employer].  Chart creation errors have been sought, but may not always  have been located. Such creation errors do not reflect on  the standard of medical care.

## 2022-11-22 ENCOUNTER — Ambulatory Visit: Payer: 59 | Attending: Internal Medicine | Admitting: Internal Medicine

## 2022-11-22 ENCOUNTER — Encounter: Payer: Self-pay | Admitting: Internal Medicine

## 2022-11-22 VITALS — BP 83/52 | HR 64 | Resp 14 | Ht 67.0 in | Wt 141.0 lb

## 2022-11-22 DIAGNOSIS — I776 Arteritis, unspecified: Secondary | ICD-10-CM

## 2022-11-22 DIAGNOSIS — G894 Chronic pain syndrome: Secondary | ICD-10-CM | POA: Diagnosis not present

## 2022-11-22 DIAGNOSIS — I7301 Raynaud's syndrome with gangrene: Secondary | ICD-10-CM | POA: Diagnosis not present

## 2022-11-22 MED ORDER — AMLODIPINE BESYLATE 5 MG PO TABS: 5.0000 mg | ORAL_TABLET | Freq: Every day | ORAL | 1 refills | Status: AC

## 2022-11-22 NOTE — Patient Instructions (Signed)
I recommend continuing the amlodipine 5 mg once daily. You can stop taking the sildenafil 20 mg. This is the medicine you decreased to once daily since our last visit.  If you notice a major increase in finger discoloration or any pitting or scabbing especially in a few months with more cold exposure you should let us know.

## 2022-11-30 DIAGNOSIS — G894 Chronic pain syndrome: Secondary | ICD-10-CM | POA: Diagnosis not present

## 2022-12-06 ENCOUNTER — Emergency Department (HOSPITAL_BASED_OUTPATIENT_CLINIC_OR_DEPARTMENT_OTHER)
Admission: EM | Admit: 2022-12-06 | Discharge: 2022-12-06 | Disposition: A | Discharge status: Home / Self Care | Payer: 59 | Attending: Emergency Medicine | Admitting: Emergency Medicine

## 2022-12-06 ENCOUNTER — Encounter (HOSPITAL_BASED_OUTPATIENT_CLINIC_OR_DEPARTMENT_OTHER): Payer: Self-pay | Admitting: Emergency Medicine

## 2022-12-06 ENCOUNTER — Emergency Department (HOSPITAL_BASED_OUTPATIENT_CLINIC_OR_DEPARTMENT_OTHER): Payer: 59

## 2022-12-06 ENCOUNTER — Other Ambulatory Visit: Payer: Self-pay

## 2022-12-06 DIAGNOSIS — R519 Headache, unspecified: Secondary | ICD-10-CM | POA: Diagnosis not present

## 2022-12-06 DIAGNOSIS — Z8673 Personal history of transient ischemic attack (TIA), and cerebral infarction without residual deficits: Secondary | ICD-10-CM | POA: Insufficient documentation

## 2022-12-06 DIAGNOSIS — G4489 Other headache syndrome: Secondary | ICD-10-CM | POA: Diagnosis not present

## 2022-12-06 DIAGNOSIS — Z743 Need for continuous supervision: Secondary | ICD-10-CM | POA: Diagnosis not present

## 2022-12-06 DIAGNOSIS — R6889 Other general symptoms and signs: Secondary | ICD-10-CM | POA: Diagnosis not present

## 2022-12-06 DIAGNOSIS — I6622 Occlusion and stenosis of left posterior cerebral artery: Secondary | ICD-10-CM | POA: Diagnosis not present

## 2022-12-06 LAB — BASIC METABOLIC PANEL
Anion gap: 8 (ref 5–15)
BUN: 12 mg/dL (ref 6–20)
CO2: 30 mmol/L (ref 22–32)
Calcium: 9.1 mg/dL (ref 8.9–10.3)
Chloride: 102 mmol/L (ref 98–111)
Creatinine, Ser: 1.22 mg/dL (ref 0.61–1.24)
GFR, Estimated: >60 mL/min (ref >60)
Glucose, Bld: 95 mg/dL (ref 70–99)
Potassium: 3.8 mmol/L (ref 3.5–5.1)
Sodium: 140 mmol/L (ref 135–145)

## 2022-12-06 LAB — CBC
HCT: 46.4 % (ref 39.0–52.0)
Hemoglobin: 15.5 g/dL (ref 13.0–17.0)
MCH: 29.9 pg (ref 26.0–34.0)
MCHC: 33.4 g/dL (ref 30.0–36.0)
MCV: 89.4 fL (ref 80.0–100.0)
Platelets: 173 10*3/uL (ref 150–400)
RBC: 5.19 MIL/uL (ref 4.22–5.81)
RDW: 13.5 % (ref 11.5–15.5)
WBC: 6.1 10*3/uL (ref 4.0–10.5)
nRBC: 0 % (ref 0.0–0.2)

## 2022-12-06 MED ORDER — LACTATED RINGERS IV BOLUS
500.0000 mL | Freq: Once | INTRAVENOUS | Status: AC
  Administered 2022-12-06: 500 mL via INTRAVENOUS

## 2022-12-06 MED ORDER — DIPHENHYDRAMINE HCL 50 MG/ML IJ SOLN
25.0000 mg | Freq: Once | INTRAMUSCULAR | Status: AC
  Administered 2022-12-06: 25 mg via INTRAVENOUS
  Filled 2022-12-06: qty 1

## 2022-12-06 MED ORDER — PROCHLORPERAZINE EDISYLATE 10 MG/2ML IJ SOLN
10.0000 mg | Freq: Once | INTRAMUSCULAR | Status: AC
  Administered 2022-12-06: 10 mg via INTRAVENOUS
  Filled 2022-12-06: qty 2

## 2022-12-06 MED ORDER — IOHEXOL 350 MG/ML SOLN
100.0000 mL | Freq: Once | INTRAVENOUS | Status: AC | PRN
  Administered 2022-12-06: 75 mL via INTRAVENOUS

## 2022-12-06 NOTE — ED Notes (Signed)
Pts headache has decreased and the blurriness in the right eye has improved... Provider notified.Marland KitchenMarland Kitchen

## 2022-12-06 NOTE — ED Provider Notes (Incomplete)
[place] EMERGENCY DEPARTMENT AT [facility] Provider Note   CSN: [id number] Arrival date & time: 12/06/22  1537     History {Add pertinent medical, surgical, social history, OB history to HPI:1} Chief Complaint  Patient presents with  . Headache    CRHISTOPHER WALTNER is a 62 y.o. male.  HPI     Home Medications Prior to Admission medications   Medication Sig Start Date End Date Taking? Authorizing Provider  ALPRAZolam Prudy Feeler) 1 MG tablet TAKE 1 TABLET IN THE MORNING AND 1 AND 1/2 TABLETS IN THE EVENING AS NEEDED Patient not taking: Reported on 11/22/2022 02/03/21   Corwin Levins, MD  amLODipine (NORVASC) 5 MG tablet Take 1 tablet (5 mg total) by mouth daily. 11/22/22   Rice, Jamesetta Orleans, MD  atorvastatin (LIPITOR) 20 MG tablet TAKE 1 TABLET BY MOUTH EVERY DAY Patient taking differently: Take 20 mg by mouth daily. 07/16/20   Corwin Levins, MD  baclofen (LIORESAL) 10 MG tablet Take 10 mg by mouth daily as needed (Back spasms). 10/06/19   [provider]  Cholecalciferol (VITAMIN D3) 50 MCG (2000 UT) TABS Take 2,000 Units by mouth daily.    [provider]  clotrimazole-betamethasone (LOTRISONE) cream APPLY TO AFFECTED AREA TWICE A DAY Patient not taking: Reported on 11/22/2022 11/12/20   Corwin Levins, MD  docusate sodium (COLACE) 100 MG capsule Take 200 mg by mouth 2 (two) times daily. Patient not taking: Reported on 11/22/2022    [provider]  fluticasone (FLONASE) 50 MCG/ACT nasal spray Place 2 sprays into both nostrils daily.    [provider]  hydrocortisone (ANUSOL-HC) 2.5 % rectal cream Apply 1 application  topically 2 (two) times daily. Patient not taking: Reported on 11/22/2022 11/13/20   [provider]  MOVANTIK 25 MG TABS tablet Take 1 tablet (25 mg total) by mouth every morning. Patient not taking: Reported on 11/22/2022 01/21/21   Zehr, Shanda Bumps D, PA-C  naproxen (NAPROSYN) 500 MG tablet TAKE 1 TABLET (500 MG  TOTAL) BY MOUTH 2 (TWO) TIMES DAILY AS NEEDED FOR HEADACHE. 05/26/20   Corwin Levins, MD  omeprazole (PRILOSEC) 20 MG capsule TAKE 1 CAPSULE BY MOUTH EVERY DAY AS NEEDED 03/01/21   Corwin Levins, MD  oxyCODONE-acetaminophen (PERCOCET) 10-325 MG tablet Take 1 tablet by mouth every 6 (six) hours as needed.    [provider]  oxyCODONE-acetaminophen (PERCOCET) 7.5-325 MG tablet take 1 tablet by oral route  every 6 hours as needed DNF (07/02/21) Patient not taking: Reported on 11/22/2022 06/29/21   [provider]  sertraline (ZOLOFT) 50 MG tablet Take 50 mg by mouth daily. Patient not taking: Reported on 11/22/2022 06/28/21   [provider]  sulfamethoxazole-trimethoprim (BACTRIM DS) 800-160 MG tablet Take 1 tablet by mouth 2 (two) times daily. Patient not taking: Reported on 11/22/2022 05/13/22   [provider]  SUMAtriptan (IMITREX) 100 MG tablet Take 1 tablet (100 mg total) by mouth every 2 (two) hours as needed for migraine or headache. May repeat in 2 hours if headache persists or recurs. Patient not taking: Reported on 11/22/2022 01/07/20   Corwin Levins, MD  tamsulosin [facility]) 0.4 MG CAPS capsule TAKE 1 CAPSULE BY MOUTH EVERY DAY Patient not taking: Reported on 11/22/2022 08/11/20   Corwin Levins, MD      Allergies    Oatmeal    Review of Systems   Review of Systems  Physical Exam Updated Vital Signs BP (!) 147/77 (BP  Location: Left Arm)   Pulse (!) 56   Temp 97.8 F (36.6 C) (Temporal)   Resp 15   Ht 5\' 7"  (1.702 m)   Wt 65.8 kg   SpO2 100%   BMI 22.71 kg/m  Physical Exam  ED Results / Procedures / Treatments   Labs (all labs ordered are listed, but only abnormal results are displayed) Labs Reviewed  CBC  BASIC METABOLIC PANEL    EKG None  Radiology No results found.  Procedures Procedures  {Document cardiac monitor, telemetry assessment procedure when appropriate:1}  Medications Ordered in ED Medications - No data to display  ED  Course/ Medical Decision Making/ A&P   {   Click here for ABCD2, HEART and other calculatorsREFRESH Note before signing :1}                              Medical Decision Making Amount and/or Complexity of Data Reviewed Labs: ordered.   ***  {Document critical care time when appropriate:1} {Document review of labs and clinical decision tools ie heart score, Chads2Vasc2 etc:1}  {Document your independent review of radiology images, and any outside records:1} {Document your discussion with family members, caretakers, and with consultants:1} {Document social determinants of health affecting pt's care:1} {Document your decision making why or why not admission, treatments were needed:1} Final Clinical Impression(s) / ED Diagnoses Final diagnoses:  None    Rx / DC Orders ED Discharge Orders     None

## 2022-12-06 NOTE — ED Notes (Signed)
Discharged by another RN.

## 2022-12-06 NOTE — ED Provider Notes (Signed)
[place] EMERGENCY DEPARTMENT AT [facility] Provider Note   CSN: [id number] Arrival date & time: 12/06/22  1537     History {Add pertinent medical, surgical, social history, OB history to HPI:1} Chief Complaint  Patient presents with   Headache    Danny Hopkins is a 62 y.o. male.  HPI      62yo male with history of hyperlipidemia, CVA, depression   Presenting with headache that started this morning, hurting mainly on the right side, sharp pain, easing up some now but still there.  Slowly worsened over the course of an hour.  10AM is when it started today.  Had eyes closed most of the time because it was worse when they were open.  No numbness, weakness, change in vision, trouble walking or talking. No fevers.  No nausea or vomiting.  No family history of aneurysm or personal hx of aneurysm.  No prior hx of headaches. No falls or trauma.  No medicine changes.      Past Medical History:  Diagnosis Date   Anxiety    07/2019   Arthritis    Depression 08/23/2019   History of chicken pox    Hyperlipidemia    2015   Neurologic gait disorder 07/24/2013   Chronic post stroke   Stroke [facility])    2006    Home Medications Prior to Admission medications   Medication Sig Start Date End Date Taking? Authorizing Provider  ALPRAZolam Prudy Feeler) 1 MG tablet TAKE 1 TABLET IN THE MORNING AND 1 AND 1/2 TABLETS IN THE EVENING AS NEEDED Patient not taking: Reported on 11/22/2022 02/03/21   Corwin Levins, MD  amLODipine (NORVASC) 5 MG tablet Take 1 tablet (5 mg total) by mouth daily. 11/22/22   Rice, Jamesetta Orleans, MD  atorvastatin (LIPITOR) 20 MG tablet TAKE 1 TABLET BY MOUTH EVERY DAY Patient taking differently: Take 20 mg by mouth daily. 07/16/20   Corwin Levins, MD  baclofen (LIORESAL) 10 MG tablet Take 10 mg by mouth daily as needed (Back spasms). 10/06/19   [provider]  Cholecalciferol (VITAMIN D3) 50 MCG (2000 UT) TABS Take 2,000 Units by mouth daily.     [provider]  clotrimazole-betamethasone (LOTRISONE) cream APPLY TO AFFECTED AREA TWICE A DAY Patient not taking: Reported on 11/22/2022 11/12/20   Corwin Levins, MD  docusate sodium (COLACE) 100 MG capsule Take 200 mg by mouth 2 (two) times daily. Patient not taking: Reported on 11/22/2022    [provider]  fluticasone (FLONASE) 50 MCG/ACT nasal spray Place 2 sprays into both nostrils daily.    [provider]  hydrocortisone (ANUSOL-HC) 2.5 % rectal cream Apply 1 application  topically 2 (two) times daily. Patient not taking: Reported on 11/22/2022 11/13/20   [provider]  MOVANTIK 25 MG TABS tablet Take 1 tablet (25 mg total) by mouth every morning. Patient not taking: Reported on 11/22/2022 01/21/21   Zehr, Shanda Bumps D, PA-C  naproxen (NAPROSYN) 500 MG tablet TAKE 1 TABLET (500 MG TOTAL) BY MOUTH 2 (TWO) TIMES DAILY AS NEEDED FOR HEADACHE. 05/26/20   Corwin Levins, MD  omeprazole (PRILOSEC) 20 MG capsule TAKE 1 CAPSULE BY MOUTH EVERY DAY AS NEEDED 03/01/21   Corwin Levins, MD  oxyCODONE-acetaminophen (PERCOCET) 10-325 MG tablet Take 1 tablet by mouth every 6 (six) hours as needed.    [provider]  oxyCODONE-acetaminophen (PERCOCET) 7.5-325 MG tablet take 1 tablet by oral route  every 6 hours as needed DNF (  07/02/21) Patient not taking: Reported on 11/22/2022 06/29/21   [provider]  sertraline (ZOLOFT) 50 MG tablet Take 50 mg by mouth daily. Patient not taking: Reported on 11/22/2022 06/28/21   [provider]  sulfamethoxazole-trimethoprim (BACTRIM DS) 800-160 MG tablet Take 1 tablet by mouth 2 (two) times daily. Patient not taking: Reported on 11/22/2022 05/13/22   [provider]  SUMAtriptan (IMITREX) 100 MG tablet Take 1 tablet (100 mg total) by mouth every 2 (two) hours as needed for migraine or headache. May repeat in 2 hours if headache persists or recurs. Patient not taking: Reported on 11/22/2022 01/07/20   Corwin Levins, MD  tamsulosin [facility]) 0.4 MG CAPS capsule TAKE 1 CAPSULE BY MOUTH EVERY DAY Patient not taking: Reported on 11/22/2022 08/11/20   Corwin Levins, MD      Allergies    Oatmeal    Review of Systems   Review of Systems  Physical Exam Updated Vital Signs BP (!) 147/77 (BP Location: Left Arm)   Pulse (!) 56   Temp 97.8 F (36.6 C) (Temporal)   Resp 15   Ht 5\' 7"  (1.702 m)   Wt 65.8 kg   SpO2 100%   BMI 22.71 kg/m  Physical Exam  ED Results / Procedures / Treatments   Labs (all labs ordered are listed, but only abnormal results are displayed) Labs Reviewed  CBC  BASIC METABOLIC PANEL    EKG None  Radiology No results found.  Procedures Procedures  {Document cardiac monitor, telemetry assessment procedure when appropriate:1}  Medications Ordered in ED Medications - No data to display  ED Course/ Medical Decision Making/ A&P   {   Click here for ABCD2, HEART and other calculatorsREFRESH Note before signing :1}                              Medical Decision Making Amount and/or Complexity of Data Reviewed Labs: ordered.   ***  {Document critical care time when appropriate:1} {Document review of labs and clinical decision tools ie heart score, Chads2Vasc2 etc:1}  {Document your independent review of radiology images, and any outside records:1} {Document your discussion with family members, caretakers, and with consultants:1} {Document social determinants of health affecting pt's care:1} {Document your decision making why or why not admission, treatments were needed:1} Final Clinical Impression(s) / ED Diagnoses Final diagnoses:  None    Rx / DC Orders ED Discharge Orders     None

## 2022-12-06 NOTE — ED Triage Notes (Signed)
Pt arrives to ED via [facility] with c/o right sided headache that started this morning.

## 2022-12-15 DIAGNOSIS — H5212 Myopia, left eye: Secondary | ICD-10-CM | POA: Diagnosis not present

## 2022-12-22 DIAGNOSIS — E785 Hyperlipidemia, unspecified: Secondary | ICD-10-CM | POA: Diagnosis not present

## 2022-12-22 DIAGNOSIS — Z23 Encounter for immunization: Secondary | ICD-10-CM | POA: Diagnosis not present

## 2022-12-22 DIAGNOSIS — M5136 Other intervertebral disc degeneration, lumbar region: Secondary | ICD-10-CM | POA: Diagnosis not present

## 2022-12-22 DIAGNOSIS — I69354 Hemiplegia and hemiparesis following cerebral infarction affecting left non-dominant side: Secondary | ICD-10-CM | POA: Diagnosis not present

## 2022-12-22 DIAGNOSIS — J309 Allergic rhinitis, unspecified: Secondary | ICD-10-CM | POA: Diagnosis not present

## 2022-12-22 DIAGNOSIS — Z9989 Dependence on other enabling machines and devices: Secondary | ICD-10-CM | POA: Diagnosis not present

## 2022-12-22 DIAGNOSIS — Z0000 Encounter for general adult medical examination without abnormal findings: Secondary | ICD-10-CM | POA: Diagnosis not present

## 2022-12-22 DIAGNOSIS — I776 Arteritis, unspecified: Secondary | ICD-10-CM | POA: Diagnosis not present

## 2022-12-22 DIAGNOSIS — I7300 Raynaud's syndrome without gangrene: Secondary | ICD-10-CM | POA: Diagnosis not present

## 2022-12-30 ENCOUNTER — Other Ambulatory Visit: Payer: Self-pay | Admitting: Internal Medicine

## 2022-12-30 DIAGNOSIS — I7301 Raynaud's syndrome with gangrene: Secondary | ICD-10-CM

## 2023-01-09 DIAGNOSIS — R262 Difficulty in walking, not elsewhere classified: Secondary | ICD-10-CM | POA: Diagnosis not present

## 2023-01-09 DIAGNOSIS — R238 Other skin changes: Secondary | ICD-10-CM | POA: Diagnosis not present

## 2023-01-09 DIAGNOSIS — L84 Corns and callosities: Secondary | ICD-10-CM | POA: Diagnosis not present

## 2023-01-16 DIAGNOSIS — M6281 Muscle weakness (generalized): Secondary | ICD-10-CM | POA: Diagnosis not present

## 2023-01-16 DIAGNOSIS — M5451 Vertebrogenic low back pain: Secondary | ICD-10-CM | POA: Diagnosis not present

## 2023-01-16 DIAGNOSIS — M2569 Stiffness of other specified joint, not elsewhere classified: Secondary | ICD-10-CM | POA: Diagnosis not present

## 2023-01-16 DIAGNOSIS — R293 Abnormal posture: Secondary | ICD-10-CM | POA: Diagnosis not present

## 2023-01-25 ENCOUNTER — Telehealth: Payer: Self-pay

## 2023-01-25 NOTE — Telephone Encounter (Signed)
Received a fax from [facility] pharmacy in [place] that the patient has changed his pharmacy to them. Added the pharmacy to the patients pharmacy list. Pharmacy requested a refill of Sildenafil. After reviewing the chart, Dr. Dimple Casey discontinued the prescription on 11/22/2022. Wrote a note on the paper requesting the refill and advised prescription was discontinued and faxed back to the pharmacy.

## 2023-01-27 DIAGNOSIS — R292 Abnormal reflex: Secondary | ICD-10-CM | POA: Diagnosis not present

## 2023-01-27 DIAGNOSIS — Z59819 Housing instability, housed unspecified: Secondary | ICD-10-CM | POA: Diagnosis not present

## 2023-01-27 DIAGNOSIS — R29898 Other symptoms and signs involving the musculoskeletal system: Secondary | ICD-10-CM | POA: Diagnosis not present

## 2023-01-27 DIAGNOSIS — I9589 Other hypotension: Secondary | ICD-10-CM | POA: Diagnosis not present

## 2023-01-27 DIAGNOSIS — Z9989 Dependence on other enabling machines and devices: Secondary | ICD-10-CM | POA: Diagnosis not present

## 2023-01-30 ENCOUNTER — Other Ambulatory Visit: Payer: Self-pay | Admitting: Physician Assistant

## 2023-01-30 DIAGNOSIS — R292 Abnormal reflex: Secondary | ICD-10-CM

## 2023-01-30 DIAGNOSIS — R29898 Other symptoms and signs involving the musculoskeletal system: Secondary | ICD-10-CM

## 2023-02-08 ENCOUNTER — Other Ambulatory Visit: Payer: 59

## 2023-03-09 DIAGNOSIS — E785 Hyperlipidemia, unspecified: Secondary | ICD-10-CM | POA: Diagnosis not present

## 2023-03-09 DIAGNOSIS — M545 Low back pain, unspecified: Secondary | ICD-10-CM | POA: Diagnosis not present

## 2023-03-09 DIAGNOSIS — I10 Essential (primary) hypertension: Secondary | ICD-10-CM | POA: Diagnosis not present

## 2023-05-09 NOTE — Progress Notes (Deleted)
 Office Visit Note  Patient: Danny Hopkins             Date of Birth: August 27, 1960           MRN: [id number]             PCP: Laurann Montana, MD Referring: Laurann Montana, MD Visit Date: 05/23/2023   Subjective:  No chief complaint on file.   History of Present Illness: Danny Hopkins is a 63 y.o. male here for follow up for Raynaud's with peripheral vascular insufficiency previous ulceration of the right fourth finger currently on amlodipine 5 mg daily.    Previous HPI 11/22/2022 Danny Hopkins is a 63 y.o. male here for follow up for Raynaud's with peripheral vascular insufficiency previous ulceration of the right fourth finger currently on amlodipine 5 mg daily.  Since our last visit we decreased sildenafil down to 20 mg once daily since symptoms were pretty well-controlled with only mild discoloration and no new lesions and getting occasional positional dizziness.  Since decreasing the medication has not noticed any major worsening of symptoms.  He still gets lightheaded occasionally not as frequent as when on higher medication dose.  No change in leg swelling.  Otherwise has not noted any particular interval infection or required antibiotics.   Previous HPI 05/18/2022 Danny Hopkins is a 63 y.o. male here for follow up for Raynaud's with peripheral vascular insufficiency previous ulceration of the right fourth finger currently on amlodipine 5 mg daily and sildenafil 20 mg twice daily.  He has not had any new pitting or ulcerating changes noted on his fingers.  Still gets purple discoloration very frequently with the cold exposure but always improving again within a couple minutes after rewarming.  Has not had any recurrent problems with that dizziness or lightheadedness with positional change on the current medication doses.   Previous HPI: 11/02/20 Danny Hopkins is a 63 y.o. male here for evaluation for vasculitis with digital ulcer of right 4th finger. He has felt  in overall usual health but had bene noticing cold and numbness changes in his fingertips for months affecting the right 3-5th fingers. He developed a wound on the tip of his 4th finger in June he does not remember a particular injury or maybe a small abrasion starting this. He did not think it was severe but subsequently developed black discoloration of his whole fingertip peeling of the skin. He saw Dr. Mina Marble for evaluation and subsequent upper extremity arteriography with findings of no patent palmar arch and diminished digital artery perfusions with normal proximal vasculature up to the wrist. Otherwise he denies feeling other significant symptom or medical changes lately. No skin rashes, lesions, no discoloration or ulceration of other digits or in the past. He has chronic lumbar spine degenerative arthritis with radiculopathy. He has chronic left sided residual weakness due to previous stroke. Workup for the stroke reportedly without any specific secondary underlying cause identified.   Labs reviewed 09/2020 CBC wnl BMP eGFR 48 UA contaminated, +Ca Oxalate, +glucose   Imaging reviewed 10/09/20 RUE arteriogram IMPRESSION:  Status post ultrasound guided access right common femoral artery for right upper extremity angiogram.  Findings reveal no significant atherosclerotic changes, with no evidence to suggest atheroembolic phenomenon as the source of the fourth digit ulcer, as the arteries are of normal course caliber and contour to the wrist.  The angiographic findings of the right hand are most likely a manifestation of either Buerger's disease (thrombo angiitis obliterans) or  other connective tissue disorder/rheumatologic disorder. There was a mild improvement with administration of  nitroglycerin/vasodilators.   10/05/20 LE Vascular ultrasound IMPRESSION: 1. Normal bilateral resting ankle-brachial indices. 2. Markedly blunted digital waveforms bilaterally suggests small vessels  disease.   No Rheumatology ROS completed.   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) 02/15/2021   Therapeutic opioid induced constipation 01/21/2021   Rectal bleeding 01/21/2021   Internal hemorrhoids 01/21/2021   Vasculitis (HCC) 11/02/2020   Lumbar radiculopathy 09/17/2020   Allergic rhinitis 08/23/2020   Acute dysfunction of right eustachian tube 08/23/2020   Dysphagia 06/10/2020   Sore in nose 05/21/2020   Weight loss 05/19/2020   Left arm pain 04/19/2020   Migraines 01/07/2020   Depression 08/23/2019   Spasticity 05/29/2019   Vitamin D deficiency 08/25/2018   Laceration of left hand 09/27/2017   Acute pharyngitis 07/14/2017   Chronic tonsillitis 07/12/2017   Chronic pain syndrome 06/22/2017   Bradycardia 06/22/2017   Abnormal ECG 06/22/2017   Former smoker 06/21/2016   Scalp pain 07/30/2015   Elevated blood-pressure reading, without diagnosis of hypertension 06/24/2015   Dandruff 06/24/2015   Lymphadenitis 06/24/2015   Burning with urination 01/25/2015   Balanitis 01/25/2015   Sleeping difficulties 02/04/2014   Bladder neck obstruction 02/04/2014   Hyperlipidemia 02/04/2014   Anterior spinal artery compression syndrome 10/10/2013   Other malaise and fatigue 07/24/2013   Stroke (HCC) 07/24/2013   Encounter for well adult exam with abnormal findings 07/24/2013   Neurologic gait disorder 07/24/2013   Other intervertebral disc displacement, lumbar region 05/29/2013   Cervical spondylosis with myelopathy 01/30/2013   Insomnia 10/12/2012   Generalized anxiety disorder 10/12/2012   Eczema 10/12/2012   History of stroke 10/12/2012   Corn or callus 07/07/2010    ED (erectile dysfunction) of organic origin 07/07/2010   Neurogenic bladder 06/10/2010   Unspecified urinary incontinence 06/10/2010   Hemiplegia (HCC) 06/09/2010    Past Medical History:  Diagnosis Date   Anxiety    07/2019   Arthritis    Depression 08/23/2019   History of chicken pox    Hyperlipidemia    2015   Neurologic gait disorder 07/24/2013   Chronic post stroke   Stroke [facility])    2006    Family History  Problem Relation Age of Onset   Healthy Sister    Healthy Sister    Healthy Brother    Healthy Brother    Stomach cancer Neg Hx    Rectal cancer Neg Hx    Pancreatic cancer Neg Hx    Colon cancer Neg Hx    Colon polyps Neg Hx    Esophageal cancer Neg Hx    Past Surgical History:  Procedure Laterality Date   cervical staph infection     2006   COLONOSCOPY     2015   INGUINAL HERNIA REPAIR     kindergarten   IR ANGIOGRAM EXTREMITY RIGHT  10/09/2020   IR INTRAVASCULAR ULTRASOUND NON CORONARY  10/13/2020   IR RADIOLOGIST EVAL & MGMT  09/24/2020   IR RADIOLOGIST EVAL & MGMT  10/27/2020   IR US GUIDE VASC ACCESS RIGHT  10/09/2020   Social History   Social History Narrative   Regular exercise-no  Caffeine Use-no   Right handed 12/25/20 report he is not able to write his name well at all now   Immunization History  Administered Date(s) Administered   Influenza Inj Mdck Quad Pf 12/09/2017   Influenza Split 01/05/2021   Influenza,inj,Quad PF,6+ Mos 02/04/2014, 01/20/2015, 12/23/2015, 12/13/2016, 12/10/2018, 12/26/2020   Influenza-Unspecified 03/29/2011, 02/02/2012, 03/28/2018, 12/30/2019   PFIZER Comirnaty(Gray Top)Covid-19 Tri-Sucrose Vaccine 09/11/2020   PFIZER(Purple Top)SARS-COV-2 Vaccination 06/25/2019, 07/16/2019, 03/11/2020, 09/11/2020   Td 12/23/2015   Tdap 03/28/2009, 09/20/2017, 10/29/2018   Zoster Recombinant(Shingrix) 10/29/2018, 04/21/2019   Zoster, Live 10/29/2018, 04/21/2019     Objective: Vital Signs: There were no vitals taken for this visit.    Physical Exam   Musculoskeletal Exam: ***  CDAI Exam: CDAI Score: -- Patient Global: --; Provider Global: -- Swollen: --; Tender: -- Joint Exam 05/23/2023   No joint exam has been documented for this visit   There is currently no information documented on the homunculus. Go to the Rheumatology activity and complete the homunculus joint exam.  Investigation: No additional findings.  Imaging: No results found.  Recent Labs: Lab Results  Component Value Date   WBC 6.1 12/06/2022   HGB 15.5 12/06/2022   PLT 173 12/06/2022   NA 140 12/06/2022   K 3.8 12/06/2022   CL 102 12/06/2022   CO2 30 12/06/2022   GLUCOSE 95 12/06/2022   BUN 12 12/06/2022   CREATININE 1.22 12/06/2022   BILITOT 1.0 05/19/2020   ALKPHOS 74 05/19/2020   AST 14 05/19/2020   ALT 14 05/19/2020   PROT 6.5 11/02/2020   ALBUMIN 4.2 05/19/2020   CALCIUM 9.1 12/06/2022    Speciality Comments: No specialty comments available.  Procedures:  No procedures performed Allergies: Oatmeal   Assessment / Plan:     Visit Diagnoses: No diagnosis found.  ***  Orders: No orders of the defined types were placed in this encounter.  No orders of the defined types were placed in this encounter.    Follow-Up Instructions: No follow-ups on file.   Metta Clines, RT  Note - This record has been created using [employer].  Chart creation errors have been sought, but may not always  have been located. Such creation errors do not reflect on  the standard of medical care.

## 2023-05-23 ENCOUNTER — Ambulatory Visit: Payer: 59 | Admitting: Internal Medicine

## 2023-05-23 DIAGNOSIS — I7301 Raynaud's syndrome with gangrene: Secondary | ICD-10-CM

## 2023-05-23 DIAGNOSIS — I776 Arteritis, unspecified: Secondary | ICD-10-CM

## 2023-05-23 DIAGNOSIS — G894 Chronic pain syndrome: Secondary | ICD-10-CM

## 2023-05-28 IMAGING — XA IR ANGIO/EXT/UNI*R*
8 series · 12 of 24 positions shown · IV contrast (IODINE)
Comparison: none

INDICATION: 60-year-old male with a history of right fourth finger digital ulcer

[Series 1: extr.4 care · 4 acquisitions, 1 frame shown (1 of 8)]
[im 1/4]
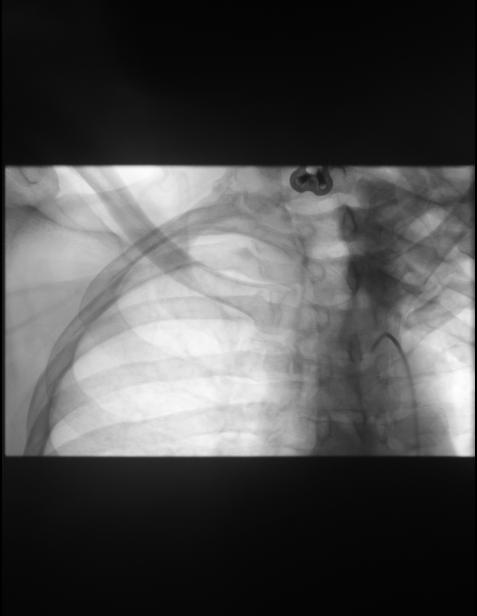

[Series 2: extr.4 care · 4 acquisitions, 2 frames shown (2 of 8)]
[im 1/4]
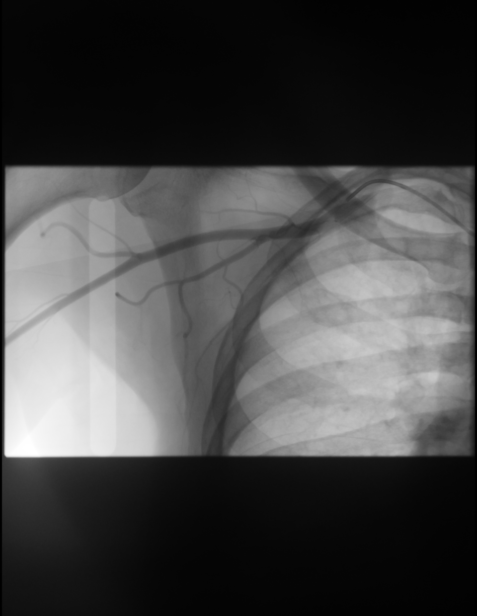
[im 2/4]
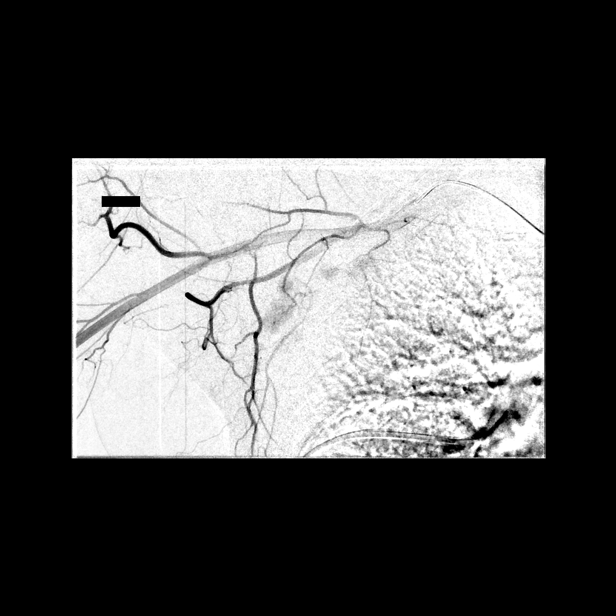

[Series 3: extr.4 care · 4 acquisitions, 1 frame shown (3 of 8)]
[im 2/4]
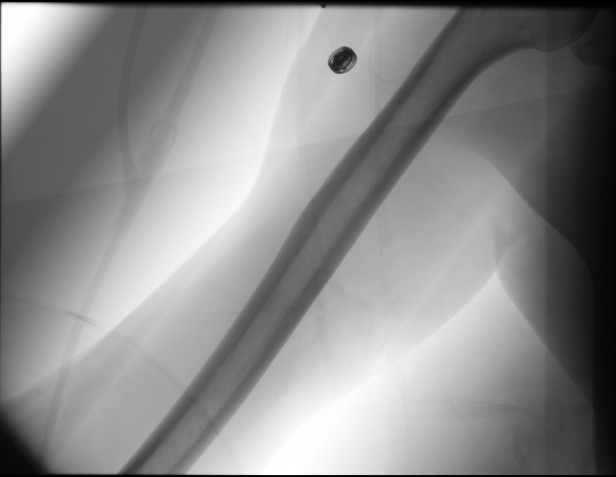

[Series 4: extr.4 care · 4 acquisitions, 2 frames shown (4 of 8)]
[im 1/4]
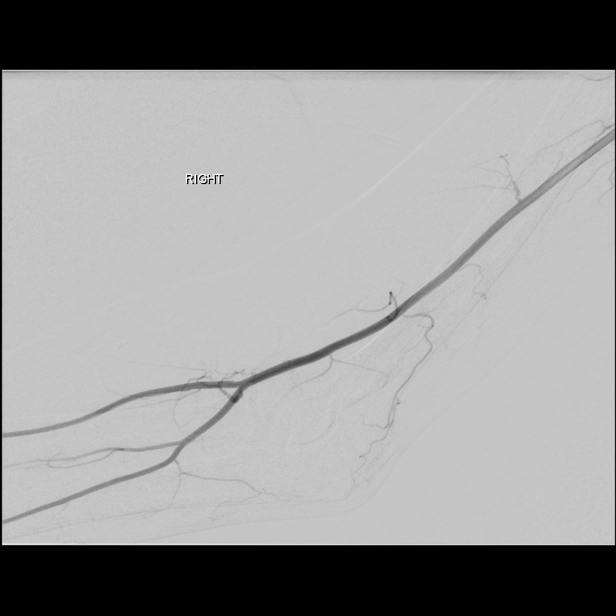
[im 2/4]
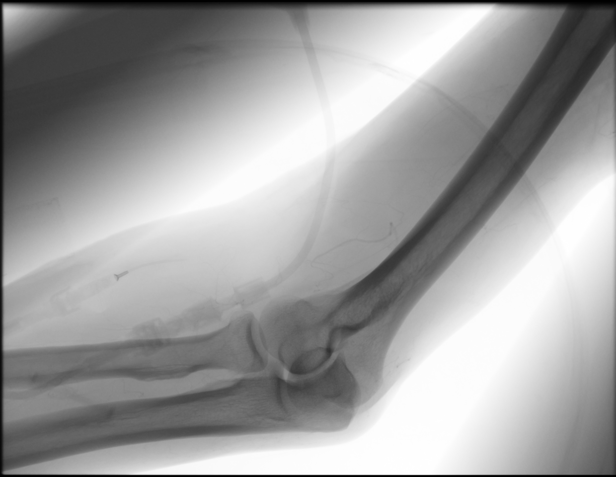

[Series 5: extr.4 care · 4 acquisitions, 1 frame shown (5 of 8)]
[im 2/4]
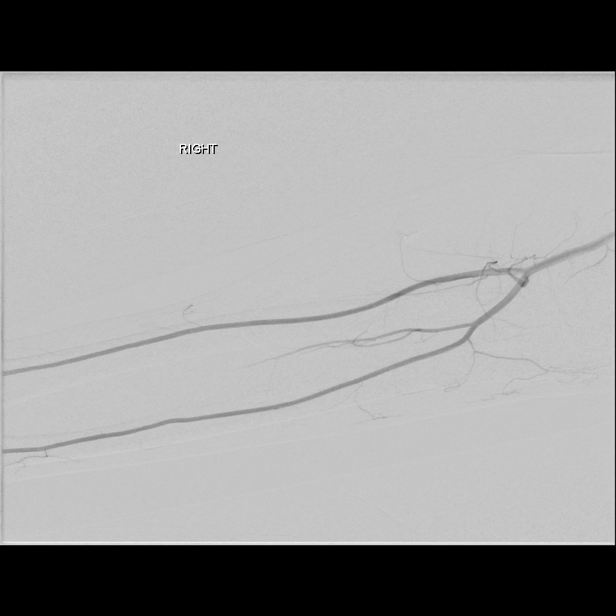

[Series 6: extr.4 care · 4 acquisitions, 2 frames shown (6 of 8)]
[im 1/4]
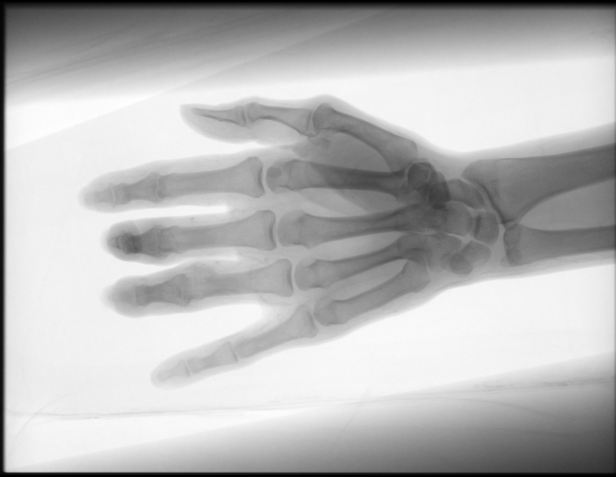
[im 4/4]
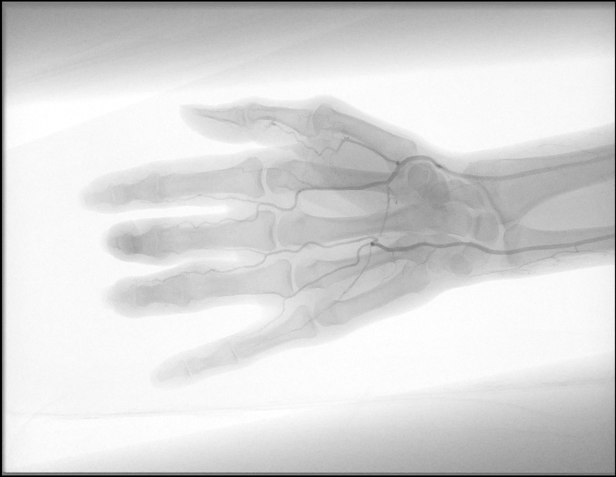

[Series 7: extr.4 care · 4 acquisitions, 1 frame shown (7 of 8)]
[im 2/4]
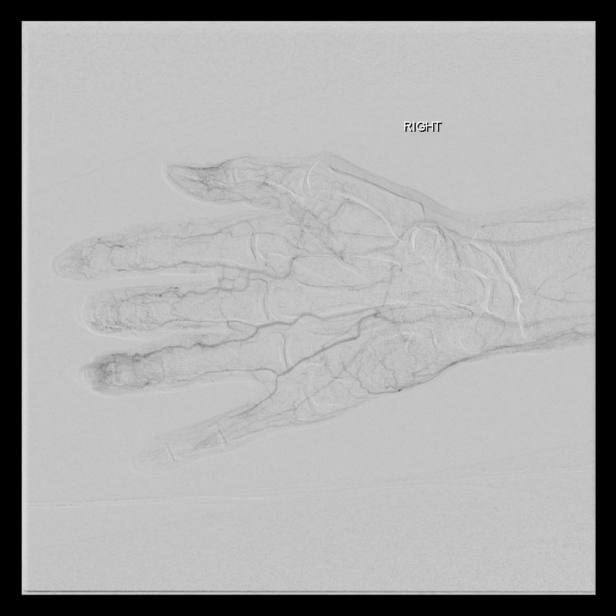

[Series 8: extr.4 care · 4 acquisitions, 2 frames shown (8 of 8)]
[im 1/4]
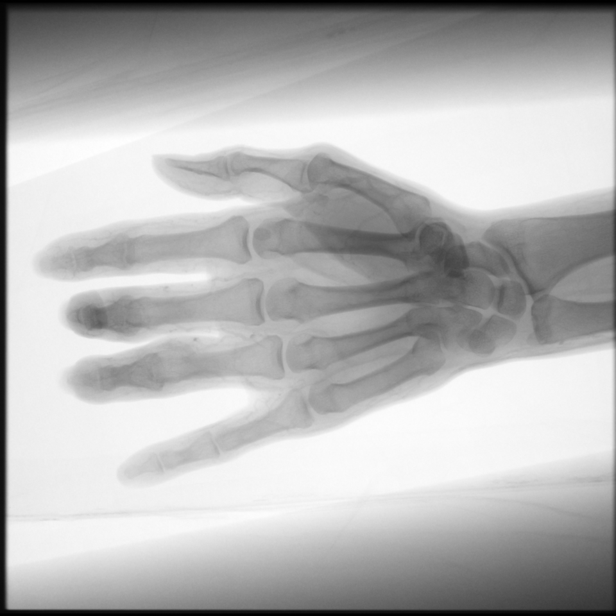
[im 4/4]
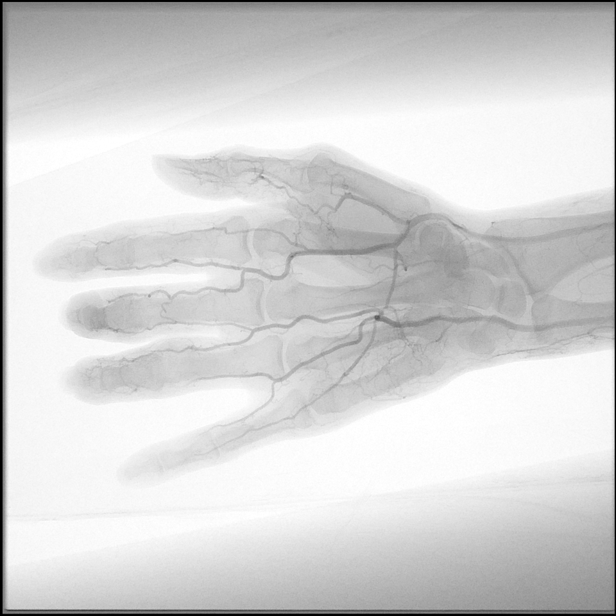

[12 of 24 positions shown; findings below may reference images not displayed]

EXAM:
ULTRASOUND-GUIDED ACCESS RIGHT COMMON FEMORAL ARTERY

RIGHT UPPER EXTREMITY ANGIOGRAM

HEMOSTASIS DEVICE DEPLOYMENT

MEDICATIONS:
100 mcg intra arterial nitroglycerin.

ANESTHESIA/SEDATION:
Moderate (conscious) sedation was employed during this procedure. A
total of Versed 0 mg and Fentanyl 25 mcg was administered
intravenously.

Moderate Sedation Time: 0 minutes. The patient's level of
consciousness and vital signs were monitored continuously by
radiology nursing throughout the procedure under my direct
supervision.

CONTRAST:  100mL VISIPAQUE IODIXANOL 320 MG/ML IV SOLN

FLUOROSCOPY TIME:  Fluoroscopy Time: 7 minutes 36 seconds (13 mGy).

COMPLICATIONS:
None



Ultrasound survey of the right inguinal region was performed with
images stored and sent to PACs, confirming patency of the vessel.

A micropuncture needle was used access the right common femoral
artery under ultrasound. With excellent arterial blood flow
returned, and an .018 micro wire was passed through the needle,
observed enter the abdominal aorta under fluoroscopy. The needle was
removed, and a micropuncture sheath was placed over the wire. The
inner dilator and wire were removed, and an 035 Bentson wire was
advanced under fluoroscopy into the abdominal aorta. The sheath was
removed and a standard 5 French vascular sheath was placed. The
dilator was removed and the sheath was flushed.

JB 1 catheter was advanced on the Bentson wire to the proximal
descending thoracic aorta. The wire was removed and a double flush
was performed.

The catheter was then used to engage the innominate artery.

Angiogram was performed

Glidewire was then used to navigate the wire to the distal right
subclavian artery.

Angiogram was performed.

Glidewire was then used to navigate the catheter into the axillary
artery.

Angiogram was then performed through the remainder of the right
upper extremity.

Angiogram of the hand arteries performed. 100 mcg nitroglycerin was
then administered, with 1 minutes observed and then repeat angiogram
performed.

With the catheter position in the proximal brachial artery/distal
axillary artery, an 014 whisper wire was advanced under fluoroscopy
to the distal brachial artery.

018 [employer] Opti-cross intravascular ultrasound device was
passed over the whisper wire to the aortic arch. At this point, we
discovered there was malfunction with the Opti cross catheter, with
some redundancy in the aortic arch with the wire and catheter
combination. Upon withdrawal of the whisper wire and the
intravascular ultrasound device, the malfunction of the catheter
persisted. Ultimately no intravascular ultrasound was performed.

The ultrasound and whisper wire were removed completely from the
system.

A CELT device was then performed for hemostasis through 5 French
sheath.

Patient tolerated the procedure well and remained hemodynamically
stable throughout.

No complications were encountered and no significant blood loss.
FINDINGS: Ultrasound demonstrates no significant plaque at the right common
femoral artery with no evidence of high bifurcation.

Angiogram of the innominate artery demonstrates no significant
tortuosity with normal course and caliber. No significant
atherosclerotic plaque of the innominate artery or right common
carotid artery. Right vertebral artery, internal mammary artery,
thyrocervical trunk and Salt cervical trunk are patent.

Angiogram of subclavian artery demonstrates normal course caliber
and contour without atherosclerotic plaque.

Axillary artery of normal course caliber and contour. Patent
circumflex humeral artery and lateral thoracic artery. No
atherosclerotic plaque of the axillary artery.

Angiogram of the brachial artery demonstrates normal course caliber
and contour. No atherosclerotic plaque

Brachial artery divides into radial artery and ulnar artery in the
antecubital region at the elbow. Interosseous artery has an origin
from the proximal ulnar artery.

Angiogram of ulnar artery and radial artery demonstrate normal
course caliber and contour with no atherosclerotic plaque.

Radial artery is patent to the hand. Normal course caliber and
contour with no aneurysm/ectasia.

Ulnar artery patent to the hand. No atherosclerotic plaque. No
ectasias/aneurysm.

Hand:

No complete palmar arch identified, with incomplete deep palmar arch
and no superficial palmar arch.

Attenuated metacarpal arteries, which are essentially
noncontributory secondary to small caliber.

Radio dorsal digital artery of the thumb is patent, with normal
course caliber and contour. Beyond the MCP, there is attenuation of
the digital arteries of the first digit, with tortuous course and
decreased caliber. The ulnar digital artery is occluded, with
circuitous perfusion via the patent radial digital artery.

The radial digital artery of the second digit is significantly
attenuated, with tortuosity in the proximal segment.

Although the caliber of the ulnar digital artery of the second digit
is larger with less circuitous course, there is acute cut off at the
distal phalanx of the ulnar digital artery with collateral flow
filling the distal radial digital artery.

Radial digital artery of the third digit is larger than the ulnar
artery, with circuitous course and occlusion at the distal segment
near the D IP.

The ulnar digital artery is of normal course though decreased
caliber, significantly attenuated distally.

Radial digital artery of the fourth digit is of relatively larger
caliber than the ulnar digital artery with mild tortuosity. The
radial digital artery perfuses the distal fourth digit, relative
hyper perfusion distally at the site of the ulcer.

The ulnar digital artery of the fourth digit is of decreased
caliber, with occlusion at the level of the PIP. Perfusion into the
attenuated distal radial digital artery is via collateral flow from
the opposing digital artery.

Both the radial and ulnar digital arteries of the fifth digit are
significantly attenuated in caliber, with no significant perfusion
past the mid segment before administration of nitroglycerin. After
nitroglycerin, there is slight improvement into the distal segment
at the D IP.
IMPRESSION: Status post ultrasound guided access right common femoral artery for
right upper extremity angiogram.

Findings reveal no significant atherosclerotic changes, with no
evidence to suggest atheroembolic phenomenon as the source of the
fourth digit ulcer, as the arteries are of normal course caliber and
contour to the wrist.

The angiographic findings of the right hand are most likely a
manifestation of either Antonietta disease (thrombo angiitis
obliterans) or other connective tissue disorder/rheumatologic
disorder. There was a mild improvement with administration of
nitroglycerin/vasodilators.
